# Patient Record
Sex: Female | Born: 1990 | Race: White | Hispanic: No | Smoking: Current some day smoker
Health system: Southern US, Community
[De-identification: ages and names within clinical notes are randomized; demographics above are authoritative.]

## PROBLEM LIST (undated history)

## (undated) DIAGNOSIS — R519 Headache, unspecified: Secondary | ICD-10-CM

## (undated) DIAGNOSIS — M1990 Unspecified osteoarthritis, unspecified site: Secondary | ICD-10-CM

## (undated) DIAGNOSIS — T7840XA Allergy, unspecified, initial encounter: Secondary | ICD-10-CM

## (undated) DIAGNOSIS — F419 Anxiety disorder, unspecified: Secondary | ICD-10-CM

## (undated) HISTORY — PX: WISDOM TOOTH EXTRACTION: SHX21

## (undated) HISTORY — DX: Anxiety disorder, unspecified: F41.9

## (undated) HISTORY — DX: Allergy, unspecified, initial encounter: T78.40XA

## (undated) HISTORY — PX: APPENDECTOMY: SHX54

## (undated) HISTORY — DX: Headache, unspecified: R51.9

## (undated) HISTORY — DX: Unspecified osteoarthritis, unspecified site: M19.90

---
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
## 2012-06-27 ENCOUNTER — Emergency Department: Payer: Self-pay | Admitting: Emergency Medicine

## 2012-12-10 DIAGNOSIS — O99330 Smoking (tobacco) complicating pregnancy, unspecified trimester: Secondary | ICD-10-CM | POA: Insufficient documentation

## 2012-12-10 DIAGNOSIS — Z3400 Encounter for supervision of normal first pregnancy, unspecified trimester: Secondary | ICD-10-CM | POA: Insufficient documentation

## 2012-12-10 DIAGNOSIS — F1290 Cannabis use, unspecified, uncomplicated: Secondary | ICD-10-CM | POA: Insufficient documentation

## 2016-01-06 LAB — HM HIV SCREENING LAB: HM HIV Screening: NEGATIVE

## 2016-01-17 LAB — HM PAP SMEAR: HM Pap smear: NEGATIVE

## 2016-05-02 ENCOUNTER — Encounter: Payer: Self-pay | Admitting: Emergency Medicine

## 2016-05-02 ENCOUNTER — Emergency Department
Admission: EM | Admit: 2016-05-02 | Discharge: 2016-05-02 | Disposition: A | Discharge status: Home / Self Care | Payer: Self-pay | Attending: Emergency Medicine | Admitting: Emergency Medicine

## 2016-05-02 ENCOUNTER — Emergency Department
Admission: EM | Admit: 2016-05-02 | Discharge: 2016-05-02 | Discharge status: Absent without leave | Payer: Medicaid Other

## 2016-05-02 DIAGNOSIS — R21 Rash and other nonspecific skin eruption: Secondary | ICD-10-CM | POA: Insufficient documentation

## 2016-05-02 LAB — POCT PREGNANCY, URINE: Preg Test, Ur: NEGATIVE

## 2016-05-02 MED ORDER — CLINDAMYCIN HCL 150 MG PO CAPS: ORAL_CAPSULE | ORAL | 0 refills | Status: DC

## 2016-05-02 MED ORDER — DEXAMETHASONE SODIUM PHOSPHATE 10 MG/ML IJ SOLN
10.0000 mg | Freq: Once | INTRAMUSCULAR | Status: AC
  Administered 2016-05-02: 10 mg via INTRAMUSCULAR
  Filled 2016-05-02: qty 1

## 2016-05-02 MED ORDER — DIPHENHYDRAMINE HCL 25 MG PO CAPS
50.0000 mg | ORAL_CAPSULE | Freq: Once | ORAL | Status: AC
  Administered 2016-05-02: 50 mg via ORAL
  Filled 2016-05-02: qty 2

## 2016-05-02 NOTE — Discharge Instructions (Signed)
Continue Benadryl one or 2 capsules every 6 hours as needed for itching. Begin clindamycin 2 capsules 4 times a day for 7 days. Follow-up with [facility] if any continued problems or continued rash.

## 2016-05-02 NOTE — ED Provider Notes (Signed)
[facility] Emergency Department Provider Note  ____________________________________________   None    (approximate)  I have reviewed the triage vital signs and the nursing notes.   HISTORY  Chief Complaint Rash    HPI Crystal Hansen is a 25 y.o. female is here complaining of rash to her forehead, right arm, and extremities. Patient denies any known contact with any new or unusual substances. Patient complains of itching and has been scratching at the areas without stopped. She is also concerned because the areas on her forehead have now swollen. One area on her wrist she has scratched until it is now also swollen. There is no drainage from these areas. Patient denies any fever or chills. She denies any difficulty breathing, swallowing or talking. Patient is not taking any over-the-counter medication prior to her arrival in the emergency room. She denies any pain at this time.   History reviewed. No pertinent past medical history.  There are no active problems to display for this patient.   History reviewed. No pertinent surgical history.  Prior to Admission medications   Medication Sig Start Date End Date Taking? Authorizing Provider  clindamycin (CLEOCIN) 150 MG capsule Take 2 capsules 4 times a day for 7 days. 05/02/16   Tommi Rumpshonda L Summers, PA-C    Allergies Amoxapine and related  History reviewed. No pertinent family history.  Social History Social History  Substance Use Topics  . Smoking status: Not on file  . Smokeless tobacco: Not on file  . Alcohol use Not on file    Review of Systems Constitutional: No fever/chills Eyes: No visual changes. ENT: No sore throat. Cardiovascular: Denies chest pain. Respiratory: Denies shortness of breath. Gastrointestinal:  No nausea, no vomiting.  Musculoskeletal: Negative for back pain.Negative for joint pain. Skin: Positive for rash. Neurological: Negative for headaches, focal weakness or  numbness.  10-point ROS otherwise negative.  ____________________________________________   PHYSICAL EXAM:  VITAL SIGNS: ED Triage Vitals [05/02/16 1050]  Enc Vitals Group     BP 118/63     Pulse Rate 100     Resp 18     Temp 98.1 F (36.7 C)     Temp Source Oral     SpO2 99 %     Weight 186 lb 6.4 oz (84.6 kg)     Height 5\' 5"  (1.651 m)     Head Circumference      Peak Flow      Pain Score 0     Pain Loc      Pain Edu?      Excl. in GC?     Constitutional: Alert and oriented. Well appearing and in no acute distress. Eyes: Conjunctivae are normal. PERRL. EOMI. Head: Atraumatic. Nose: No congestion/rhinnorhea. Mouth/Throat: Mucous membranes are moist.  Oropharynx non-erythematous.No edema present. Neck: No stridor.   Cardiovascular: Normal rate, regular rhythm. Grossly normal heart sounds.  Good peripheral circulation. Respiratory: Normal respiratory effort.  No retractions. Lungs CTAB. Musculoskeletal: No lower extremity tenderness nor edema.  No joint effusions. Neurologic:  Normal speech and language. No gross focal neurologic deficits are appreciated. No gait instability. Skin:  Skin is warm, dry. Multiple erythematous areas are present. There is involvement of the forehead with erythematous macular areas with no drainage. There are also circular macular areas on the extremities bilateral upper without involvement to the lower extremities. There is no drainage from any of these areas. No warmth or heat is noted. Right forearm is erythematous with macular area  open and minimal drainage present. Psychiatric: Mood and affect are normal. Speech and behavior are normal.  ____________________________________________   LABS (all labs ordered are listed, but only abnormal results are displayed)  Labs Reviewed  POC URINE PREG, ED  POCT PREGNANCY, URINE    PROCEDURES  Procedure(s) performed: None  Procedures  Critical Care performed:  No  ____________________________________________   INITIAL IMPRESSION / ASSESSMENT AND PLAN / ED COURSE  Pertinent labs & imaging results that were available during my care of the patient were reviewed by me and considered in my medical decision making (see chart for details).    Clinical Course   Patient was given Decadron 10 mg IM in the emergency room along with Benadryl 50 mg by mouth. Patient was also placed on clindamycin 2 capsules 4 times a day for 7 days as some of these areas appear to be early cellulitis secondary to patient's scratching at these areas. Patient is to follow-up with her primary care doctor or [facility] if any continued problems.  Skin eruption etiology is uncertain at this time.  ____________________________________________   FINAL CLINICAL IMPRESSION(S) / ED DIAGNOSES  Final diagnoses:  Rash and nonspecific skin eruption      NEW MEDICATIONS STARTED DURING THIS VISIT:  Discharge Medication List as of 05/02/2016 12:17 PM    START taking these medications   Details  clindamycin (CLEOCIN) 150 MG capsule Take 2 capsules 4 times a day for 7 days., Print         Note:  This document was prepared using Dragon voice recognition software and may include unintentional dictation errors.    Tommi Rumps, PA-C 05/02/16 1714    Myrna Blazer, MD 05/07/16 2110

## 2016-05-02 NOTE — ED Triage Notes (Addendum)
Patient from home via POV with complaint of rash to her forehead, right arm, BLE. Patient denies contact with new or unusual substances. Patient is actively scratching in triage

## 2017-12-06 LAB — HM HIV SCREENING LAB: HM HIV Screening: NEGATIVE

## 2019-02-15 DIAGNOSIS — F17200 Nicotine dependence, unspecified, uncomplicated: Secondary | ICD-10-CM | POA: Insufficient documentation

## 2019-02-20 ENCOUNTER — Other Ambulatory Visit: Payer: Self-pay

## 2019-02-20 ENCOUNTER — Encounter: Payer: Self-pay | Admitting: Family Medicine

## 2019-02-20 ENCOUNTER — Ambulatory Visit (INDEPENDENT_AMBULATORY_CARE_PROVIDER_SITE_OTHER): Payer: Self-pay | Admitting: Family Medicine

## 2019-02-20 VITALS — Ht 65.5 in | Wt 220.0 lb

## 2019-02-20 DIAGNOSIS — J3089 Other allergic rhinitis: Secondary | ICD-10-CM

## 2019-02-20 DIAGNOSIS — Z7689 Persons encountering health services in other specified circumstances: Secondary | ICD-10-CM

## 2019-02-20 DIAGNOSIS — F0781 Postconcussional syndrome: Secondary | ICD-10-CM

## 2019-02-20 MED ORDER — NORTRIPTYLINE HCL 25 MG PO CAPS: 25.0000 mg | ORAL_CAPSULE | Freq: Two times a day (BID) | ORAL | 0 refills | Status: DC

## 2019-02-20 MED ORDER — FLUTICASONE PROPIONATE 50 MCG/ACT NA SUSP: 1.0000 | Freq: Two times a day (BID) | NASAL | 11 refills | Status: DC

## 2019-02-20 MED ORDER — CETIRIZINE HCL 10 MG PO TABS: 10.0000 mg | ORAL_TABLET | Freq: Every day | ORAL | 11 refills | Status: DC
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
## 2019-02-23 DIAGNOSIS — J309 Allergic rhinitis, unspecified: Secondary | ICD-10-CM | POA: Insufficient documentation

## 2019-02-23 NOTE — Assessment & Plan Note (Signed)
Stable and well controlled with zyrtec and flonase regimen. Continue present medications

## 2019-02-27 ENCOUNTER — Ambulatory Visit (INDEPENDENT_AMBULATORY_CARE_PROVIDER_SITE_OTHER): Payer: Medicaid Other | Admitting: Family Medicine

## 2019-02-27 ENCOUNTER — Encounter: Payer: Self-pay | Admitting: Family Medicine

## 2019-02-27 DIAGNOSIS — F0781 Postconcussional syndrome: Secondary | ICD-10-CM

## 2019-02-27 MED ORDER — SUMATRIPTAN SUCCINATE 50 MG PO TABS: ORAL_TABLET | ORAL | 0 refills | Status: DC
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
## 2019-03-08 ENCOUNTER — Other Ambulatory Visit: Payer: Self-pay

## 2019-03-08 ENCOUNTER — Ambulatory Visit (INDEPENDENT_AMBULATORY_CARE_PROVIDER_SITE_OTHER): Payer: Self-pay | Admitting: Family Medicine

## 2019-03-08 ENCOUNTER — Encounter: Payer: Self-pay | Admitting: Family Medicine

## 2019-03-08 VITALS — BP 120/76 | HR 86 | Temp 97.7°F | Ht 65.5 in | Wt 267.0 lb

## 2019-03-08 DIAGNOSIS — F0781 Postconcussional syndrome: Secondary | ICD-10-CM

## 2019-03-08 NOTE — Progress Notes (Signed)
   BP 120/76   Pulse 86   Temp 97.7 F (36.5 C) (Oral)   Ht 5' 5.5" (1.664 m)   Wt 267 lb (121.1 kg)   SpO2 98%   BMI 43.76 kg/m    Subjective:    Patient ID: Crystal Hansen, female    DOB: 1991/02/01, 28 y.o.   MRN: [id number]  HPI: Crystal Hansen is a 28 y.o. female  Chief Complaint  Patient presents with  . Headache    f/u   Patient presents today for 1 week post-concussive HA f/u. Taking 50 mg nortriptyline at bedtime and imitrex prn which doesn't seem to be helping much. Was able to get in with Neurology this past week who agreed to continue current regimen. Imitrex was increased to 100 mg prn and she was instructed to continue complete mental rest until sxs are improving. Other than the pain, also still having some dizziness with exertion and light sensitivity. Denies N/V, syncope, confusion, memory issues.   Relevant past medical, surgical, family and social history reviewed and updated as indicated. Interim medical history since our last visit reviewed. Allergies and medications reviewed and updated.  Review of Systems  Per HPI unless specifically indicated above     Objective:    BP 120/76   Pulse 86   Temp 97.7 F (36.5 C) (Oral)   Ht 5' 5.5" (1.664 m)   Wt 267 lb (121.1 kg)   SpO2 98%   BMI 43.76 kg/m   Wt Readings from Last 3 Encounters:  03/08/19 267 lb (121.1 kg)  02/20/19 220 lb (99.8 kg)  05/02/16 186 lb 6.4 oz (84.6 kg)    Physical Exam Vitals signs and nursing note reviewed.  Constitutional:      Appearance: Normal appearance. She is not ill-appearing.  HENT:     Head: Atraumatic.  Eyes:     Extraocular Movements: Extraocular movements intact.     Conjunctiva/sclera: Conjunctivae normal.  Neck:     Musculoskeletal: Normal range of motion and neck supple.  Cardiovascular:     Rate and Rhythm: Normal rate and regular rhythm.     Heart sounds: Normal heart sounds.  Pulmonary:     Effort: Pulmonary effort is normal.     Breath  sounds: Normal breath sounds.  Musculoskeletal: Normal range of motion.  Skin:    General: Skin is warm and dry.  Neurological:     General: No focal deficit present.     Mental Status: She is alert and oriented to person, place, and time.  Psychiatric:        Mood and Affect: Mood normal.        Thought Content: Thought content normal.        Judgment: Judgment normal.     Results for orders placed or performed in visit on 02/20/19  HM HIV SCREENING LAB  Result Value Ref Range   HM HIV Screening Negative - Patient reported       Assessment & Plan:   Problem List Items Addressed This Visit    None    Visit Diagnoses    Post concussion syndrome    -  Primary   Now following with Neurology additionally. Continue current regimen, complete rest. extended work note provided. F/u in 2 weeks unless improved in meantime       Follow up plan: Return in about 2 weeks (around 03/22/2019) for HA f/u.

## 2019-03-20 ENCOUNTER — Other Ambulatory Visit: Payer: Self-pay

## 2019-03-20 ENCOUNTER — Ambulatory Visit (INDEPENDENT_AMBULATORY_CARE_PROVIDER_SITE_OTHER): Payer: Self-pay | Admitting: Family Medicine

## 2019-03-20 ENCOUNTER — Encounter: Payer: Self-pay | Admitting: Family Medicine

## 2019-03-20 DIAGNOSIS — F0781 Postconcussional syndrome: Secondary | ICD-10-CM

## 2019-03-20 MED ORDER — CYCLOBENZAPRINE HCL 10 MG PO TABS: 10.0000 mg | ORAL_TABLET | Freq: Three times a day (TID) | ORAL | 0 refills | Status: DC | PRN

## 2019-03-20 MED ORDER — MELOXICAM 15 MG PO TABS: 15.0000 mg | ORAL_TABLET | Freq: Every day | ORAL | 0 refills | Status: DC
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
## 2019-03-23 DIAGNOSIS — F0781 Postconcussional syndrome: Secondary | ICD-10-CM | POA: Insufficient documentation
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
## 2019-05-23 ENCOUNTER — Ambulatory Visit (LOCAL_COMMUNITY_HEALTH_CENTER): Payer: Medicaid Other | Admitting: Family Medicine

## 2019-05-23 ENCOUNTER — Other Ambulatory Visit: Payer: Self-pay

## 2019-05-23 VITALS — Ht 65.5 in | Wt 276.0 lb

## 2019-05-23 DIAGNOSIS — Z3046 Encounter for surveillance of implantable subdermal contraceptive: Secondary | ICD-10-CM | POA: Diagnosis not present

## 2019-05-23 DIAGNOSIS — Z3009 Encounter for other general counseling and advice on contraception: Secondary | ICD-10-CM

## 2019-05-23 NOTE — Progress Notes (Signed)
Here today for Nexplanon removal. Inserted in 01/2016 at [facility]. Not interested in alternate birth control at this time.  Hal Morales, RN

## 2019-05-23 NOTE — Progress Notes (Signed)
Family Planning Visit- Repeat Yearly Visit  Subjective:  Crystal Hansen is a 28 y.o. being seen today for an well woman visit and to discuss family planning options.    She is currently using Nexplanon for pregnancy prevention. Patient reports she does not want a pregnancy in the next year. Patient  has Allergic rhinitis and Post concussion syndrome on their problem list.  Chief Complaint  Patient presents with  . Procedure    Nexplanon removal    Does the patient desire a pregnancy in the next year? (OKQ flowsheet)  See flowsheet for other program required questions.   Body mass index is 45.23 kg/m. - Patient is eligible for diabetes screening based on BMI and age >52?  no HA1C ordered? not applicable  Patient reports 1 of partners in last year. Desires STI screening?  No - declines testing today  Does the patient have a current or past history of drug use? No   No components found for: HCV]   Health Maintenance Due  Topic Date Due  . PAP-Cervical Cytology Screening  01/17/2019  . PAP SMEAR-Modifier  01/17/2019  . INFLUENZA VACCINE  04/08/2019    Review of Systems  Constitutional: Negative for chills and fever.  Eyes: Negative for blurred vision and double vision.  Respiratory: Negative for cough and shortness of breath.   Cardiovascular: Negative for chest pain and orthopnea.  Gastrointestinal: Negative for nausea and vomiting.  Genitourinary: Negative for dysuria, flank pain and frequency.  Musculoskeletal: Negative for myalgias.  Skin: Negative for rash.  Neurological: Negative for dizziness, tingling, weakness and headaches.  Endo/Heme/Allergies: Does not bruise/bleed easily.  Psychiatric/Behavioral: Negative for depression and suicidal ideas. The patient is not nervous/anxious.     The following portions of the patient's history were reviewed and updated as appropriate: allergies, current medications, past family history, past medical history, past social  history, past surgical history and problem list. Problem list updated.  Objective:   Vitals:   05/23/19 1439  Weight: 276 lb (125.2 kg)  Height: 5' 5.5" (1.664 m)    Physical Exam Vitals signs and nursing note reviewed.  Constitutional:      Appearance: Normal appearance.  HENT:     Head: Normocephalic.  Pulmonary:     Effort: Pulmonary effort is normal.  Musculoskeletal: Normal range of motion.     Comments: Nexplanon felt in left upper arm prior to removal   Skin:    General: Skin is warm and dry.  Neurological:     Mental Status: She is alert. Mental status is at baseline.     Nexplanon Removal Patient identified, informed consent performed, consent signed.   Appropriate time out taken. Nexplanon site identified.  Area prepped in usual sterile fashon. 3 ml of 1% lidocaine with Epinephrine was used to anesthetize the area at the distal end of the implant and along implant site. A small stab incision was made right beside the implant on the distal portion.  The Nexplanon rod was grasped using hemostats and removed without difficulty.  There was minimal blood loss. There were no complications.  Steri-strips were applied over the small incision.  A pressure bandage was applied to reduce any bruising.  The patient tolerated the procedure well and was given post procedure instructions.     Assessment and Plan:  Crystal Hansen is a 28 y.o. female presenting to the [facility] Department for an initial well woman exam/family planning visit  Contraception counseling: Reviewed all forms of birth control  options available including abstinence; over the counter/barrier methods; hormonal contraceptive medication including pill, patch, ring, injection,contraceptive implant; hormonal and nonhormonal IUDs; permanent sterilization options including vasectomy and the various tubal sterilization modalities. Risks and benefits reviewed.  Questions were answered.  Written  information was also given to the patient to review.  Patient desires nothing- is ok with pregnancy if needed, this was prescribed for patient. She will follow up in  1 for surveillance.  She was told to call with any further questions, or with any concerns about this method of contraception.  Emphasized use of condoms 100% of the time for STI prevention.  1. Nexplanon removal Performed easily  2. Encounter for other general counseling or advice on contraception See above- reviewed services at [facility] [place] pap, encouraged visit for pap    Return in about 3 months (around 08/22/2019) for Yearly wellness exam, pap.  No future appointments.  Federico FlakeKimberly Niles Newton, MD

## 2019-09-06 ENCOUNTER — Ambulatory Visit: Payer: Medicaid Other

## 2019-09-06 ENCOUNTER — Other Ambulatory Visit: Payer: Self-pay

## 2019-09-06 VITALS — BP 128/76 | Ht 65.0 in | Wt 282.0 lb

## 2019-09-06 DIAGNOSIS — Z3201 Encounter for pregnancy test, result positive: Secondary | ICD-10-CM

## 2019-09-06 LAB — PREGNANCY, URINE: Preg Test, Ur: POSITIVE — AB

## 2019-09-06 MED ORDER — PRENATAL VITAMINS 28-0.8 MG PO TABS: 28.0000 mg | ORAL_TABLET | Freq: Every day | ORAL | 0 refills | Status: DC

## 2019-09-09 NOTE — Progress Notes (Signed)
Patient unsure where she will seek [facility].  Offered appt with [facility]; declined at this time. Local provider list given to patient; considering [facility] or [facility]. Richmond Campbell, RN

## 2019-09-11 NOTE — Progress Notes (Signed)
No [facility] Ameet Sandy, RN  

## 2019-10-24 ENCOUNTER — Encounter: Payer: Self-pay | Admitting: Obstetrics & Gynecology

## 2019-10-24 ENCOUNTER — Other Ambulatory Visit (HOSPITAL_COMMUNITY)
Admission: RE | Admit: 2019-10-24 | Discharge: 2019-10-24 | Disposition: A | Discharge status: Home / Self Care | Payer: Medicaid Other | Source: Ambulatory Visit | Attending: Obstetrics & Gynecology | Admitting: Obstetrics & Gynecology

## 2019-10-24 ENCOUNTER — Other Ambulatory Visit: Payer: Self-pay

## 2019-10-24 ENCOUNTER — Ambulatory Visit (INDEPENDENT_AMBULATORY_CARE_PROVIDER_SITE_OTHER): Payer: Medicaid Other | Admitting: Obstetrics & Gynecology

## 2019-10-24 VITALS — BP 128/80 | Wt 285.0 lb

## 2019-10-24 DIAGNOSIS — O0990 Supervision of high risk pregnancy, unspecified, unspecified trimester: Secondary | ICD-10-CM | POA: Insufficient documentation

## 2019-10-24 DIAGNOSIS — O99211 Obesity complicating pregnancy, first trimester: Secondary | ICD-10-CM | POA: Insufficient documentation

## 2019-10-24 DIAGNOSIS — Z124 Encounter for screening for malignant neoplasm of cervix: Secondary | ICD-10-CM | POA: Insufficient documentation

## 2019-10-24 DIAGNOSIS — Z131 Encounter for screening for diabetes mellitus: Secondary | ICD-10-CM

## 2019-10-24 DIAGNOSIS — O99212 Obesity complicating pregnancy, second trimester: Secondary | ICD-10-CM

## 2019-10-24 DIAGNOSIS — Z1379 Encounter for other screening for genetic and chromosomal anomalies: Secondary | ICD-10-CM

## 2019-10-24 DIAGNOSIS — Z3A13 13 weeks gestation of pregnancy: Secondary | ICD-10-CM | POA: Diagnosis not present

## 2019-10-24 DIAGNOSIS — O09892 Supervision of other high risk pregnancies, second trimester: Secondary | ICD-10-CM

## 2019-10-24 MED ORDER — PRENATAL VITAMINS 28-0.8 MG PO TABS: 28.0000 mg | ORAL_TABLET | Freq: Every day | ORAL | 6 refills | Status: AC

## 2019-10-24 NOTE — Patient Instructions (Signed)

## 2019-10-24 NOTE — Progress Notes (Signed)
10/24/2019   Chief Complaint: Missed period  Transfer of Care Patient: Crystal Hansen, no visit there, late onset to care  History of Present Illness: Crystal Hansen is a 29 y.o. G2P1001 [redacted]w[redacted]d based on Patient's last menstrual period was 07/23/2019 (exact date). with an Estimated Date of Delivery: 04/28/20, with the above CC.   Her periods were: regular periods every 28 days after Nexplanon removed last summer She was using no method when she conceived.  She has Positive signs or symptoms of nausea/vomiting of Hansen. She has Negative signs or symptoms of miscarriage or preterm labor She identifies Negative Zika risk factors for her and her partner On any different medications around the time she conceived/early Hansen: Yes  History of varicella: Yes   ROS: A 12-point review of systems was performed and negative, except as stated in the above HPI.  OBGYN History: As per HPI. OB History  Gravida Para Term Preterm AB Living  2 1 1     1   SAB TAB Ectopic Multiple Live Births               # Outcome Date GA Lbr Len/2nd Weight Sex Delivery Anes PTL Lv  2 Current           1 Term             Any issues with any prior pregnancies: no Any prior children are healthy, doing well, without any problems or issues: yes, NSVD after post dates IOL 7 yrs ago History of pap smears: Yes. Last pap smear unknown by pt. Abnormal: no  History of STIs: No   Past Medical History: Past Medical History:  Diagnosis Date  . Allergy   . Anxiety   . Arthritis   . Headache     Past Surgical History: Past Surgical History:  Procedure Laterality Date  . APPENDECTOMY      Family History:  Family History  Problem Relation Age of Onset  . Hernia Father   . Hypertension Father   . Lung cancer Maternal Grandmother   . Heart disease Maternal Grandfather   . Diabetes Maternal Grandfather   . Hypertension Maternal Grandfather   . Anemia Paternal Grandmother   . Prostate cancer  Paternal Grandfather    She denies any female cancers, bleeding or blood clotting disorders.  She denies any history of mental retardation, birth defects or genetic disorders in her or the FOB's history  Social History:  Social History   Socioeconomic History  . Marital status: Single    Spouse name: Not on file  . Number of children: Not on file  . Years of education: Not on file  . Highest education level: Not on file  Occupational History  . Not on file  Tobacco Use  . Smoking status: Former Smoker    Packs/day: 0.50    Types: Cigarettes  . Smokeless tobacco: Never Used  Substance and Sexual Activity  . Alcohol use: Not Currently    Comment: on occasion  . Drug use: Never  . Sexual activity: Yes  Other Topics Concern  . Not on file  Social History Narrative  . Not on file   Social Determinants of Health   Financial Resource Strain:   . Difficulty of Paying Living Expenses: Not on file  Food Insecurity:   . Worried About in the Last Year: Not on file  . Ran Out of Food in the Last Year: Not on file  Transportation Needs:   .  Lack of Transportation (Medical): Not on file  . Lack of Transportation (Non-Medical): Not on file  Physical Activity:   . Days of Exercise per Week: Not on file  . Minutes of Exercise per Session: Not on file  Stress:   . Feeling of Stress : Not on file  Social Connections:   . Frequency of Communication with Friends and Family: Not on file  . Frequency of Social Gatherings with Friends and Family: Not on file  . Attends Religious Services: Not on file  . Active Member of Clubs or Organizations: Not on file  . Attends Archivist Meetings: Not on file  . Marital Status: Not on file  Intimate Partner Violence:   . Fear of Current or Ex-Partner: Not on file  . Emotionally Abused: Not on file  . Physically Abused: Not on file  . Sexually Abused: Not on file   Any pets in the household:  no  Allergy: Allergies  Allergen Reactions  . Peanut Oil Anaphylaxis  . Shellfish Allergy Anaphylaxis  . Amoxapine And Related   . Apple Other (See Comments)  . Amoxicillin Rash    Current Outpatient Medications:  Current Outpatient Medications:  .  Butalbital-APAP-Caffeine 50-300-40 MG CAPS, Take by mouth., Disp: , Rfl:  .  Prenatal Vit-Fe Fumarate-FA (PRENATAL VITAMINS) 28-0.8 MG TABS, Take 28 mg by mouth daily., Disp: 90 tablet, Rfl: 6   Physical Exam:   BP 128/80   Wt 285 lb (129.3 kg)   LMP 07/23/2019 (Exact Date)   BMI 47.43 kg/m  Body mass index is 47.43 kg/m. Constitutional: Well nourished, well developed female in no acute distress.  Neck:  Supple, normal appearance, and no thyromegaly  Cardiovascular: S1, S2 normal, no murmur, rub or gallop, regular rate and rhythm Respiratory:  Clear to auscultation bilateral. Normal respiratory effort Abdomen: positive bowel sounds and no masses, hernias; diffusely non tender to palpation, non distended Breasts: breasts appear normal, no suspicious masses, no skin or nipple changes or axillary nodes. Neuro/Psych:  Normal mood and affect.  Skin:  Warm and dry.  Lymphatic:  No inguinal lymphadenopathy.  FHT 150s   Pelvic exam: is limited by body habitus EGBUS: within normal limits, Vagina: within normal limits and with no blood in the vault, Cervix: normal appearing cervix without discharge or lesions, closed/long/high, Uterus:  enlarged: 12 weeks, and Adnexa:  normal adnexa and no mass, fullness, tenderness  Assessment: Crystal Hansen is a 29 y.o. G2P1001 [redacted]w[redacted]d based on Patient's last menstrual period was 07/23/2019 (exact date). with an Estimated Date of Delivery: 04/28/20,  for prenatal care.  Plan:  1) Avoid alcoholic beverages. 2) Patient encouraged not to smoke.  3) Discontinue the use of all non-medicinal drugs and chemicals.  4) Take prenatal vitamins daily.  5) Seatbelt use advised 6) Nutrition, food safety (fish,  cheese advisories, and high nitrite foods) and exercise discussed. 7) Hospital and practice style delivering at [facility] discussed  8) Patient is asked about travel to areas at risk for the [place] virus, and counseled to avoid travel and exposure to mosquitoes or sexual partners who may have themselves been exposed to the virus. Testing is discussed, and will be ordered as appropriate.  9) Childbirth classes at [facility] advised 10) Genetic Screening, such as with 1st Trimester Screening, cell free fetal DNA, AFP testing, and Ultrasound, as well as with amniocentesis and [facility] as appropriate, is discussed with patient. She plans to have genetic testing this Hansen. 11) Obesity RF discussed Glucola, [place] soon  Problem list reviewed and updated.  Annamarie Major, MD, [facility], [facility] 10/24/2019  8:29 AM

## 2019-10-25 ENCOUNTER — Other Ambulatory Visit: Payer: Self-pay

## 2019-10-25 ENCOUNTER — Ambulatory Visit (INDEPENDENT_AMBULATORY_CARE_PROVIDER_SITE_OTHER): Payer: Medicaid Other | Admitting: Obstetrics & Gynecology

## 2019-10-25 ENCOUNTER — Encounter: Payer: Self-pay | Admitting: Obstetrics & Gynecology

## 2019-10-25 ENCOUNTER — Other Ambulatory Visit: Payer: Self-pay | Admitting: Obstetrics & Gynecology

## 2019-10-25 ENCOUNTER — Ambulatory Visit (INDEPENDENT_AMBULATORY_CARE_PROVIDER_SITE_OTHER): Payer: Medicaid Other

## 2019-10-25 ENCOUNTER — Other Ambulatory Visit: Payer: Medicaid Other

## 2019-10-25 VITALS — BP 140/80 | Wt 284.0 lb

## 2019-10-25 DIAGNOSIS — O30042 Twin pregnancy, dichorionic/diamniotic, second trimester: Secondary | ICD-10-CM | POA: Diagnosis not present

## 2019-10-25 DIAGNOSIS — O30049 Twin pregnancy, dichorionic/diamniotic, unspecified trimester: Secondary | ICD-10-CM

## 2019-10-25 DIAGNOSIS — O99211 Obesity complicating pregnancy, first trimester: Secondary | ICD-10-CM

## 2019-10-25 DIAGNOSIS — Z3A13 13 weeks gestation of pregnancy: Secondary | ICD-10-CM | POA: Diagnosis not present

## 2019-10-25 DIAGNOSIS — Z131 Encounter for screening for diabetes mellitus: Secondary | ICD-10-CM

## 2019-10-25 DIAGNOSIS — O0990 Supervision of high risk pregnancy, unspecified, unspecified trimester: Secondary | ICD-10-CM

## 2019-10-25 DIAGNOSIS — O09891 Supervision of other high risk pregnancies, first trimester: Secondary | ICD-10-CM

## 2019-10-25 DIAGNOSIS — Z1379 Encounter for other screening for genetic and chromosomal anomalies: Secondary | ICD-10-CM

## 2019-10-25 DIAGNOSIS — O30041 Twin pregnancy, dichorionic/diamniotic, first trimester: Secondary | ICD-10-CM

## 2019-10-25 LAB — CYTOLOGY - PAP
Chlamydia: NEGATIVE
Comment: NEGATIVE
Comment: NORMAL
Diagnosis: NEGATIVE
Neisseria Gonorrhea: NEGATIVE

## 2019-10-25 NOTE — Patient Instructions (Signed)
National Guideline Alliance ([place]).Twin and Triplet Pregnancy. [place]: National Institute for Health and Care Excellence (UK); 2019.">  Multiple Pregnancy Multiple pregnancy means that a woman is carrying more than one baby at a time. She may be pregnant with twins, triplets, or more. The majority of multiple pregnancies are twins. Naturally conceiving triplets or more (higher-order multiples) is rare. Multiple pregnancies are riskier than single pregnancies. A woman with a multiple pregnancy is more likely to have certain problems during her pregnancy. How does a multiple pregnancy happen? A multiple pregnancy happens when:  The woman's body releases more than one egg at a time, and then each egg gets fertilized by a different sperm. ? This is the most common type of multiple pregnancy. ? Twins or other multiples produced this way are called fraternal. They are no more alike than non-multiple siblings are.  One sperm fertilizes one egg, which then divides into more than one embryo. ? Twins or other multiples produced this way are called identical. Identical multiples are always the same gender, and they look very much alike. Who is most likely to have a multiple pregnancy? A multiple pregnancy is more likely to develop in women who:  Have had fertility treatment, especially if the treatment included fertility medicines.  Are older than 28 years of age.  Have already had four or more children.  Have a family history of multiple pregnancy. How is a multiple pregnancy diagnosed? A multiple pregnancy may be diagnosed based on:  Symptoms such as: ? Rapid weight gain in the first 3 months of pregnancy (first trimester). ? More severe nausea and breast tenderness than what is typical of a single pregnancy. ? A larger uterus than what is normal for the stage of the pregnancy.  Blood tests that detect a higher-than-normal level of human chorionic gonadotropin (hCG). This is a hormone that your  body produces in early pregnancy.  An ultrasound exam. This is used to confirm that you are carrying multiples. What risks come with multiple pregnancy? A multiple pregnancy puts you at a higher risk for certain problems during or after your pregnancy. These include:  Delivering your babies before your due date (preterm birth). A full-term pregnancy lasts for at least 37 weeks. ? Babies born before 37 weeks may have a higher risk for breathing problems, feeding difficulties, cerebral palsy, and learning disabilities.  Diabetes.  Preeclampsia. This is a serious condition that causes high blood pressure and headaches during pregnancy.  Too much blood loss after childbirth (postpartum hemorrhage).  Postpartum depression.  Low birth weight of the babies. How will having a multiple pregnancy affect my care? Your health care team will monitor you more closely. You may need more frequent prenatal visits. This will ensure that you are healthy and that your babies are growing normally. Follow these instructions at home: Eating and drinking  Increase your nutrition. ? Follow your health care provider's recommendations for weight gain. You may need to gain a little extra weight when you are pregnant with multiples. ? Eat healthy snacks often throughout the day. This will add calories and reduce nausea.  Drink enough fluid to keep your urine pale yellow.  Take prenatal vitamins. Ask your health care provider what vitamins are right for you. Activity Limit your activities by 20-24 weeks of pregnancy.  Rest often.  Avoid activities, exercise, and work that take a lot of effort.  Ask your health care provider when you should stop having sex. General instructions  Do not use any   products that contain nicotine or tobacco, such as cigarettes, e-cigarettes, and chewing tobacco. If you need help quitting, ask your health care provider.  Do not drink alcohol or use illegal drugs.  Take  over-the-counter and prescription medicines only as told by your health care provider.  Arrange for extra help around the house.  Keep all follow-up visits and all prenatal visits as told by your health care provider. This is important. Where to find more information  American College of Obstetricians and Gynecology: www.acog.org Contact a health care provider if:  You have dizziness.  You have nausea, vomiting, or diarrhea that does not go away.  You have depression or other emotions that are interfering with your normal activities.  You have a fever.  You have pain with urination.  You have a bad-smelling vaginal discharge.  You notice increased swelling in your face, hands, legs, or ankles. Get help right away if:  You have fluid leaking from your vagina.  You have bleeding from your vagina.  You have pelvic cramps, pelvic pressure, or nagging pain in your abdomen or lower back.  You are having regular contractions.  You have a severe headache, with or without changes in how you see.  You have chest pain or shortness of breath.  You notice that your babies move less often, or do not move at all. Summary  Having a multiple pregnancy means that a woman is carrying more than one baby at a time.  A multiple pregnancy puts you at a higher risk for delivering your babies before your due date, having diabetes, preeclampsia, too much blood loss after childbirth, or low birth weight of the babies.  Your health care provider will monitor you more closely during your pregnancy.  You may need to make some lifestyle changes during pregnancy. This includes eating more, limiting your activities after 20-24 weeks of pregnancy, and arranging for extra help around the house.  Follow up with your health care provider as instructed if you experience any complications. This information is not intended to replace advice given to you by your health care provider. Make sure you discuss  any questions you have with your health care provider. Document Revised: 04/17/2019 Document Reviewed: 04/17/2019 Elsevier Patient Education  2020 Elsevier Inc.  

## 2019-10-25 NOTE — Progress Notes (Signed)
  Subjective  Fetal Movement? no Nausea? no Vaginal Bleeding? no Review of ULTRASOUND.    I have personally reviewed images and report of recent ultrasound done at [facility].    Plan of management to be discussed with patient. TWINS dx today by [place], di-di  Objective  BP 140/80   Wt 284 lb (128.8 kg)   LMP 07/23/2019 (Exact Date)   BMI 47.26 kg/m  General: NAD Pumonary: no increased work of breathing Abdomen: gravid, non-tender Extremities: no edema Psychiatric: mood appropriate, affect full  Assessment  29 y.o. G2P1001 at [redacted]w[redacted]d by  04/28/2020, by Last Menstrual Period presenting for routine prenatal visit  Plan   Problem List Items Addressed This Visit      Other   Supervision of high risk pregnancy, antepartum   Obesity affecting pregnancy in first trimester   Relevant Orders   EKG   Twin dichorionic diamniotic placenta    Discussed risk factors and prenatal care surveillance    NIPT done, discussed limitations w twins   [redacted] weeks gestation of pregnancy        -  PNV     Annamarie Major, MD, [facility], [facility] 10/25/2019  11:21 AM

## 2019-10-26 ENCOUNTER — Telehealth: Payer: Self-pay | Admitting: Obstetrics & Gynecology

## 2019-10-26 LAB — RPR+RH+ABO+RUB AB+AB SCR+CB...
Antibody Screen: NEGATIVE
HIV Screen 4th Generation wRfx: NONREACTIVE
Hematocrit: 36 % (ref 34.0–46.6)
Hemoglobin: 12.5 g/dL (ref 11.1–15.9)
Hepatitis B Surface Ag: NEGATIVE
MCH: 28.7 pg (ref 26.6–33.0)
MCHC: 34.7 g/dL (ref 31.5–35.7)
MCV: 83 fL (ref 79–97)
Platelets: 310 10*3/uL (ref 150–450)
RBC: 4.35 x10E6/uL (ref 3.77–5.28)
RDW: 12.8 % (ref 11.7–15.4)
RPR Ser Ql: NONREACTIVE
Rh Factor: POSITIVE
Rubella Antibodies, IGG: <0.9 index — ABNORMAL LOW (ref >0.99)
Varicella zoster IgG: 1010 index (ref >165)
WBC: 12 10*3/uL — ABNORMAL HIGH (ref 3.4–10.8)

## 2019-10-26 LAB — URINE CULTURE

## 2019-10-26 LAB — GLUCOSE, 1 HOUR GESTATIONAL: Gestational Diabetes Screen: 96 mg/dL (ref 65–139)

## 2019-10-26 NOTE — Telephone Encounter (Signed)
-----   Message from Lewis Moccasin sent at 10/25/2019  3:18 PM EST ----- Regarding: FW: sch EKG Please schedule the EKG for this patient. ----- Message ----- From: Nadara Mustard, MD Sent: 10/25/2019  11:13 AM EST To: Lewis Moccasin, April H Hawley Subject: sch EKG

## 2019-10-26 NOTE — Telephone Encounter (Signed)
Called Specialty scheduling over at [facility] had to leave a voicemail for them to call me back to get patient schedule

## 2019-10-27 ENCOUNTER — Telehealth: Payer: Self-pay | Admitting: Obstetrics & Gynecology

## 2019-10-27 NOTE — Telephone Encounter (Signed)
Patient is aware EKG schedule at [facility] on Monday, 10/30/19 at 10 am

## 2019-10-27 NOTE — Telephone Encounter (Signed)
-----   Message from Lewis Moccasin sent at 10/26/2019  4:21 PM EST ----- Regarding: FW: sch EKG Please schedule the patient's EKG. Thanks. ----- Message ----- From: Nadara Mustard, MD Sent: 10/25/2019  11:13 AM EST To: Lewis Moccasin, April H Hawley Subject: sch EKG

## 2019-10-30 ENCOUNTER — Other Ambulatory Visit: Payer: Self-pay

## 2019-10-30 ENCOUNTER — Ambulatory Visit
Admission: RE | Admit: 2019-10-30 | Discharge: 2019-10-30 | Disposition: A | Discharge status: Home / Self Care | Payer: Medicaid Other | Source: Ambulatory Visit | Attending: Obstetrics & Gynecology | Admitting: Obstetrics & Gynecology

## 2019-10-30 DIAGNOSIS — O99211 Obesity complicating pregnancy, first trimester: Secondary | ICD-10-CM | POA: Insufficient documentation

## 2019-10-30 DIAGNOSIS — Z3A00 Weeks of gestation of pregnancy not specified: Secondary | ICD-10-CM | POA: Insufficient documentation

## 2019-10-30 DIAGNOSIS — E669 Obesity, unspecified: Secondary | ICD-10-CM | POA: Diagnosis not present

## 2019-11-08 ENCOUNTER — Other Ambulatory Visit: Payer: Self-pay | Admitting: Obstetrics & Gynecology

## 2019-11-08 DIAGNOSIS — Z1379 Encounter for other screening for genetic and chromosomal anomalies: Secondary | ICD-10-CM

## 2019-11-09 ENCOUNTER — Other Ambulatory Visit: Payer: Self-pay

## 2019-11-09 ENCOUNTER — Ambulatory Visit (INDEPENDENT_AMBULATORY_CARE_PROVIDER_SITE_OTHER): Payer: Medicaid Other | Admitting: Certified Nurse Midwife

## 2019-11-09 VITALS — BP 118/64 | Wt 287.0 lb

## 2019-11-09 DIAGNOSIS — O30049 Twin pregnancy, dichorionic/diamniotic, unspecified trimester: Secondary | ICD-10-CM

## 2019-11-09 DIAGNOSIS — O0992 Supervision of high risk pregnancy, unspecified, second trimester: Secondary | ICD-10-CM

## 2019-11-09 DIAGNOSIS — Z3A15 15 weeks gestation of pregnancy: Secondary | ICD-10-CM

## 2019-11-09 DIAGNOSIS — O0990 Supervision of high risk pregnancy, unspecified, unspecified trimester: Secondary | ICD-10-CM

## 2019-11-09 DIAGNOSIS — Z1379 Encounter for other screening for genetic and chromosomal anomalies: Secondary | ICD-10-CM

## 2019-11-09 DIAGNOSIS — O30042 Twin pregnancy, dichorionic/diamniotic, second trimester: Secondary | ICD-10-CM

## 2019-11-09 LAB — POCT URINALYSIS DIPSTICK OB
Glucose, UA: NEGATIVE
POC,PROTEIN,UA: NEGATIVE

## 2019-11-09 NOTE — Progress Notes (Signed)
C/o feet swelling - adv good supportive tennis shoes, drink water, watch salt intake; needs MaterniT 21 redrawn d/t twins.rj

## 2019-11-10 ENCOUNTER — Other Ambulatory Visit: Payer: Self-pay | Admitting: Obstetrics & Gynecology

## 2019-11-14 LAB — MATERNIT 21 PLUS CORE, BLOOD
Fetal Fraction: 5
Result (T21): NEGATIVE
Trisomy 13 (Patau syndrome): NEGATIVE
Trisomy 18 (Edwards syndrome): NEGATIVE
Trisomy 21 (Down syndrome): NEGATIVE

## 2019-11-20 ENCOUNTER — Telehealth: Payer: Self-pay

## 2019-11-20 NOTE — Telephone Encounter (Signed)
Rep from [facility] left msg on triage line wanting to confirm we had received fax redraw request for pt. Called number given and was advised by lab rep that they dont see anything pending for this request, the most recent results they have are from second draw in March. I was told they apologize for the confusion and to disregard msg.

## 2019-11-26 LAB — MATERNIT21 PLUS CORE+SCA
Fetal Fraction: 5
Monosomy X (Turner Syndrome): NOT DETECTED
Trisomy 13 (Patau syndrome): NEGATIVE
Trisomy 18 (Edwards syndrome): NEGATIVE
Trisomy 21 (Down syndrome): NEGATIVE
XXX (Triple X Syndrome): NOT DETECTED
XXY (Klinefelter Syndrome): NOT DETECTED
XYY (Jacobs Syndrome): NOT DETECTED

## 2019-12-05 ENCOUNTER — Encounter: Payer: Self-pay | Admitting: Certified Nurse Midwife

## 2019-12-05 NOTE — Progress Notes (Signed)
Twin gestation at 15wk4d: Doing well. Needs another MaterniT 21 drawn reflecting twin gestation. Ultrasound today: Both fetuses moving well and normal FCA 147 and 144 NOB labs reviewed Early glucola was normal (96) A: Twin gestation at 15wk4d  P: MaterniT 21 drawn ROB in 4 weeks for anatomy scan on twins  Farrel Conners, [place]

## 2019-12-11 ENCOUNTER — Ambulatory Visit (INDEPENDENT_AMBULATORY_CARE_PROVIDER_SITE_OTHER): Payer: Medicaid Other

## 2019-12-11 ENCOUNTER — Ambulatory Visit (INDEPENDENT_AMBULATORY_CARE_PROVIDER_SITE_OTHER): Payer: Medicaid Other | Admitting: Obstetrics and Gynecology

## 2019-12-11 ENCOUNTER — Other Ambulatory Visit: Payer: Self-pay | Admitting: Obstetrics and Gynecology

## 2019-12-11 ENCOUNTER — Other Ambulatory Visit: Payer: Self-pay

## 2019-12-11 ENCOUNTER — Encounter: Payer: Self-pay | Admitting: Obstetrics and Gynecology

## 2019-12-11 VITALS — BP 120/80 | Wt 293.0 lb

## 2019-12-11 DIAGNOSIS — Z3A20 20 weeks gestation of pregnancy: Secondary | ICD-10-CM | POA: Diagnosis not present

## 2019-12-11 DIAGNOSIS — Z3689 Encounter for other specified antenatal screening: Secondary | ICD-10-CM

## 2019-12-11 DIAGNOSIS — O99212 Obesity complicating pregnancy, second trimester: Secondary | ICD-10-CM

## 2019-12-11 DIAGNOSIS — O30049 Twin pregnancy, dichorionic/diamniotic, unspecified trimester: Secondary | ICD-10-CM

## 2019-12-11 DIAGNOSIS — O30009 Twin pregnancy, unspecified number of placenta and unspecified number of amniotic sacs, unspecified trimester: Secondary | ICD-10-CM

## 2019-12-11 DIAGNOSIS — O0990 Supervision of high risk pregnancy, unspecified, unspecified trimester: Secondary | ICD-10-CM

## 2019-12-11 DIAGNOSIS — O99211 Obesity complicating pregnancy, first trimester: Secondary | ICD-10-CM

## 2019-12-11 DIAGNOSIS — O30042 Twin pregnancy, dichorionic/diamniotic, second trimester: Secondary | ICD-10-CM

## 2019-12-11 DIAGNOSIS — O0992 Supervision of high risk pregnancy, unspecified, second trimester: Secondary | ICD-10-CM

## 2019-12-11 LAB — POCT URINALYSIS DIPSTICK OB
Glucose, UA: NEGATIVE
POC,PROTEIN,UA: NEGATIVE

## 2019-12-11 MED ORDER — PRENATAL VITAMINS 28-0.8 MG PO TABS: 1.0000 | ORAL_TABLET | Freq: Every day | ORAL | 11 refills | Status: DC

## 2019-12-11 NOTE — Progress Notes (Signed)
Routine Prenatal Care Visit  Subjective  Crystal Hansen is a 29 y.o. G2P1001 at [redacted]w[redacted]d being seen today for ongoing prenatal care.  She is currently monitored for the following issues for this high-risk pregnancy and has Allergic rhinitis; Post concussion syndrome; Supervision of high risk pregnancy, antepartum; Obesity affecting pregnancy in first trimester; and Twin dichorionic diamniotic placenta on their problem list.  ----------------------------------------------------------------------------------- Patient reports no complaints.   Contractions: Irregular. Vag. Bleeding: None.  Movement: Present. Denies leaking of fluid.  ----------------------------------------------------------------------------------- The following portions of the patient's history were reviewed and updated as appropriate: allergies, current medications, past family history, past medical history, past social history, past surgical history and problem list. Problem list updated.   Objective  Blood pressure 120/80, weight 293 lb (132.9 kg), last menstrual period 07/23/2019. Pregravid weight 260 lb (117.9 kg) Total Weight Gain 33 lb (15 kg) Urinalysis:      Fetal Status: Fetal Heart Rate (bpm): 137/142   Movement: Present     General:  Alert, oriented and cooperative. Patient is in no acute distress.  Skin: Skin is warm and dry. No rash noted.   Cardiovascular: Normal heart rate noted  Respiratory: Normal respiratory effort, no problems with respiration noted  Abdomen: Soft, gravid, appropriate for gestational age. Pain/Pressure: Absent     Pelvic:  Cervical exam deferred        Extremities: Normal range of motion.     Mental Status: Normal mood and affect. Normal behavior. Normal judgment and thought content.    Assessment   29 y.o. G2P1001 at [redacted]w[redacted]d by  04/28/2020, by Last Menstrual Period presenting for routine prenatal visit  Plan   pregnancy2  Problems (from 07/23/19 to present)    Problem  Noted Resolved   Twin dichorionic diamniotic placenta 10/25/2019 by Gae Dry, MD No   Overview Addendum 12/11/2019 12:19 PM by Homero Fellers, MD    Risks discussed Monitor for PTL        Supervision of high risk pregnancy, antepartum 10/24/2019 by Gae Dry, MD No   Overview Addendum 12/11/2019 12:20 PM by Homero Fellers, [facility] Prenatal Labs  Dating 13 weeks, Twins Blood type: A/Positive/-- (02/17 1052)   Genetic Screen [place]lia Antibody:Negative (02/17 1052)  Anatomic [place] Normal, need PCI to complete[ ]    Rubella: <0.90 (02/17 1052) Varicella Immune  GTT Early:  96             Third trimester:  RPR: Non Reactive (02/17 1052)   Rhogam  not needed HBsAg: Negative (02/17 1052)   TDaP vaccine                       Flu Shot: HIV: Non Reactive (02/17 1052)   Baby Food                                GBS:   Contraception  Pap:10/24/19  CBB  No   CS/VBAC n/a   Support Person              Patient considering cesarean section for delivery. She is interested in having this at [facility]. Discussed transfer of care if that is her desire.   Gestational age appropriate obstetric precautions including but not limited to vaginal bleeding, contractions, leaking of fluid and fetal movement were reviewed in detail with the patient.    Return in about  4 weeks (around 01/08/2020) for ROB and growth/anatomy follow up US.  Natale Milch MD [facility], [facility] 12/11/2019, 12:21 PM

## 2019-12-11 NOTE — Patient Instructions (Signed)

## 2019-12-11 NOTE — Progress Notes (Signed)
ROB/Anatomy- no concerns °

## 2020-01-15 ENCOUNTER — Ambulatory Visit (INDEPENDENT_AMBULATORY_CARE_PROVIDER_SITE_OTHER): Payer: Medicaid Other

## 2020-01-15 ENCOUNTER — Other Ambulatory Visit: Payer: Self-pay | Admitting: Obstetrics & Gynecology

## 2020-01-15 ENCOUNTER — Other Ambulatory Visit: Payer: Self-pay

## 2020-01-15 ENCOUNTER — Ambulatory Visit (INDEPENDENT_AMBULATORY_CARE_PROVIDER_SITE_OTHER): Payer: Medicaid Other | Admitting: Obstetrics & Gynecology

## 2020-01-15 VITALS — BP 122/80 | Wt 302.0 lb

## 2020-01-15 DIAGNOSIS — Z131 Encounter for screening for diabetes mellitus: Secondary | ICD-10-CM

## 2020-01-15 DIAGNOSIS — Z3A24 24 weeks gestation of pregnancy: Secondary | ICD-10-CM | POA: Diagnosis not present

## 2020-01-15 DIAGNOSIS — O30042 Twin pregnancy, dichorionic/diamniotic, second trimester: Secondary | ICD-10-CM | POA: Diagnosis not present

## 2020-01-15 DIAGNOSIS — O0990 Supervision of high risk pregnancy, unspecified, unspecified trimester: Secondary | ICD-10-CM

## 2020-01-15 DIAGNOSIS — Z3A25 25 weeks gestation of pregnancy: Secondary | ICD-10-CM

## 2020-01-15 DIAGNOSIS — O30049 Twin pregnancy, dichorionic/diamniotic, unspecified trimester: Secondary | ICD-10-CM

## 2020-01-15 DIAGNOSIS — O99211 Obesity complicating pregnancy, first trimester: Secondary | ICD-10-CM

## 2020-01-15 DIAGNOSIS — O0992 Supervision of high risk pregnancy, unspecified, second trimester: Secondary | ICD-10-CM

## 2020-01-15 NOTE — Patient Instructions (Signed)

## 2020-01-15 NOTE — Progress Notes (Signed)
  Subjective  Fetal Movement? yes Contractions? no Leaking Fluid? no Vaginal Bleeding? no Low back pain  Objective  BP 122/80   Wt (!) 302 lb (137 kg)   LMP 07/23/2019 (Exact Date)   BMI 50.26 kg/m  General: NAD Pumonary: no increased work of breathing Abdomen: gravid, non-tender Extremities: no edema Psychiatric: mood appropriate, affect full  Assessment  29 y.o. G2P1001 at [redacted]w[redacted]d by  04/28/2020, by Last Menstrual Period presenting for routine prenatal visit  Plan   Problem List Items Addressed This Visit      Other   Supervision of high risk pregnancy, antepartum   Obesity affecting pregnancy in first trimester   Twin dichorionic diamniotic placenta    Other Visit Diagnoses    Screening for diabetes mellitus    -  Primary   Relevant Orders   28 Week RH+Panel   [redacted] weeks gestation of pregnancy          pregnancy2  Problems (from 07/23/19 to present)    Problem Noted Resolved   Twin dichorionic diamniotic placenta 10/25/2019 by Nadara Mustard, MD No   Overview Addendum 12/11/2019 12:19 PM by Natale Milch, MD    Risks discussed Monitor for PTL        Supervision of high risk pregnancy, antepartum 10/24/2019 by Nadara Mustard, MD No   Overview Addendum 12/11/2019 12:20 PM by Natale Milch, MD    Clinic [facility] Prenatal Labs  Dating 13 weeks, Twins Blood type: A/Positive/-- (02/17 1052)   Genetic Screen NIPS:diploid Antibody:Negative (02/17 1052)  Anatomic [place] Normal Rubella: <0.90 (02/17 1052) Varicella Immune  GTT Early:  96             Third trimester:  RPR: Non Reactive (02/17 1052)   Rhogam  not needed HBsAg: Negative (02/17 1052)   TDaP vaccine                       Flu Shot: HIV: Non Reactive (02/17 1052)   Baby Food                                GBS:   Contraception  Pap:10/24/19  CBB  No   CS/VBAC n/a   Support Person             Glucola nv [place] growth third trimester (twins) PTL precautions discussed BMI is >50 now    Anes  consult; plan to schedule in third trimester    Delivery risks discussed, both w BMI and w twins    Desires vag delivery (prior 2 NSVD)   Annamarie Major, MD, [facility], [facility] 01/15/2020  10:31 AM

## 2020-02-06 ENCOUNTER — Other Ambulatory Visit: Payer: Self-pay

## 2020-02-06 ENCOUNTER — Other Ambulatory Visit: Payer: Medicaid Other

## 2020-02-06 ENCOUNTER — Ambulatory Visit (INDEPENDENT_AMBULATORY_CARE_PROVIDER_SITE_OTHER): Payer: Medicaid Other | Admitting: Advanced Practice Midwife

## 2020-02-06 ENCOUNTER — Encounter: Payer: Self-pay | Admitting: Advanced Practice Midwife

## 2020-02-06 VITALS — BP 124/84 | Wt 315.0 lb

## 2020-02-06 DIAGNOSIS — O30049 Twin pregnancy, dichorionic/diamniotic, unspecified trimester: Secondary | ICD-10-CM

## 2020-02-06 DIAGNOSIS — O0992 Supervision of high risk pregnancy, unspecified, second trimester: Secondary | ICD-10-CM

## 2020-02-06 DIAGNOSIS — Z3A28 28 weeks gestation of pregnancy: Secondary | ICD-10-CM | POA: Diagnosis not present

## 2020-02-06 LAB — POCT URINALYSIS DIPSTICK OB
Glucose, UA: NEGATIVE
POC,PROTEIN,UA: NEGATIVE

## 2020-02-06 NOTE — Progress Notes (Signed)
No vb. No lof. Swelling in feet. Glucose today.

## 2020-02-06 NOTE — Progress Notes (Signed)
Routine Prenatal Care Visit  Subjective  Crystal Hansen is a 29 y.o. G2P1001 at [redacted]w[redacted]d being seen today for ongoing prenatal care.  She is currently monitored for the following issues for this high-risk pregnancy and has Allergic rhinitis; Post concussion syndrome; Supervision of high risk pregnancy, antepartum; Obesity affecting pregnancy in first trimester; and Twin dichorionic diamniotic placenta on their problem list.  ----------------------------------------------------------------------------------- Patient reports swelling in her feet. It has helped to elevate. She reports adequate hydration.   Contractions: Not present. Vag. Bleeding: None.  Movement: Present. Leaking Fluid denies.  ----------------------------------------------------------------------------------- The following portions of the patient's history were reviewed and updated as appropriate: allergies, current medications, past family history, past medical history, past social history, past surgical history and problem list. Problem list updated.  Objective  Blood pressure 124/84, weight (!) 315 lb (142.9 kg), last menstrual period 07/23/2019. Pregravid weight 260 lb (117.9 kg) Total Weight Gain 55 lb (24.9 kg) Urinalysis: Urine Protein Negative  Urine Glucose Negative  Fetal Status: Fetal Heart Rate (bpm): 154/141   Movement: Present     General:  Alert, oriented and cooperative. Patient is in no acute distress.  Skin: Skin is warm and dry. No rash noted.   Cardiovascular: Normal heart rate noted  Respiratory: Normal respiratory effort, no problems with respiration noted  Abdomen: Soft, gravid, appropriate for gestational age. Pain/Pressure: Present     Pelvic:  Cervical exam deferred        Extremities: Normal range of motion.  Edema: Mild pitting, slight indentation  Mental Status: Normal mood and affect. Normal behavior. Normal judgment and thought content.   Assessment   29 y.o. G2P1001 at [redacted]w[redacted]d by   04/28/2020, by Last Menstrual Period presenting for routine prenatal visit  Plan   pregnancy2  Problems (from 07/23/19 to present)    Problem Noted Resolved   Twin dichorionic diamniotic placenta 10/25/2019 by Nadara Mustard, MD No   Overview Addendum 12/11/2019 12:19 PM by Natale Milch, MD    Risks discussed Monitor for PTL        Supervision of high risk pregnancy, antepartum 10/24/2019 by Nadara Mustard, MD No   Overview Addendum 12/11/2019 12:20 PM by Natale Milch, MD    Clinic [facility] Prenatal Labs  Dating 13 weeks, Twins Blood type: A/Positive/-- (02/17 1052)   Genetic Screen NIPS:diploid Antibody:Negative (02/17 1052)  Anatomic [place] Normal, need PCI to complete[ ]    Rubella: <0.90 (02/17 1052) Varicella Immune  GTT Early:  96             Third trimester:  RPR: Non Reactive (02/17 1052)   Rhogam  not needed HBsAg: Negative (02/17 1052)   TDaP vaccine                       Flu Shot: HIV: Non Reactive (02/17 1052)   Baby Food                                GBS:   Contraception  Pap:10/24/19  CBB  No   CS/VBAC n/a   Support Person             28 week labs today   Preterm labor symptoms and general obstetric precautions including but not limited to vaginal bleeding, contractions, leaking of fluid and fetal movement were reviewed in detail with the patient. Please refer to After Visit Summary for other counseling recommendations.  Return in about 2 weeks (around 02/20/2020) for growth scan and rob with MD.  Rod Can, CNM 02/06/2020 9:32 AM

## 2020-02-06 NOTE — Patient Instructions (Signed)
Third Trimester of Pregnancy The third trimester is from week 28 through week 40 (months 7 through 9). The third trimester is a time when the unborn baby (fetus) is growing rapidly. At the end of the ninth month, the fetus is about 20 inches in length and weighs 6-10 pounds. Body changes during your third trimester Your body will continue to go through many changes during pregnancy. The changes vary from woman to woman. During the third trimester:  Your weight will continue to increase. You can expect to gain 25-35 pounds (11-16 kg) by the end of the pregnancy.  You may begin to get stretch marks on your hips, abdomen, and breasts.  You may urinate more often because the fetus is moving lower into your pelvis and pressing on your bladder.  You may develop or continue to have heartburn. This is caused by increased hormones that slow down muscles in the digestive tract.  You may develop or continue to have constipation because increased hormones slow digestion and cause the muscles that push waste through your intestines to relax.  You may develop hemorrhoids. These are swollen veins (varicose veins) in the rectum that can itch or be painful.  You may develop swollen, bulging veins (varicose veins) in your legs.  You may have increased body aches in the pelvis, back, or thighs. This is due to weight gain and increased hormones that are relaxing your joints.  You may have changes in your hair. These can include thickening of your hair, rapid growth, and changes in texture. Some women also have hair loss during or after pregnancy, or hair that feels dry or thin. Your hair will most likely return to normal after your baby is born.  Your breasts will continue to grow and they will continue to become tender. A yellow fluid (colostrum) may leak from your breasts. This is the first milk you are producing for your baby.  Your belly button may stick out.  You may notice more swelling in your hands,  face, or ankles.  You may have increased tingling or numbness in your hands, arms, and legs. The skin on your belly may also feel numb.  You may feel short of breath because of your expanding uterus.  You may have more problems sleeping. This can be caused by the size of your belly, increased need to urinate, and an increase in your body's metabolism.  You may notice the fetus "dropping," or moving lower in your abdomen (lightening).  You may have increased vaginal discharge.  You may notice your joints feel loose and you may have pain around your pelvic bone. What to expect at prenatal visits You will have prenatal exams every 2 weeks until week 36. Then you will have weekly prenatal exams. During a routine prenatal visit:  You will be weighed to make sure you and the baby are growing normally.  Your blood pressure will be taken.  Your abdomen will be measured to track your baby's growth.  The fetal heartbeat will be listened to.  Any test results from the previous visit will be discussed.  You may have a cervical check near your due date to see if your cervix has softened or thinned (effaced).  You will be tested for Group B streptococcus. This happens between 35 and 37 weeks. Your health care provider may ask you:  What your birth plan is.  How you are feeling.  If you are feeling the baby move.  If you have had any abnormal   symptoms, such as leaking fluid, bleeding, severe headaches, or abdominal cramping.  If you are using any tobacco products, including cigarettes, chewing tobacco, and electronic cigarettes.  If you have any questions. Other tests or screenings that may be performed during your third trimester include:  Blood tests that check for low iron levels (anemia).  Fetal testing to check the health, activity level, and growth of the fetus. Testing is done if you have certain medical conditions or if there are problems during the pregnancy.  Nonstress test  (NST). This test checks the health of your baby to make sure there are no signs of problems, such as the baby not getting enough oxygen. During this test, a belt is placed around your belly. The baby is made to move, and its heart rate is monitored during movement. What is false labor? False labor is a condition in which you feel small, irregular tightenings of the muscles in the womb (contractions) that usually go away with rest, changing position, or drinking water. These are called Braxton Hicks contractions. Contractions may last for hours, days, or even weeks before true labor sets in. If contractions come at regular intervals, become more frequent, increase in intensity, or become painful, you should see your health care provider. What are the signs of labor?  Abdominal cramps.  Regular contractions that start at 10 minutes apart and become stronger and more frequent with time.  Contractions that start on the top of the uterus and spread down to the lower abdomen and back.  Increased pelvic pressure and dull back pain.  A watery or bloody mucus discharge that comes from the vagina.  Leaking of amniotic fluid. This is also known as your "water breaking." It could be a slow trickle or a gush. Let your health care provider know if it has a color or strange odor. If you have any of these signs, call your health care provider right away, even if it is before your due date. Follow these instructions at home: Medicines  Follow your health care provider's instructions regarding medicine use. Specific medicines may be either safe or unsafe to take during pregnancy.  Take a prenatal vitamin that contains at least 600 micrograms (mcg) of folic acid.  If you develop constipation, try taking a stool softener if your health care provider approves. Eating and drinking   Eat a balanced diet that includes fresh fruits and vegetables, whole grains, good sources of protein such as meat, eggs, or tofu,  and low-fat dairy. Your health care provider will help you determine the amount of weight gain that is right for you.  Avoid raw meat and uncooked cheese. These carry germs that can cause birth defects in the baby.  If you have low calcium intake from food, talk to your health care provider about whether you should take a daily calcium supplement.  Eat four or five small meals rather than three large meals a day.  Limit foods that are high in fat and processed sugars, such as fried and sweet foods.  To prevent constipation: ? Drink enough fluid to keep your urine clear or pale yellow. ? Eat foods that are high in fiber, such as fresh fruits and vegetables, whole grains, and beans. Activity  Exercise only as directed by your health care provider. Most women can continue their usual exercise routine during pregnancy. Try to exercise for 30 minutes at least 5 days a week. Stop exercising if you experience uterine contractions.  Avoid heavy lifting.  Do   not exercise in extreme heat or humidity, or at high altitudes.  Wear low-heel, comfortable shoes.  Practice good posture.  You may continue to have sex unless your health care provider tells you otherwise. Relieving pain and discomfort  Take frequent breaks and rest with your legs elevated if you have leg cramps or low back pain.  Take warm sitz baths to soothe any pain or discomfort caused by hemorrhoids. Use hemorrhoid cream if your health care provider approves.  Wear a good support bra to prevent discomfort from breast tenderness.  If you develop varicose veins: ? Wear support pantyhose or compression stockings as told by your healthcare provider. ? Elevate your feet for 15 minutes, 3-4 times a day. Prenatal care  Write down your questions. Take them to your prenatal visits.  Keep all your prenatal visits as told by your health care provider. This is important. Safety  Wear your seat belt at all times when driving.  Make  a list of emergency phone numbers, including numbers for family, friends, the hospital, and police and fire departments. General instructions  Avoid cat litter boxes and soil used by cats. These carry germs that can cause birth defects in the baby. If you have a cat, ask someone to clean the litter box for you.  Do not travel far distances unless it is absolutely necessary and only with the approval of your health care provider.  Do not use hot tubs, steam rooms, or saunas.  Do not drink alcohol.  Do not use any products that contain nicotine or tobacco, such as cigarettes and e-cigarettes. If you need help quitting, ask your health care provider.  Do not use any medicinal herbs or unprescribed drugs. These chemicals affect the formation and growth of the baby.  Do not douche or use tampons or scented sanitary pads.  Do not cross your legs for long periods of time.  To prepare for the arrival of your baby: ? Take prenatal classes to understand, practice, and ask questions about labor and delivery. ? Make a trial run to the hospital. ? Visit the hospital and tour the maternity area. ? Arrange for maternity or paternity leave through employers. ? Arrange for family and friends to take care of pets while you are in the hospital. ? Purchase a rear-facing car seat and make sure you know how to install it in your car. ? Pack your hospital bag. ? Prepare the baby's nursery. Make sure to remove all pillows and stuffed animals from the baby's crib to prevent suffocation.  Visit your dentist if you have not gone during your pregnancy. Use a soft toothbrush to brush your teeth and be gentle when you floss. Contact a health care provider if:  You are unsure if you are in labor or if your water has broken.  You become dizzy.  You have mild pelvic cramps, pelvic pressure, or nagging pain in your abdominal area.  You have lower back pain.  You have persistent nausea, vomiting, or  diarrhea.  You have an unusual or bad smelling vaginal discharge.  You have pain when you urinate. Get help right away if:  Your water breaks before 37 weeks.  You have regular contractions less than 5 minutes apart before 37 weeks.  You have a fever.  You are leaking fluid from your vagina.  You have spotting or bleeding from your vagina.  You have severe abdominal pain or cramping.  You have rapid weight loss or weight gain.  You have   shortness of breath with chest pain.  You notice sudden or extreme swelling of your face, hands, ankles, feet, or legs.  Your baby makes fewer than 10 movements in 2 hours.  You have severe headaches that do not go away when you take medicine.  You have vision changes. Summary  The third trimester is from week 28 through week 40, months 7 through 9. The third trimester is a time when the unborn baby (fetus) is growing rapidly.  During the third trimester, your discomfort may increase as you and your baby continue to gain weight. You may have abdominal, leg, and back pain, sleeping problems, and an increased need to urinate.  During the third trimester your breasts will keep growing and they will continue to become tender. A yellow fluid (colostrum) may leak from your breasts. This is the first milk you are producing for your baby.  False labor is a condition in which you feel small, irregular tightenings of the muscles in the womb (contractions) that eventually go away. These are called Braxton Hicks contractions. Contractions may last for hours, days, or even weeks before true labor sets in.  Signs of labor can include: abdominal cramps; regular contractions that start at 10 minutes apart and become stronger and more frequent with time; watery or bloody mucus discharge that comes from the vagina; increased pelvic pressure and dull back pain; and leaking of amniotic fluid. This information is not intended to replace advice given to you by your  health care provider. Make sure you discuss any questions you have with your health care provider. Document Revised: 12/15/2018 Document Reviewed: 09/29/2016 [employer] Patient Education  2020 [employer]  

## 2020-02-07 LAB — 28 WEEK RH+PANEL
Basophils Absolute: 0 10*3/uL (ref 0.0–0.2)
Basos: 0 %
EOS (ABSOLUTE): 0.2 10*3/uL (ref 0.0–0.4)
Eos: 1 %
Gestational Diabetes Screen: 138 mg/dL (ref 65–139)
HIV Screen 4th Generation wRfx: NONREACTIVE
Hematocrit: 31.2 % — ABNORMAL LOW (ref 34.0–46.6)
Hemoglobin: 10.4 g/dL — ABNORMAL LOW (ref 11.1–15.9)
Immature Grans (Abs): 0.2 10*3/uL — ABNORMAL HIGH (ref 0.0–0.1)
Immature Granulocytes: 1 %
Lymphocytes Absolute: 2 10*3/uL (ref 0.7–3.1)
Lymphs: 12 %
MCH: 28.4 pg (ref 26.6–33.0)
MCHC: 33.3 g/dL (ref 31.5–35.7)
MCV: 85 fL (ref 79–97)
Monocytes Absolute: 0.4 10*3/uL (ref 0.1–0.9)
Monocytes: 3 %
Neutrophils Absolute: 13.1 10*3/uL — ABNORMAL HIGH (ref 1.4–7.0)
Neutrophils: 83 %
Platelets: 373 10*3/uL (ref 150–450)
RBC: 3.66 x10E6/uL — ABNORMAL LOW (ref 3.77–5.28)
RDW: 13.2 % (ref 11.7–15.4)
RPR Ser Ql: NONREACTIVE
WBC: 15.9 10*3/uL — ABNORMAL HIGH (ref 3.4–10.8)

## 2020-02-20 ENCOUNTER — Ambulatory Visit (INDEPENDENT_AMBULATORY_CARE_PROVIDER_SITE_OTHER): Payer: Medicaid Other | Admitting: Obstetrics and Gynecology

## 2020-02-20 ENCOUNTER — Ambulatory Visit (INDEPENDENT_AMBULATORY_CARE_PROVIDER_SITE_OTHER): Payer: Medicaid Other

## 2020-02-20 ENCOUNTER — Other Ambulatory Visit: Payer: Self-pay

## 2020-02-20 ENCOUNTER — Encounter: Payer: Self-pay | Admitting: Obstetrics and Gynecology

## 2020-02-20 VITALS — BP 130/70 | Ht 65.0 in | Wt 317.6 lb

## 2020-02-20 DIAGNOSIS — Z23 Encounter for immunization: Secondary | ICD-10-CM

## 2020-02-20 DIAGNOSIS — O30043 Twin pregnancy, dichorionic/diamniotic, third trimester: Secondary | ICD-10-CM | POA: Diagnosis not present

## 2020-02-20 DIAGNOSIS — O0992 Supervision of high risk pregnancy, unspecified, second trimester: Secondary | ICD-10-CM

## 2020-02-20 DIAGNOSIS — O99213 Obesity complicating pregnancy, third trimester: Secondary | ICD-10-CM

## 2020-02-20 DIAGNOSIS — O30049 Twin pregnancy, dichorionic/diamniotic, unspecified trimester: Secondary | ICD-10-CM

## 2020-02-20 DIAGNOSIS — Z3A30 30 weeks gestation of pregnancy: Secondary | ICD-10-CM | POA: Diagnosis not present

## 2020-02-20 DIAGNOSIS — O0993 Supervision of high risk pregnancy, unspecified, third trimester: Secondary | ICD-10-CM

## 2020-02-20 DIAGNOSIS — O0990 Supervision of high risk pregnancy, unspecified, unspecified trimester: Secondary | ICD-10-CM

## 2020-02-20 NOTE — Progress Notes (Signed)
Routine Prenatal Care Visit  Subjective  Crystal Hansen is a 29 y.o. G2P1001 at [redacted]w[redacted]d being seen today for ongoing prenatal care.  She is currently monitored for the following issues for this high-risk pregnancy and has Allergic rhinitis; Post concussion syndrome; Supervision of high risk pregnancy, antepartum; Obesity affecting pregnancy in first trimester; and Twin dichorionic diamniotic placenta on their problem list.  ----------------------------------------------------------------------------------- Patient reports sciatic nerve pain and difficulty sleeping.   Contractions: Irregular. Vag. Bleeding: None.  Movement: Present. Denies leaking of fluid.  ----------------------------------------------------------------------------------- The following portions of the patient's history were reviewed and updated as appropriate: allergies, current medications, past family history, past medical history, past social history, past surgical history and problem list. Problem list updated.   Objective  Blood pressure 130/70, height 5\' 5"  (1.651 m), weight (!) 317 lb 9.6 oz (144.1 kg), last menstrual period 07/23/2019. Pregravid weight 260 lb (117.9 kg) Total Weight Gain 57 lb 9.6 oz (26.1 kg) Urinalysis:      Fetal Status:     Movement: Present     General:  Alert, oriented and cooperative. Patient is in no acute distress.  Skin: Skin is warm and dry. No rash noted.   Cardiovascular: Normal heart rate noted  Respiratory: Normal respiratory effort, no problems with respiration noted  Abdomen: Soft, gravid, appropriate for gestational age. Pain/Pressure: Absent     Pelvic:  Cervical exam deferred        Extremities: Normal range of motion.     Mental Status: Normal mood and affect. Normal behavior. Normal judgment and thought content.     Assessment   29 y.o. G2P1001 at [redacted]w[redacted]d by  04/28/2020, by Last Menstrual Period presenting for routine prenatal visit  Plan   pregnancy2   Problems (from 07/23/19 to present)    Problem Noted Resolved   Twin dichorionic diamniotic placenta 10/25/2019 by Gae Dry, MD No   Overview Addendum 12/11/2019 12:19 PM by Homero Fellers, MD    Risks discussed Monitor for PTL        Previous Version   Supervision of high risk pregnancy, antepartum 10/24/2019 by Gae Dry, MD No   Overview Addendum 02/20/2020 10:14 AM by Homero Fellers, MD    [facility] Prenatal [facility]  Dating 13 weeks, Twins Blood type: A/Positive/-- (02/17 1052)   Genetic Screen [place] Antibody:Negative (02/17 1052)  Anatomic [place] Normal, need PCI to complete[ ]    Rubella: <0.90 (02/17 1052) Varicella Immune  GTT Early:  96              Third trimester: 138 RPR: Non Reactive (02/17 1052)   Rhogam  not needed HBsAg: Negative (02/17 1052)   TDaP vaccine     02/20/2020      Flu Shot: HIV: Non Reactive (02/17 1052)   Baby Food   Bottle feeding                             GBS:   Contraception  declines Pap:10/24/19  CBB  No   CS/VBAC n/a   Support Person            Previous Version   Obesity affecting pregnancy in first trimester 10/24/2019 by Gae Dry, MD No   Overview Addendum 02/20/2020 10:16 AM by Homero Fellers, MD    BMI >=40 [x ] early 1h gtt -  [x ] u/s for dating [ x]  [x ] nutritional  goals [x ] folic acid 1mg  [ ]  bASA (>12 weeks) [ ]  consider nutrition consult [ ]  consider maternal EKG 1st trimester [ ]  Growth u/s 28 [ ] , 32 [ ] , 36 weeks [ ]  [ ]  NST/AFI weekly 34+        Previous Version       Repeat growth in 4 weeks Discussed covid vaccination- patient desires to have postpartum. Discussed benefits during pregnancy Discussed anemia. Patient not taking oral iron. Recommended 120 mg a day OTC supplement.  Discussed borderline 1hr GTT. Patient declines 3 hr GTT.   Anesthesia referral placed. Discussed bottle feeding plans.  Declines contraception postpartum.   Gestational age  appropriate obstetric precautions including but not limited to vaginal bleeding, contractions, leaking of fluid and fetal movement were reviewed in detail with the patient.    Return in about 2 weeks (around 03/05/2020) for ROB in person with MD.  MD [facility], [facility] 02/20/2020, 10:15 AM

## 2020-02-20 NOTE — Patient Instructions (Addendum)
Third Trimester of Pregnancy The third trimester is from week 28 through week 40 (months 7 through 9). The third trimester is a time when the unborn baby (fetus) is growing rapidly. At the end of the ninth month, the fetus is about 20 inches in length and weighs 6-10 pounds. Body changes during your third trimester Your body will continue to go through many changes during pregnancy. The changes vary from woman to woman. During the third trimester:  Your weight will continue to increase. You can expect to gain 25-35 pounds (11-16 kg) by the end of the pregnancy.  You may begin to get stretch marks on your hips, abdomen, and breasts.  You may urinate more often because the fetus is moving lower into your pelvis and pressing on your bladder.  You may develop or continue to have heartburn. This is caused by increased hormones that slow down muscles in the digestive tract.  You may develop or continue to have constipation because increased hormones slow digestion and cause the muscles that push waste through your intestines to relax.  You may develop hemorrhoids. These are swollen veins (varicose veins) in the rectum that can itch or be painful.  You may develop swollen, bulging veins (varicose veins) in your legs.  You may have increased body aches in the pelvis, back, or thighs. This is due to weight gain and increased hormones that are relaxing your joints.  You may have changes in your hair. These can include thickening of your hair, rapid growth, and changes in texture. Some women also have hair loss during or after pregnancy, or hair that feels dry or thin. Your hair will most likely return to normal after your baby is born.  Your breasts will continue to grow and they will continue to become tender. A yellow fluid (colostrum) may leak from your breasts. This is the first milk you are producing for your baby.  Your belly button may stick out.  You may notice more swelling in your hands,  face, or ankles.  You may have increased tingling or numbness in your hands, arms, and legs. The skin on your belly may also feel numb.  You may feel short of breath because of your expanding uterus.  You may have more problems sleeping. This can be caused by the size of your belly, increased need to urinate, and an increase in your body's metabolism.  You may notice the fetus "dropping," or moving lower in your abdomen (lightening).  You may have increased vaginal discharge.  You may notice your joints feel loose and you may have pain around your pelvic bone. What to expect at prenatal visits You will have prenatal exams every 2 weeks until week 36. Then you will have weekly prenatal exams. During a routine prenatal visit:  You will be weighed to make sure you and the baby are growing normally.  Your blood pressure will be taken.  Your abdomen will be measured to track your baby's growth.  The fetal heartbeat will be listened to.  Any test results from the previous visit will be discussed.  You may have a cervical check near your due date to see if your cervix has softened or thinned (effaced).  You will be tested for Group B streptococcus. This happens between 35 and 37 weeks. Your health care provider may ask you:  What your birth plan is.  How you are feeling.  If you are feeling the baby move.  If you have had any abnormal   symptoms, such as leaking fluid, bleeding, severe headaches, or abdominal cramping.  If you are using any tobacco products, including cigarettes, chewing tobacco, and electronic cigarettes.  If you have any questions. Other tests or screenings that may be performed during your third trimester include:  Blood tests that check for low iron levels (anemia).  Fetal testing to check the health, activity level, and growth of the fetus. Testing is done if you have certain medical conditions or if there are problems during the pregnancy.  Nonstress test  (NST). This test checks the health of your baby to make sure there are no signs of problems, such as the baby not getting enough oxygen. During this test, a belt is placed around your belly. The baby is made to move, and its heart rate is monitored during movement. What is false labor? False labor is a condition in which you feel small, irregular tightenings of the muscles in the womb (contractions) that usually go away with rest, changing position, or drinking water. These are called Braxton Hicks contractions. Contractions may last for hours, days, or even weeks before true labor sets in. If contractions come at regular intervals, become more frequent, increase in intensity, or become painful, you should see your health care provider. What are the signs of labor?  Abdominal cramps.  Regular contractions that start at 10 minutes apart and become stronger and more frequent with time.  Contractions that start on the top of the uterus and spread down to the lower abdomen and back.  Increased pelvic pressure and dull back pain.  A watery or bloody mucus discharge that comes from the vagina.  Leaking of amniotic fluid. This is also known as your "water breaking." It could be a slow trickle or a gush. Let your health care provider know if it has a color or strange odor. If you have any of these signs, call your health care provider right away, even if it is before your due date. Follow these instructions at home: Medicines  Follow your health care provider's instructions regarding medicine use. Specific medicines may be either safe or unsafe to take during pregnancy.  Take a prenatal vitamin that contains at least 600 micrograms (mcg) of folic acid.  If you develop constipation, try taking a stool softener if your health care provider approves. Eating and drinking   Eat a balanced diet that includes fresh fruits and vegetables, whole grains, good sources of protein such as meat, eggs, or tofu,  and low-fat dairy. Your health care provider will help you determine the amount of weight gain that is right for you.  Avoid raw meat and uncooked cheese. These carry germs that can cause birth defects in the baby.  If you have low calcium intake from food, talk to your health care provider about whether you should take a daily calcium supplement.  Eat four or five small meals rather than three large meals a day.  Limit foods that are high in fat and processed sugars, such as fried and sweet foods.  To prevent constipation: ? Drink enough fluid to keep your urine clear or pale yellow. ? Eat foods that are high in fiber, such as fresh fruits and vegetables, whole grains, and beans. Activity  Exercise only as directed by your health care provider. Most women can continue their usual exercise routine during pregnancy. Try to exercise for 30 minutes at least 5 days a week. Stop exercising if you experience uterine contractions.  Avoid heavy lifting.  Do   not exercise in extreme heat or humidity, or at high altitudes.  Wear low-heel, comfortable shoes.  Practice good posture.  You may continue to have sex unless your health care provider tells you otherwise. Relieving pain and discomfort  Take frequent breaks and rest with your legs elevated if you have leg cramps or low back pain.  Take warm sitz baths to soothe any pain or discomfort caused by hemorrhoids. Use hemorrhoid cream if your health care provider approves.  Wear a good support bra to prevent discomfort from breast tenderness.  If you develop varicose veins: ? Wear support pantyhose or compression stockings as told by your healthcare provider. ? Elevate your feet for 15 minutes, 3-4 times a day. Prenatal care  Write down your questions. Take them to your prenatal visits.  Keep all your prenatal visits as told by your health care provider. This is important. Safety  Wear your seat belt at all times when driving.  Make  a list of emergency phone numbers, including numbers for family, friends, the hospital, and police and fire departments. General instructions  Avoid cat litter boxes and soil used by cats. These carry germs that can cause birth defects in the baby. If you have a cat, ask someone to clean the litter box for you.  Do not travel far distances unless it is absolutely necessary and only with the approval of your health care provider.  Do not use hot tubs, steam rooms, or saunas.  Do not drink alcohol.  Do not use any products that contain nicotine or tobacco, such as cigarettes and e-cigarettes. If you need help quitting, ask your health care provider.  Do not use any medicinal herbs or unprescribed drugs. These chemicals affect the formation and growth of the baby.  Do not douche or use tampons or scented sanitary pads.  Do not cross your legs for long periods of time.  To prepare for the arrival of your baby: ? Take prenatal classes to understand, practice, and ask questions about labor and delivery. ? Make a trial run to the hospital. ? Visit the hospital and tour the maternity area. ? Arrange for maternity or paternity leave through employers. ? Arrange for family and friends to take care of pets while you are in the hospital. ? Purchase a rear-facing car seat and make sure you know how to install it in your car. ? Pack your hospital bag. ? Prepare the baby's nursery. Make sure to remove all pillows and stuffed animals from the baby's crib to prevent suffocation.  Visit your dentist if you have not gone during your pregnancy. Use a soft toothbrush to brush your teeth and be gentle when you floss. Contact a health care provider if:  You are unsure if you are in labor or if your water has broken.  You become dizzy.  You have mild pelvic cramps, pelvic pressure, or nagging pain in your abdominal area.  You have lower back pain.  You have persistent nausea, vomiting, or  diarrhea.  You have an unusual or bad smelling vaginal discharge.  You have pain when you urinate. Get help right away if:  Your water breaks before 37 weeks.  You have regular contractions less than 5 minutes apart before 37 weeks.  You have a fever.  You are leaking fluid from your vagina.  You have spotting or bleeding from your vagina.  You have severe abdominal pain or cramping.  You have rapid weight loss or weight gain.  You have   shortness of breath with chest pain.  You notice sudden or extreme swelling of your face, hands, ankles, feet, or legs.  Your baby makes fewer than 10 movements in 2 hours.  You have severe headaches that do not go away when you take medicine.  You have vision changes. Summary  The third trimester is from week 28 through week 40, months 7 through 9. The third trimester is a time when the unborn baby (fetus) is growing rapidly.  During the third trimester, your discomfort may increase as you and your baby continue to gain weight. You may have abdominal, leg, and back pain, sleeping problems, and an increased need to urinate.  During the third trimester your breasts will keep growing and they will continue to become tender. A yellow fluid (colostrum) may leak from your breasts. This is the first milk you are producing for your baby.  False labor is a condition in which you feel small, irregular tightenings of the muscles in the womb (contractions) that eventually go away. These are called Braxton Hicks contractions. Contractions may last for hours, days, or even weeks before true labor sets in.  Signs of labor can include: abdominal cramps; regular contractions that start at 10 minutes apart and become stronger and more frequent with time; watery or bloody mucus discharge that comes from the vagina; increased pelvic pressure and dull back pain; and leaking of amniotic fluid. This information is not intended to replace advice given to you by your  health care provider. Make sure you discuss any questions you have with your health care provider. Document Revised: 12/15/2018 Document Reviewed: 09/29/2016 Elsevier Patient Education  2020 [employer]             Iron-Rich Diet  Iron is a mineral that helps your body to produce hemoglobin. Hemoglobin is a protein in red blood cells that carries oxygen to your body's tissues. Eating too little iron may cause you to feel weak and tired, and it can increase your risk of infection. Iron is naturally found in many foods, and many foods have iron added to them (iron-fortified foods). You may need to follow an iron-rich diet if you do not have enough iron in your body due to certain medical conditions. The amount of iron that you need each day depends on your age, your sex, and any medical conditions you have. Follow instructions from your health care provider or a diet and nutrition specialist (dietitian) about how much iron you should eat each day. What are tips for following this plan? Reading food labels  Check food labels to see how many milligrams (mg) of iron are in each serving. Cooking  Cook foods in pots and pans that are made from iron.  Take these steps to make it easier for your body to absorb iron from certain foods: ? Soak beans overnight before cooking. ? Soak whole grains overnight and drain them before using. ? Ferment flours before baking, such as by using yeast in bread dough. Meal planning  When you eat foods that contain iron, you should eat them with foods that are high in vitamin C. These include oranges, peppers, tomatoes, potatoes, and mango. Vitamin C helps your body to absorb iron. General information  Take iron supplements only as told by your health care provider. An overdose of iron can be life-threatening. If you were prescribed iron supplements, take them with orange juice or a vitamin C supplement.  When you eat iron-fortified foods or take an  iron supplement, you should also eat foods that naturally contain iron, such as meat, poultry, and fish. Eating naturally iron-rich foods helps your body to absorb the iron that is added to other foods or contained in a supplement.  Certain foods and drinks prevent your body from absorbing iron properly. Avoid eating these foods in the same meal as iron-rich foods or with iron supplements. These foods include: ? Coffee, black tea, and red wine. ? Milk, dairy products, and foods that are high in calcium. ? Beans and soybeans. ? Whole grains. What foods should I eat? Fruits Prunes. Raisins. Eat fruits high in vitamin C, such as oranges, grapefruits, and strawberries, alongside iron-rich foods. Vegetables Spinach (cooked). Green peas. Broccoli. Fermented vegetables. Eat vegetables high in vitamin C, such as leafy greens, potatoes, bell peppers, and tomatoes, alongside iron-rich foods. Grains Iron-fortified breakfast cereal. Iron-fortified whole-wheat bread. Enriched rice. Sprouted grains. Meats and other proteins Beef liver. Oysters. Beef. Shrimp. [place]. Chicken. [place]. Sardines. Chickpeas. Nuts. Tofu. Pumpkin seeds. Beverages Tomato juice. Fresh orange juice. Prune juice. Hibiscus tea. Fortified instant breakfast shakes. Sweets and desserts Blackstrap molasses. Seasonings and condiments Tahini. Fermented soy sauce. Other foods Wheat germ. The items listed above may not be a complete list of recommended foods and beverages. Contact a dietitian for more information. What foods should I avoid? Grains Whole grains. Bran cereal. Bran flour. Oats. Meats and other proteins Soybeans. Products made from soy protein. Black beans. Lentils. Mung beans. Split peas. Dairy Milk. Cream. Cheese. Yogurt. Cottage cheese. Beverages Coffee. Black tea. Red wine. Sweets and desserts Cocoa. Chocolate. Ice cream. Other foods Basil. Oregano. Large amounts of parsley. The items listed above may not be a  complete list of foods and beverages to avoid. Contact a dietitian for more information. Summary  Iron is a mineral that helps your body to produce hemoglobin. Hemoglobin is a protein in red blood cells that carries oxygen to your body's tissues.  Iron is naturally found in many foods, and many foods have iron added to them (iron-fortified foods).  When you eat foods that contain iron, you should eat them with foods that are high in vitamin C. Vitamin C helps your body to absorb iron.  Certain foods and drinks prevent your body from absorbing iron properly, such as whole grains and dairy products. You should avoid eating these foods in the same meal as iron-rich foods or with iron supplements. This information is not intended to replace advice given to you by your health care provider. Make sure you discuss any questions you have with your health care provider. Document Revised: 08/06/2017 Document Reviewed: 07/20/2017 Elsevier Patient Education  2020 [employer].

## 2020-03-07 ENCOUNTER — Ambulatory Visit (INDEPENDENT_AMBULATORY_CARE_PROVIDER_SITE_OTHER): Payer: Medicaid Other | Admitting: Obstetrics & Gynecology

## 2020-03-07 ENCOUNTER — Encounter: Payer: Self-pay | Admitting: Obstetrics & Gynecology

## 2020-03-07 ENCOUNTER — Other Ambulatory Visit: Payer: Self-pay

## 2020-03-07 VITALS — BP 120/80 | Wt 313.0 lb

## 2020-03-07 DIAGNOSIS — O30049 Twin pregnancy, dichorionic/diamniotic, unspecified trimester: Secondary | ICD-10-CM

## 2020-03-07 DIAGNOSIS — O99213 Obesity complicating pregnancy, third trimester: Secondary | ICD-10-CM

## 2020-03-07 DIAGNOSIS — O0993 Supervision of high risk pregnancy, unspecified, third trimester: Secondary | ICD-10-CM

## 2020-03-07 DIAGNOSIS — Z3A32 32 weeks gestation of pregnancy: Secondary | ICD-10-CM

## 2020-03-07 LAB — POCT URINALYSIS DIPSTICK OB: Glucose, UA: NEGATIVE

## 2020-03-07 NOTE — Addendum Note (Signed)
Addended by: Cornelius Moras D on: 03/07/2020 11:40 AM   Modules accepted: Orders

## 2020-03-07 NOTE — Progress Notes (Signed)
  Subjective  Fetal Movement? yes Contractions? BHs daily, several times a day Leaking Fluid? no Vaginal Bleeding? no  Objective  BP 120/80   Wt (!) 313 lb (142 kg)   LMP 07/23/2019 (Exact Date)   BMI 52.09 kg/m  General: NAD Pumonary: no increased work of breathing Abdomen: gravid, non-tender Extremities: no edema Psychiatric: mood appropriate, affect full  Assessment  29 y.o. G2P1001 at [redacted]w[redacted]d by  04/28/2020, by Last Menstrual Period presenting for routine prenatal visit  Plan   Problem List Items Addressed This Visit      Other   Twin dichorionic diamniotic placenta   Relevant Orders   US OB Limited    Other Visit Diagnoses    [redacted] weeks gestation of pregnancy    -  Primary   Supervision of high risk pregnancy in third trimester       Obesity affecting pregnancy in third trimester          pregnancy2  Problems (from 07/23/19 to present)    Problem Noted Resolved   Twin dichorionic diamniotic placenta 10/25/2019 by Nadara Mustard, MD No   Overview Addendum 12/11/2019 12:19 PM by Natale Milch, MD    Risks discussed Monitor for PTL        Previous Version   Supervision of high risk pregnancy, antepartum 10/24/2019 by Nadara Mustard, MD No   Overview Addendum 02/20/2020 10:14 AM by Natale Milch, MD    [facility]inic [facility] Prenatal [facility]  Dating 13 weeks, Twins Blood type: A/Positive/-- (02/17 1052)   Genetic Screen NIPS:diploid Antibody:Negative (02/17 1052)  Anatomic [place] Normal, need PCI to complete[ ]    Rubella: <0.90 (02/17 1052) Varicella Immune  GTT Early:  96              Third trimester: 138 RPR: Non Reactive (02/17 1052)   Rhogam  not needed HBsAg: Negative (02/17 1052)   TDaP vaccine     02/20/2020      Flu Shot: HIV: Non Reactive (02/17 1052)   Baby Food   Bottle feeding                             GBS:   Contraception  declines Pap:10/24/19  CBB  No   CS/VBAC n/a   Support Person            Previous Version   Obesity affecting  pregnancy in first trimester 10/24/2019 by Nadara Mustard, MD No   Overview Addendum 02/20/2020 10:16 AM by Natale Milch, MD    BMI >=40 [x ] early 1h gtt -  [x ] u/s for dating [ x]  [x ] nutritional goals [x ] folic acid 1mg  [ ]  bASA (>12 weeks) [ ]  consider nutrition consult [ ]  consider maternal EKG 1st trimester [ ]  Growth u/s 28 [ ] , 32 [ ] , 36 weeks [ ]  [ ]  NST/AFI weekly 34+        Previous Version     PNV, FMC, PTL precautions  AFI, NST sch for 2 weeks  HROB 1 week  , MD, Ob/Gyn, [facility] 03/07/2020  11:29 AM

## 2020-03-07 NOTE — Patient Instructions (Signed)
Braxton Hicks Contractions °Contractions of the uterus can occur throughout pregnancy, but they are not always a sign that you are in labor. You may have practice contractions called Braxton Hicks contractions. These false labor contractions are sometimes confused with true labor. °What are Braxton Hicks contractions? °Braxton Hicks contractions are tightening movements that occur in the muscles of the uterus before labor. Unlike true labor contractions, these contractions do not result in opening (dilation) and thinning of the cervix. Toward the end of pregnancy (32-34 weeks), Braxton Hicks contractions can happen more often and may become stronger. These contractions are sometimes difficult to tell apart from true labor because they can be very uncomfortable. You should not feel embarrassed if you go to the hospital with false labor. °Sometimes, the only way to tell if you are in true labor is for your health care provider to look for changes in the cervix. The health care provider will do a physical exam and may monitor your contractions. If you are not in true labor, the exam should show that your cervix is not dilating and your water has not broken. °If there are no other health problems associated with your pregnancy, it is completely safe for you to be sent home with false labor. You may continue to have Braxton Hicks contractions until you go into true labor. °How to tell the difference between true labor and false labor °True labor °· Contractions last 30-70 seconds. °· Contractions become very regular. °· Discomfort is usually felt in the top of the uterus, and it spreads to the lower abdomen and low back. °· Contractions do not go away with walking. °· Contractions usually become more intense and increase in frequency. °· The cervix dilates and gets thinner. °False labor °· Contractions are usually shorter and not as strong as true labor contractions. °· Contractions are usually irregular. °· Contractions  are often felt in the front of the lower abdomen and in the groin. °· Contractions may go away when you walk around or change positions while lying down. °· Contractions get weaker and are shorter-lasting as time goes on. °· The cervix usually does not dilate or become thin. °Follow these instructions at home: ° °· Take over-the-counter and prescription medicines only as told by your health care provider. °· Keep up with your usual exercises and follow other instructions from your health care provider. °· Eat and drink lightly if you think you are going into labor. °· If Braxton Hicks contractions are making you uncomfortable: °? Change your position from lying down or resting to walking, or change from walking to resting. °? Sit and rest in a tub of warm water. °? Drink enough fluid to keep your urine pale yellow. Dehydration may cause these contractions. °? Do slow and deep breathing several times an hour. °· Keep all follow-up prenatal visits as told by your health care provider. This is important. °Contact a health care provider if: °· You have a fever. °· You have continuous pain in your abdomen. °Get help right away if: °· Your contractions become stronger, more regular, and closer together. °· You have fluid leaking or gushing from your vagina. °· You pass blood-tinged mucus (bloody show). °· You have bleeding from your vagina. °· You have low back pain that you never had before. °· You feel your baby’s head pushing down and causing pelvic pressure. °· Your baby is not moving inside you as much as it used to. °Summary °· Contractions that occur before labor are   called Braxton Hicks contractions, false labor, or practice contractions. °· Braxton Hicks contractions are usually shorter, weaker, farther apart, and less regular than true labor contractions. True labor contractions usually become progressively stronger and regular, and they become more frequent. °· Manage discomfort from Braxton Hicks contractions  by changing position, resting in a warm bath, drinking plenty of water, or practicing deep breathing. °This information is not intended to replace advice given to you by your health care provider. Make sure you discuss any questions you have with your health care provider. °Document Revised: 08/06/2017 Document Reviewed: 01/07/2017 °Elsevier Patient Education © 2020 Elsevier Inc. ° °

## 2020-03-15 ENCOUNTER — Other Ambulatory Visit: Payer: Self-pay

## 2020-03-15 ENCOUNTER — Encounter: Payer: Self-pay | Admitting: Obstetrics & Gynecology

## 2020-03-15 ENCOUNTER — Ambulatory Visit (INDEPENDENT_AMBULATORY_CARE_PROVIDER_SITE_OTHER): Payer: Medicaid Other | Admitting: Obstetrics & Gynecology

## 2020-03-15 VITALS — BP 130/80 | Wt 318.0 lb

## 2020-03-15 DIAGNOSIS — O30049 Twin pregnancy, dichorionic/diamniotic, unspecified trimester: Secondary | ICD-10-CM

## 2020-03-15 DIAGNOSIS — O99213 Obesity complicating pregnancy, third trimester: Secondary | ICD-10-CM

## 2020-03-15 DIAGNOSIS — Z3A33 33 weeks gestation of pregnancy: Secondary | ICD-10-CM

## 2020-03-15 DIAGNOSIS — O0993 Supervision of high risk pregnancy, unspecified, third trimester: Secondary | ICD-10-CM

## 2020-03-15 NOTE — Progress Notes (Signed)
Prenatal Visit Note Date: 03/15/2020 Clinic: [facility]  Subjective:  Crystal Hansen is a 29 y.o. G2P1001 at [redacted]w[redacted]d being seen today for ongoing prenatal care.  She is currently monitored for the following issues for this high-risk pregnancy and has Allergic rhinitis; Post concussion syndrome; Supervision of high risk pregnancy, antepartum; Obesity affecting pregnancy in first trimester; and Twin dichorionic diamniotic placenta on their problem list.  Patient reports occas ctxs but mild.  No VB or ROM.  Plans to travel to beach [place] 19[place], advised pros and cons of travel this late in preg w twins..   Contractions: Irregular. Vag. Bleeding: None.  Movement: Present. Denies leaking of fluid.   The following portions of the patient's history were reviewed and updated as appropriate: allergies, current medications, past family history, past medical history, past social history, past surgical history and problem list. Problem list updated.  Objective:   Vitals:   03/15/20 1020  BP: 130/80  Weight: (!) 318 lb (144.2 kg)    Fetal Status:     Movement: Present     General:  Alert, oriented and cooperative. Patient is in no acute distress.  Skin: Skin is warm and dry. No rash noted.   Cardiovascular: Normal heart rate noted  Respiratory: Normal respiratory effort, no problems with respiration noted  Abdomen: Soft, gravid, appropriate for gestational age. Pain/Pressure: Present     Pelvic:  Cervical exam deferred        Extremities: Normal range of motion.     Mental Status: Normal mood and affect. Normal behavior. Normal judgment and thought content.   Urinalysis:      Assessment and Plan:  Pregnancy: G2P1001 at [redacted]w[redacted]d  1. [redacted] weeks gestation of pregnancy PNV, [facility]  2. Supervision of high risk pregnancy in third trimester NST weekly, sch at [facility] for 7/15 and 7/26 so far [place] 7/15 for AFI, position  3. Obesity affecting pregnancy in third trimester  4. Twin dichorionic diamniotic  placenta  Preterm labor symptoms and general obstetric precautions including but not limited to vaginal bleeding, contractions, leaking of fluid and fetal movement were reviewed in detail with the patient. Please refer to After Visit Summary for other counseling recommendations.    Overview Addendum 02/20/2020 10:14 AM by Natale Milch, MD    Clinic [facility] Prenatal Labs  Dating 13 weeks, Twins Blood type: A/Positive/-- (02/17 1052)   Genetic Screen NIPS:diploid Antibody:Negative (02/17 1052)  Anatomic [place] Normal, need PCI to complete[ ]    Rubella: <0.90 (02/17 1052) Varicella Immune  GTT Early:  96              Third trimester: 138 RPR: Non Reactive (02/17 1052)   Rhogam  not needed HBsAg: Negative (02/17 1052)   TDaP vaccine     02/20/2020      Flu Shot:no HIV: Non Reactive (02/17 1052)   Baby Food   Bottle feeding                             GBS: p  Contraception  declines Pap:10/24/19  CBB  No   CS/VBAC n/a   Support Person             Appt 7/15 as scheduled Return in 10 days (on 03/25/2020) for am HROB appt this day.  Annamarie Major, MD, [facility], [facility] 03/15/2020  10:41 AM

## 2020-03-15 NOTE — Patient Instructions (Signed)
Nonstress Test  Scheduled for 7/15 at 10:00 on L&D Also for 7/22 (unsure time yet)  A nonstress test is a procedure that is done during pregnancy in order to check the baby's heartbeat. The procedure can help show if the baby (fetus) is healthy. It is commonly done when:  The baby is past his or her due date.  The pregnancy is high risk.  The baby is moving less than normal.  The mother has lost a pregnancy in the past.  The health care provider suspects a problem with the baby's growth.  There is too much or too little amniotic fluid. The procedure is often done in the third trimester of pregnancy to find out if an early delivery is needed and whether such a delivery is safe. During a nonstress test, the baby's heartbeat is monitored when the baby is resting and when the baby is moving. If the baby is healthy, the heart rate will increase when he or she moves or kicks and will return to normal when he or she rests. Tell a health care provider about:  Any allergies you have.  Any medical conditions you have.  All medicines you are taking, including vitamins, herbs, eye drops, creams, and over-the-counter medicines. What are the risks? There are no risks to you or your baby from a nonstress test. This procedure should not be painful or uncomfortable. What happens before the procedure?  Eat a meal right before the test or as directed by your health care provider. Food may help encourage the baby to move.  Use the restroom right before the test. What happens during the procedure?  Two monitors will be placed on your abdomen. One will record the baby's heart rate and the other will record the contractions of your uterus.  You may be asked to lie down on your side or to sit upright.  You may be given a button to press when you feel your baby move.  Your health care provider will listen to your baby's heartbeat and recorded it. He or she may also watch your baby's heartbeat on a  screen.  If the baby seems to be sleeping, you may be asked to drink some juice or soda, eat a snack, or change positions. The procedure may vary among health care providers and hospitals. What happens after the procedure?  Your health care provider will discuss the test results with you and make recommendations for the future. Depending on the results, your health care provider may order additional tests or another course of action.  If your health care provider gave you any diet or activity instructions, make sure to follow them.  Keep all follow-up visits as told by your health care provider. This is important. Summary  A nonstress test is a procedure that is done during pregnancy in order to check the baby's heartbeat. The procedure can help show if the baby is healthy.  The procedure is often done in the third trimester of pregnancy to find out if an early delivery is needed and whether such a delivery is safe.  During a nonstress test, the baby's heartbeat is monitored when the baby is resting and when the baby is moving. If the baby is healthy, the heart rate will increase when he or she moves or kicks and will return to normal when he or she rests.  Your health care provider will discuss the test results with you and make recommendations for the future. This information is not intended to  replace advice given to you by your health care provider. Make sure you discuss any questions you have with your health care provider. Document Revised: 12/03/2016 Document Reviewed: 12/03/2016 Elsevier Patient Education  2020 [employer].  Thank you for choosing [facility]. As part of our ongoing efforts to improve patient experience, we would appreciate your feedback. Please fill out the short survey that you will receive by mail or MyChart. Your opinion is important to [place]! -Dr Tiburcio Pea

## 2020-03-18 ENCOUNTER — Other Ambulatory Visit: Payer: Self-pay

## 2020-03-18 ENCOUNTER — Encounter
Admission: RE | Admit: 2020-03-18 | Discharge: 2020-03-18 | Disposition: A | Discharge status: Home / Self Care | Payer: Medicaid Other | Source: Ambulatory Visit | Attending: Anesthesiology | Admitting: Anesthesiology

## 2020-03-18 NOTE — Consult Note (Signed)
[facility] Anesthesia Consultation  Crystal Hansen UMP:[id number] DOB: 1991/08/10 DOA: 03/18/2020 PCP: Patient, No Pcp Per   Requesting physician: Dr. Tiburcio Pea Date of consultation: 03/18/20 Reason for consultation: Obesity during pregnancy  CHIEF COMPLAINT:  Obesity during pregnancy  HISTORY OF PRESENT ILLNESS: Crystal Hansen  is a 29 y.o. female with a known history of obesity during pregnancy. Prior vaginal delivery with epidural analgesia. Current di-di twin pregnancy, planning for vaginal delivery with epidural. Denies hx of cardiac disease. Denies hx of asthma. Denies personal or family hx of bleeding disorders.   PAST MEDICAL HISTORY:   Past Medical History:  Diagnosis Date  . Allergy   . Anxiety   . Arthritis   . Headache     PAST SURGICAL HISTORY:  Past Surgical History:  Procedure Laterality Date  . APPENDECTOMY      SOCIAL HISTORY:  Social History   Tobacco Use  . Smoking status: Former Smoker    Packs/day: 0.50    Types: Cigarettes  . Smokeless tobacco: Never Used  Substance Use Topics  . Alcohol use: Not Currently    Comment: on occasion    FAMILY HISTORY:  Family History  Problem Relation Age of Onset  . Hernia Father   . Hypertension Father   . Lung cancer Maternal Grandmother   . Heart disease Maternal Grandfather   . Diabetes Maternal Grandfather   . Hypertension Maternal Grandfather   . Anemia Paternal Grandmother   . Prostate cancer Paternal Grandfather     DRUG ALLERGIES:  Allergies  Allergen Reactions  . Peanut Oil Anaphylaxis  . Shellfish Allergy Anaphylaxis  . Amoxapine And Related   . Apple Other (See Comments)  . Amoxicillin Rash    REVIEW OF SYSTEMS:   RESPIRATORY: No cough, shortness of breath, wheezing.  CARDIOVASCULAR: No chest pain, orthopnea, edema.  HEMATOLOGY: No anemia, easy bruising or bleeding SKIN: No rash or lesion. NEUROLOGIC: No tingling, numbness, weakness.   PSYCHIATRY: No anxiety or depression.   MEDICATIONS AT HOME:  Prior to Admission medications   Medication Sig Start Date End Date Taking? Authorizing Provider  Butalbital-APAP-Caffeine 50-300-40 MG CAPS Take by mouth. 02/15/19   [provider]  Prenatal Vit-Fe Fumarate-FA (PRENATAL VITAMINS) 28-0.8 MG TABS Take 1 each by mouth daily. 12/11/19   Natale Milch, MD      PHYSICAL EXAMINATION:   VITAL SIGNS: Last menstrual period 07/23/2019.  GENERAL:  29 y.o.-year-old patient no acute distress.  HEENT: Head atraumatic, normocephalic. Oropharynx and nasopharynx clear. MP 2, TM distance >3 cm, normal mouth opening, grade 2 upper lip bite. LUNGS: No use of accessory muscles of respiration.  EXTREMITIES: No pedal edema, cyanosis, or clubbing.  NEUROLOGIC: normal gait PSYCHIATRIC: The patient is alert and oriented x 3.  SKIN: No obvious rash, lesion, or ulcer.    IMPRESSION AND PLAN:   Crystal Hansen  is a 29 y.o. female presenting with obesity during pregnancy. BMI is currently 52 at [redacted] weeks gestation. Twin pregnancy, planning for vaginal delivery at this time.   Airway exam reassuring today. Spinal interspaces palpable.   We discussed analgesic options during labor including epidural analgesia. Discussed that in obesity there can be increased difficulty with epidural placement or even failure of successful epidural. We also discussed that even after successful epidural placement there is increased risk of catheter migration out of the epidural space that would require catheter replacement. Recommended early epidural placement given twin pregnancy, to ensure working epidural at time of delivery. Discussed  use of epidural vs spinal vs GA if cesarean delivery is required. Discussed increased risk of difficult intubation during pregnancy should an emergency cesarean delivery be required.   Plan for delivery at [facility].

## 2020-03-21 ENCOUNTER — Encounter: Payer: Self-pay | Admitting: Obstetrics & Gynecology

## 2020-03-21 ENCOUNTER — Other Ambulatory Visit: Payer: Self-pay

## 2020-03-21 ENCOUNTER — Other Ambulatory Visit: Payer: Self-pay | Admitting: Obstetrics & Gynecology

## 2020-03-21 ENCOUNTER — Ambulatory Visit (INDEPENDENT_AMBULATORY_CARE_PROVIDER_SITE_OTHER): Payer: Medicaid Other | Admitting: Obstetrics & Gynecology

## 2020-03-21 ENCOUNTER — Ambulatory Visit (INDEPENDENT_AMBULATORY_CARE_PROVIDER_SITE_OTHER): Payer: Medicaid Other

## 2020-03-21 VITALS — BP 122/82 | Wt 323.0 lb

## 2020-03-21 DIAGNOSIS — O30049 Twin pregnancy, dichorionic/diamniotic, unspecified trimester: Secondary | ICD-10-CM

## 2020-03-21 DIAGNOSIS — O0993 Supervision of high risk pregnancy, unspecified, third trimester: Secondary | ICD-10-CM

## 2020-03-21 DIAGNOSIS — Z3A34 34 weeks gestation of pregnancy: Secondary | ICD-10-CM

## 2020-03-21 DIAGNOSIS — O99213 Obesity complicating pregnancy, third trimester: Secondary | ICD-10-CM | POA: Diagnosis not present

## 2020-03-21 LAB — FETAL NONSTRESS TEST

## 2020-03-21 NOTE — Progress Notes (Signed)
Subjective  Fetal Movement? yes Contractions? no Leaking Fluid? no Vaginal Bleeding? no  Objective  BP 122/82   Wt (!) 323 lb (146.5 kg)   LMP 07/23/2019 (Exact Date)   BMI 53.75 kg/m  General: NAD Pumonary: no increased work of breathing Abdomen: gravid, non-tender Extremities: no edema Psychiatric: mood appropriate, affect full  A NST procedure was performed on each twin with FHR monitoring and a normal baseline established, appropriate time of 20-40 minutes of evaluation, and accels >2 seen w 15x15 characteristics.  Results show a REACTIVE NST.   Review of ULTRASOUND.    I have personally reviewed images and report of recent ultrasound done at [facility].    Plan of management to be discussed with patient.  Assessment  29 y.o. G2P1001 at [redacted]w[redacted]d by  04/28/2020, by Last Menstrual Period presenting for routine prenatal visit  Plan   Problem List Items Addressed This Visit      Other   Twin dichorionic diamniotic placenta    Other Visit Diagnoses    [redacted] weeks gestation of pregnancy    -  Primary   Supervision of high risk pregnancy in third trimester       Obesity affecting pregnancy in third trimester          pregnancy2  Problems (from 07/23/19 to present)    Problem Noted Resolved   Twin dichorionic diamniotic placenta 10/25/2019 by Nadara Mustard, MD No   Overview Addendum 12/11/2019 12:19 PM by Natale Milch, MD    Risks discussed Monitor for PTL        Previous Version   Supervision of high risk pregnancy, antepartum 10/24/2019 by Nadara Mustard, MD No   Overview Addendum 02/20/2020 10:14 AM by Natale Milch, MD    Clinic [facility] Prenatal Labs  Dating 13 weeks, Twins Blood type: A/Positive/-- (02/17 1052)   Genetic Screen NIPS:diploid Antibody:Negative (02/17 1052)  Anatomic [place] Normal, need PCI to complete[ ]    Rubella: <0.90 (02/17 1052) Varicella Immune  GTT Early:  96              Third trimester: 138 RPR: Non Reactive (02/17 1052)    Rhogam  not needed HBsAg: Negative (02/17 1052)   TDaP vaccine     02/20/2020      Flu Shot: HIV: Non Reactive (02/17 1052)   Baby Food   Bottle feeding                             GBS:   Contraception  declines Pap:10/24/19  CBB  No   CS/VBAC n/a   Support Person            Previous Version   Obesity affecting pregnancy in first trimester 10/24/2019 by Nadara Mustard, MD No   Overview Addendum 02/20/2020 10:16 AM by Natale Milch, MD    BMI >=40 [x ] early 1h gtt -  [x ] u/s for dating [ x]  [x ] nutritional goals [x ] folic acid 1mg  [x ] bASA (>12 weeks) [ ]  consider nutrition consult [ ]  consider maternal EKG 1st trimester [x ] Growth u/s 28 [ ] , 32 [ ] , 36 weeks [ ]  [x ] NST/AFI weekly 34+       NST weekly, AFI weekly PNV, [facility] Plans vag delivery of twins; Vtx/Vtx today on Anes consult reviewed  , MD, Ob/Gyn, [facility] 03/21/2020  9:44 AM

## 2020-03-21 NOTE — Patient Instructions (Signed)
Thank you for choosing [facility]. As part of our ongoing efforts to improve patient experience, we would appreciate your feedback. Please fill out the short survey that you will receive by mail or MyChart. Your opinion is important to us! -Dr Suha Schoenbeck  

## 2020-03-25 ENCOUNTER — Ambulatory Visit (INDEPENDENT_AMBULATORY_CARE_PROVIDER_SITE_OTHER): Payer: Medicaid Other | Admitting: Obstetrics and Gynecology

## 2020-03-25 ENCOUNTER — Other Ambulatory Visit: Payer: Self-pay

## 2020-03-25 ENCOUNTER — Encounter: Payer: Self-pay | Admitting: Obstetrics and Gynecology

## 2020-03-25 VITALS — BP 130/72 | Ht 65.0 in | Wt 323.2 lb

## 2020-03-25 DIAGNOSIS — O0993 Supervision of high risk pregnancy, unspecified, third trimester: Secondary | ICD-10-CM

## 2020-03-25 DIAGNOSIS — K2100 Gastro-esophageal reflux disease with esophagitis, without bleeding: Secondary | ICD-10-CM

## 2020-03-25 DIAGNOSIS — O0990 Supervision of high risk pregnancy, unspecified, unspecified trimester: Secondary | ICD-10-CM

## 2020-03-25 DIAGNOSIS — O30049 Twin pregnancy, dichorionic/diamniotic, unspecified trimester: Secondary | ICD-10-CM

## 2020-03-25 DIAGNOSIS — O99213 Obesity complicating pregnancy, third trimester: Secondary | ICD-10-CM

## 2020-03-25 DIAGNOSIS — Z3A35 35 weeks gestation of pregnancy: Secondary | ICD-10-CM

## 2020-03-25 LAB — POCT URINALYSIS DIPSTICK OB: Glucose, UA: NEGATIVE

## 2020-03-25 LAB — POCT URINALYSIS DIPSTICK
Bilirubin, UA: POSITIVE
Blood, UA: POSITIVE
Glucose, UA: NEGATIVE
Leukocytes, UA: NEGATIVE
Nitrite, UA: NEGATIVE
Protein, UA: POSITIVE — AB
Spec Grav, UA: >=1.03 — AB (ref 1.010–1.025)
Urobilinogen, UA: 0.2 E.U./dL
pH, UA: 5 (ref 5.0–8.0)

## 2020-03-25 MED ORDER — SUCRALFATE 1 G PO TABS: 1.0000 g | ORAL_TABLET | Freq: Three times a day (TID) | ORAL | 3 refills | Status: DC

## 2020-03-25 NOTE — Progress Notes (Signed)
Routine Prenatal Care Visit  Subjective  Crystal Hansen is a 29 y.o. G2P1001 at [redacted]w[redacted]d being seen today for ongoing prenatal care.  She is currently monitored for the following issues for this high-risk pregnancy and has Allergic rhinitis; Post concussion syndrome; Supervision of high risk pregnancy, antepartum; Obesity affecting pregnancy in first trimester; and Twin dichorionic diamniotic placenta on their problem list.  ----------------------------------------------------------------------------------- Patient reports reflux, vomiting, diarrhea this morning, dehydration. Symptoms now improving. Denies headaches, denies vision changes, denies RUQ pain.   Contractions: Irregular. Vag. Bleeding: None.  Movement: Present. Denies leaking of fluid.  ----------------------------------------------------------------------------------- The following portions of the patient's history were reviewed and updated as appropriate: allergies, current medications, past family history, past medical history, past social history, past surgical history and problem list. Problem list updated.   Objective  Blood pressure 130/72, height 5\' 5"  (1.651 m), weight (!) 323 lb 3.2 oz (146.6 kg), last menstrual period 07/23/2019. Pregravid weight 260 lb (117.9 kg) Total Weight Gain 63 lb 3.2 oz (28.7 kg) Urinalysis:      Fetal Status: Fetal Heart Rate (bpm): 134/124   Movement: Present  Presentation: Vertex  General:  Alert, oriented and cooperative. Patient is in no acute distress.  Skin: Skin is warm and dry. No rash noted.   Cardiovascular: Normal heart rate noted  Respiratory: Normal respiratory effort, no problems with respiration noted  Abdomen: Soft, gravid, appropriate for gestational age. Pain/Pressure: Present     Pelvic:  Cervical exam deferred        Extremities: Normal range of motion.  Edema: Trace  Mental Status: Normal mood and affect. Normal behavior. Normal judgment and thought content.      Assessment   29 y.o. G2P1001 at [redacted]w[redacted]d by  04/28/2020, by Last Menstrual Period presenting for routine prenatal visit  Plan   pregnancy2  Problems (from 07/23/19 to present)    Problem Noted Resolved   Twin dichorionic diamniotic placenta 10/25/2019 by 10/27/2019, MD No   Overview Addendum 12/11/2019 12:19 PM by 02/10/2020, MD    Risks discussed Monitor for PTL        Previous Version   Supervision of high risk pregnancy, antepartum 10/24/2019 by 10/26/2019, MD No   Overview Addendum 02/20/2020 10:14 AM by 02/22/2020, MD    Clinic [facility] Prenatal Labs  Dating 13 weeks, Twins Blood type: A/Positive/-- (02/17 1052)   Genetic Screen NIPS:diploid Antibody:Negative (02/17 1052)  Anatomic [birth year]1997 Normal, need PCI to complete[ ]    Rubella: <0.90 (02/17 1052) Varicella Immune  GTT Early:  96              Third trimester: 138 RPR: Non Reactive (02/17 1052)   Rhogam  not needed HBsAg: Negative (02/17 1052)   TDaP vaccine     02/20/2020      Flu Shot: HIV: Non Reactive (02/17 1052)   Baby Food   Bottle feeding                             GBS:   Contraception  declines Pap:10/24/19  CBB  No   CS/VBAC n/a   Support Person            Previous Version   Obesity affecting pregnancy in first trimester 10/24/2019 by 10/26/2019, MD No   Overview Addendum 02/20/2020 10:16 AM by 02/22/2020, MD    BMI >=40 [x ] early 1h gtt -  [  x ] u/s for dating [ x]  [x ] nutritional goals [x ] folic acid 1mg  [ ]  bASA (>12 weeks) [ ]  consider nutrition consult [ ]  consider maternal EKG 1st trimester [ ]  Growth u/s 28 [ ] , 32 [ ] , 36 weeks [ ]  [ ]  NST/AFI weekly 34+        Previous Version       Cautioned patient against travel. She is aware of where [place].  Discussed warnings or preeclampsia such as headache, vision changes, RUQ pain. Swelling.  Discussed elevated BPs and encouraged to obtain an BP cuff  and check BP twice a day. Seek care for elevated reading more than 140/90.   Patient has follow up planned for next week. Declines GBS/GC/CT testing today.   Gestational age appropriate obstetric precautions including but not limited to vaginal bleeding, contractions, leaking of fluid and fetal movement were reviewed in detail with the patient.    Return in about 1 week (around 04/01/2020) for ROB in person.  MD [facility], [facility] 03/25/2020, 12:39 PM

## 2020-03-25 NOTE — Progress Notes (Signed)
poc

## 2020-04-02 ENCOUNTER — Ambulatory Visit (INDEPENDENT_AMBULATORY_CARE_PROVIDER_SITE_OTHER): Payer: Medicaid Other | Admitting: Obstetrics & Gynecology

## 2020-04-02 ENCOUNTER — Encounter: Payer: Self-pay | Admitting: Obstetrics & Gynecology

## 2020-04-02 ENCOUNTER — Other Ambulatory Visit: Payer: Self-pay

## 2020-04-02 VITALS — BP 132/78 | Ht 65.0 in | Wt 329.2 lb

## 2020-04-02 DIAGNOSIS — O30043 Twin pregnancy, dichorionic/diamniotic, third trimester: Secondary | ICD-10-CM

## 2020-04-02 DIAGNOSIS — Z3685 Encounter for antenatal screening for Streptococcus B: Secondary | ICD-10-CM

## 2020-04-02 DIAGNOSIS — O99213 Obesity complicating pregnancy, third trimester: Secondary | ICD-10-CM

## 2020-04-02 DIAGNOSIS — Z3A36 36 weeks gestation of pregnancy: Secondary | ICD-10-CM

## 2020-04-02 DIAGNOSIS — O30049 Twin pregnancy, dichorionic/diamniotic, unspecified trimester: Secondary | ICD-10-CM

## 2020-04-02 DIAGNOSIS — O0993 Supervision of high risk pregnancy, unspecified, third trimester: Secondary | ICD-10-CM

## 2020-04-02 LAB — POCT URINALYSIS DIPSTICK OB
Glucose, UA: NEGATIVE
POC,PROTEIN,UA: NEGATIVE

## 2020-04-02 NOTE — Patient Instructions (Signed)
Nonstress Test Wed 1:00 pm (July 28, Aug 4)  A nonstress test is a procedure that is done during pregnancy in order to check the baby's heartbeat. The procedure can help show if the baby (fetus) is healthy. It is commonly done when:  The baby is past his or her due date.  The pregnancy is high risk.  The baby is moving less than normal.  The mother has lost a pregnancy in the past.  The health care provider suspects a problem with the baby's growth.  There is too much or too little amniotic fluid. The procedure is often done in the third trimester of pregnancy to find out if an early delivery is needed and whether such a delivery is safe. During a nonstress test, the baby's heartbeat is monitored when the baby is resting and when the baby is moving. If the baby is healthy, the heart rate will increase when he or she moves or kicks and will return to normal when he or she rests. Tell a health care provider about:  Any allergies you have.  Any medical conditions you have.  All medicines you are taking, including vitamins, herbs, eye drops, creams, and over-the-counter medicines. What are the risks? There are no risks to you or your baby from a nonstress test. This procedure should not be painful or uncomfortable. What happens before the procedure?  Eat a meal right before the test or as directed by your health care provider. Food may help encourage the baby to move.  Use the restroom right before the test. What happens during the procedure?  Two monitors will be placed on your abdomen. One will record the baby's heart rate and the other will record the contractions of your uterus.  You may be asked to lie down on your side or to sit upright.  You may be given a button to press when you feel your baby move.  Your health care provider will listen to your baby's heartbeat and recorded it. He or she may also watch your baby's heartbeat on a screen.  If the baby seems to be  sleeping, you may be asked to drink some juice or soda, eat a snack, or change positions. The procedure may vary among health care providers and hospitals. What happens after the procedure?  Your health care provider will discuss the test results with you and make recommendations for the future. Depending on the results, your health care provider may order additional tests or another course of action.  If your health care provider gave you any diet or activity instructions, make sure to follow them.  Keep all follow-up visits as told by your health care provider. This is important. Summary  A nonstress test is a procedure that is done during pregnancy in order to check the baby's heartbeat. The procedure can help show if the baby is healthy.  The procedure is often done in the third trimester of pregnancy to find out if an early delivery is needed and whether such a delivery is safe.  During a nonstress test, the baby's heartbeat is monitored when the baby is resting and when the baby is moving. If the baby is healthy, the heart rate will increase when he or she moves or kicks and will return to normal when he or she rests.  Your health care provider will discuss the test results with you and make recommendations for the future. This information is not intended to replace advice given to you by your  health care provider. Make sure you discuss any questions you have with your health care provider. Document Revised: 12/03/2016 Document Reviewed: 12/03/2016 Elsevier Patient Education  2020 [employer].

## 2020-04-02 NOTE — Progress Notes (Signed)
Subjective  Fetal Movement? yes Contractions? yes Leaking Fluid? no Vaginal Bleeding? no Some edema, denies ha, visual changes, CP, epigastric pain.  Objective  BP (!) 132/78   Ht 5\' 5"  (1.651 m)   Wt (!) 329 lb 3.2 oz (149.3 kg)   LMP 07/23/2019 (Exact Date)   BMI 54.78 kg/m  General: NAD Pumonary: no increased work of breathing Abdomen: gravid, non-tender Extremities: no edema Psychiatric: mood appropriate, affect full SVE 1/80/-3, Vtx twin A FHT 120 A and 130 B Assessment  29 y.o. G2P1001 at 100w2d by  04/28/2020, by Last Menstrual Period presenting for routine prenatal visit  Plan   Problem List Items Addressed This Visit      Other   Twin dichorionic diamniotic placenta   Relevant Orders   04/30/2020 OB Limited    Other Visit Diagnoses    [redacted] weeks gestation of pregnancy    -  Primary   Relevant Orders   POC Urinalysis Dipstick OB (Completed)   Supervision of high risk pregnancy in third trimester       Obesity affecting pregnancy in third trimester       Antenatal screening for streptococcus B       Relevant Orders   Culture, beta strep (group b only)      pregnancy2  Problems (from 07/23/19 to present)    Problem Noted Resolved   Twin dichorionic diamniotic placenta 10/25/2019 by 10/27/2019, MD No   Overview Addendum 12/11/2019 12:19 PM by 02/10/2020, MD    Risks discussed Monitor for PTL        Previous Version   Supervision of high risk pregnancy, antepartum 10/24/2019 by 10/26/2019, MD No   Overview Addendum 02/20/2020 10:14 AM by 02/22/2020, MD    Clinic [facility] Prenatal Labs  Dating 13 weeks, Twins Blood type: A/Positive/-- (02/17 1052)   Genetic Screen NIPS:diploid Antibody:Negative (02/17 1052)  Anatomic 04-22-1997 Normal, need PCI to complete[ ]    Rubella: <0.90 (02/17 1052) Varicella Immune  GTT Early:  96              Third trimester: 138 RPR: Non Reactive (02/17 1052)   Rhogam  not needed HBsAg: Negative (02/17 1052)    TDaP vaccine     02/20/2020      Flu Shot: HIV: Non Reactive (02/17 1052)   Baby Food   Bottle feeding                             GBS: DONE TODAY  Contraception  declines Pap:10/24/19  CBB  No   CS/VBAC n/a   Support Person            Previous Version   Obesity affecting pregnancy in first trimester 10/24/2019 by 10/26/2019, MD No   Overview Addendum 02/20/2020 10:16 AM by 02/22/2020, MD    BMI >=40 [x ] early 1h gtt -  [x ] u/s for dating [ x]  [x ] nutritional goals [x ] folic acid 1mg  [ ]  bASA (>12 weeks) [ ]  consider nutrition consult [ ]  consider maternal EKG 1st trimester [ ]  Growth u/s 28 [ ] , 32 [ ] , 36 weeks [ ]  [ ]  NST/AFI weekly 34+        Previous Version     NST scheduled for [facility] L&D tomorrow and next Wed  w AFI and Position sch soon  IOL discussed as possibility  after 38 weeks  Annamarie Major, MD, [facility], [facility] 04/02/2020  11:59 AM

## 2020-04-03 ENCOUNTER — Observation Stay
Admission: RE | Admit: 2020-04-03 | Discharge: 2020-04-03 | Disposition: A | Discharge status: Home / Self Care | Payer: Medicaid Other | Attending: Obstetrics and Gynecology | Admitting: Obstetrics and Gynecology

## 2020-04-03 ENCOUNTER — Encounter: Payer: Self-pay | Admitting: Obstetrics and Gynecology

## 2020-04-03 DIAGNOSIS — Z3689 Encounter for other specified antenatal screening: Secondary | ICD-10-CM | POA: Diagnosis present

## 2020-04-03 DIAGNOSIS — O30049 Twin pregnancy, dichorionic/diamniotic, unspecified trimester: Secondary | ICD-10-CM | POA: Diagnosis not present

## 2020-04-03 DIAGNOSIS — Z3A36 36 weeks gestation of pregnancy: Secondary | ICD-10-CM

## 2020-04-03 DIAGNOSIS — O99211 Obesity complicating pregnancy, first trimester: Secondary | ICD-10-CM

## 2020-04-03 DIAGNOSIS — O30003 Twin pregnancy, unspecified number of placenta and unspecified number of amniotic sacs, third trimester: Secondary | ICD-10-CM | POA: Insufficient documentation

## 2020-04-03 DIAGNOSIS — Z87891 Personal history of nicotine dependence: Secondary | ICD-10-CM | POA: Insufficient documentation

## 2020-04-03 DIAGNOSIS — O30043 Twin pregnancy, dichorionic/diamniotic, third trimester: Secondary | ICD-10-CM | POA: Diagnosis not present

## 2020-04-03 DIAGNOSIS — O0990 Supervision of high risk pregnancy, unspecified, unspecified trimester: Secondary | ICD-10-CM

## 2020-04-03 NOTE — Discharge Summary (Signed)
See Final progress Note \ Mirna Mires, CNM  04/03/2020 4:14 PM

## 2020-04-03 NOTE — Discharge Summary (Signed)
Final Progress Note  Patient ID: Crystal Hansen MRN: [id number] DOB/AGE: [birth year] 29 y.o.  Admit date: 04/03/2020 Admitting provider: Mirna Mires, CNM Discharge date: 04/03/2020   Admission Diagnoses: IUP twin gestation 36 weeks 3 days  Discharge Diagnoses:  Active Problems:   Indication for care/intervention related to labor/delivery, antepartum  Reactive NST  History of Present Illness: The patient is a 29 y.o. female G2P1001 at [redacted]w[redacted]d who presents for a scheduled NST.   Past Medical History:  Diagnosis Date  . Allergy   . Anxiety   . Arthritis   . Headache     Past Surgical History:  Procedure Laterality Date  . APPENDECTOMY      No current facility-administered medications on file prior to encounter.   Current Outpatient Medications on File Prior to Encounter  Medication Sig Dispense Refill  . Butalbital-APAP-Caffeine 50-300-40 MG CAPS Take by mouth.    . Prenatal Vit-Fe Fumarate-FA (PRENATAL VITAMINS) 28-0.8 MG TABS Take 1 each by mouth daily. 30 tablet 11  . sucralfate (CARAFATE) 1 g tablet Take 1 tablet (1 g total) by mouth 4 (four) times daily -  with meals and at bedtime. 120 tablet 3    Allergies  Allergen Reactions  . Peanut Oil Anaphylaxis  . Shellfish Allergy Anaphylaxis  . Amoxapine And Related   . Apple Other (See Comments)  . Amoxicillin Rash    Social History   Socioeconomic History  . Marital status: Single    Spouse name: Not on file  . Number of children: Not on file  . Years of education: Not on file  . Highest education level: Not on file  Occupational History  . Not on file  Tobacco Use  . Smoking status: Former Smoker    Packs/day: 0.50    Types: Cigarettes  . Smokeless tobacco: Never Used  Vaping Use  . Vaping Use: Never used  Substance and Sexual Activity  . Alcohol use: Not Currently    Comment: on occasion  . Drug use: Never  . Sexual activity: Yes  Other Topics Concern  . Not on file  Social History  Narrative  . Not on file   Social Determinants of Health   Financial Resource Strain:   . Difficulty of Paying Living Expenses:   Food Insecurity:   . Worried About Programme researcher, broadcasting/film/video in the Last Year:   . Barista in the Last Year:   Transportation Needs:   . Freight forwarder (Medical):   Marland Kitchen Lack of Transportation (Non-Medical):   Physical Activity:   . Days of Exercise per Week:   . Minutes of Exercise per Session:   Stress:   . Feeling of Stress :   Social Connections:   . Frequency of Communication with Friends and Family:   . Frequency of Social Gatherings with Friends and Family:   . Attends Religious Services:   . Active Member of Clubs or Organizations:   . Attends Banker Meetings:   Marland Kitchen Marital Status:   Intimate Partner Violence:   . Fear of Current or Ex-Partner:   . Emotionally Abused:   Marland Kitchen Physically Abused:   . Sexually Abused:     Family History  Problem Relation Age of Onset  . Hernia Father   . Hypertension Father   . Lung cancer Maternal Grandmother   . Heart disease Maternal Grandfather   . Diabetes Maternal Grandfather   . Hypertension Maternal Grandfather   . Anemia Paternal Grandmother   .  Prostate cancer Paternal Grandfather      ROS   Physical Exam: BP (!) 138/69   Pulse (!) 124   Temp 98.3 F (36.8 C) (Oral)   Resp 17   Ht 5\' 5"  (1.651 m)   Wt (!) 149.2 kg   LMP 07/23/2019 (Exact Date)   BMI 54.75 kg/m   OBGyn Exam  Consults: None  Significant Findings/ Diagnostic Studies: NA, NST only  Procedures: NST NST Baseline FHR: 130 beats/min for both twins Variability: moderate Accelerations: present Decelerations: absent Tocometry: No contractions seen. Abdomen is soft,  Interpretation:  INDICATIONS: multiple gestation RESULTS:  A NST procedure was performed with FHR monitoring and a normal baseline established, appropriate time of 20-40 minutes of evaluation, and accels >2 seen w 15x15 characteristics.   Results show a REACTIVE NST.    Hospital Course: The patient was admitted to Labor and Delivery Triage for observation. An NST was performed and deems to be reactive.  Discharge Condition: good  Disposition: Discharge disposition: 01-Home or Self Care       Diet: Regular diet  Discharge Activity: Activity as tolerated  Discharge Instructions    Fetal Kick Count:  Lie on our left side for one hour after a meal, and count the number of times your baby kicks.  If it is less than 5 times, get up, move around and drink some juice.  Repeat the test 30 minutes later.  If it is still less than 5 kicks in an hour, notify your doctor.   Complete by: As directed    LABOR:  When conractions begin, you should start to time them from the beginning of one contraction to the beginning  of the next.  When contractions are 5 - 10 minutes apart or less and have been regular for at least an hour, you should call your health care provider.   Complete by: As directed    Notify physician for bleeding from the vagina   Complete by: As directed    Notify physician for blurring of vision or spots before the eyes   Complete by: As directed    Notify physician for chills or fever   Complete by: As directed    Notify physician for fainting spells, "black outs" or loss of consciousness   Complete by: As directed    Notify physician for increase in vaginal discharge   Complete by: As directed    Notify physician for leaking of fluid   Complete by: As directed    Notify physician for pain or burning when urinating   Complete by: As directed    Notify physician for pelvic pressure (sudden increase)   Complete by: As directed    Notify physician for severe or continued nausea or vomiting   Complete by: As directed    Notify physician for sudden gushing of fluid from the vagina (with or without continued leaking)   Complete by: As directed    Notify physician for sudden, constant, or occasional abdominal  pain   Complete by: As directed    Notify physician if baby moving less than usual   Complete by: As directed      Allergies as of 04/03/2020      Reactions   Peanut Oil Anaphylaxis   Shellfish Allergy Anaphylaxis   Amoxapine And Related    Apple Other (See Comments)   Amoxicillin Rash      Medication List    TAKE these medications   Butalbital-APAP-Caffeine 50-300-40 MG Caps Take by  mouth.   Prenatal Vitamins 28-0.8 MG Tabs Take 1 each by mouth daily.   sucralfate 1 g tablet Commonly known as: Carafate Take 1 tablet (1 g total) by mouth 4 (four) times daily -  with meals and at bedtime.        Total time spent taking care of this patient: 20 minutes  Signed: Mirna Mires, CNM  04/03/2020, 4:15 PM

## 2020-04-03 NOTE — OB Triage Note (Signed)
NST for twins

## 2020-04-06 LAB — CULTURE, BETA STREP (GROUP B ONLY): Strep Gp B Culture: NEGATIVE

## 2020-04-07 ENCOUNTER — Encounter: Payer: Self-pay | Admitting: Obstetrics & Gynecology

## 2020-04-07 ENCOUNTER — Inpatient Hospital Stay
Admission: EM | Admit: 2020-04-07 | Discharge: 2020-04-07 | Disposition: A | Discharge status: Home / Self Care | Payer: Medicaid Other | Attending: Obstetrics & Gynecology | Admitting: Obstetrics & Gynecology

## 2020-04-07 ENCOUNTER — Other Ambulatory Visit: Payer: Self-pay

## 2020-04-07 DIAGNOSIS — O26899 Other specified pregnancy related conditions, unspecified trimester: Secondary | ICD-10-CM | POA: Diagnosis present

## 2020-04-07 DIAGNOSIS — O99211 Obesity complicating pregnancy, first trimester: Secondary | ICD-10-CM

## 2020-04-07 DIAGNOSIS — Z3A37 37 weeks gestation of pregnancy: Secondary | ICD-10-CM | POA: Diagnosis not present

## 2020-04-07 DIAGNOSIS — O99213 Obesity complicating pregnancy, third trimester: Secondary | ICD-10-CM | POA: Insufficient documentation

## 2020-04-07 DIAGNOSIS — O0990 Supervision of high risk pregnancy, unspecified, unspecified trimester: Secondary | ICD-10-CM

## 2020-04-07 DIAGNOSIS — R1030 Lower abdominal pain, unspecified: Secondary | ICD-10-CM | POA: Diagnosis present

## 2020-04-07 DIAGNOSIS — E669 Obesity, unspecified: Secondary | ICD-10-CM | POA: Diagnosis not present

## 2020-04-07 DIAGNOSIS — O30049 Twin pregnancy, dichorionic/diamniotic, unspecified trimester: Secondary | ICD-10-CM

## 2020-04-07 DIAGNOSIS — O30043 Twin pregnancy, dichorionic/diamniotic, third trimester: Secondary | ICD-10-CM

## 2020-04-07 DIAGNOSIS — O471 False labor at or after 37 completed weeks of gestation: Secondary | ICD-10-CM | POA: Diagnosis not present

## 2020-04-07 LAB — RUPTURE OF MEMBRANE (ROM)PLUS: Rom Plus: NEGATIVE

## 2020-04-07 MED ORDER — BETAMETHASONE SOD PHOS & ACET 6 (3-3) MG/ML IJ SUSP
INTRAMUSCULAR | Status: AC
  Filled 2020-04-07: qty 5

## 2020-04-07 NOTE — OB Triage Note (Signed)
Patient here for contractions for the past three hours about every 5 minuets apart. Has had some trickling of clear fluids since around lunch time, no large gushes.

## 2020-04-07 NOTE — Final Progress Note (Signed)
Physician Final Progress Note  Patient ID: Crystal Hansen MRN: [id number] DOB/AGE: 1991-03-11 29 y.o.  Admit date: 04/07/2020 Admitting provider: Nadara Mustard, MD Discharge date: 04/07/2020   Admission Diagnoses: Principal Problem:   Pregnancy related bilateral lower abdominal pain, antepartum  Discharge Diagnoses:  Principal Problem:   Pregnancy related bilateral lower abdominal pain, antepartum  Consults: None  Significant Findings/ Diagnostic Studies:  Obstetrics Admission History & Physical   CC: pain in lower abdomen  HPI:  29 y.o. G2P1001 @ [redacted]w[redacted]d (04/28/2020, by Last Menstrual Period). Admitted on 04/07/2020:   Patient Active Problem List   Diagnosis Date Noted  . Pregnancy related bilateral lower abdominal pain, antepartum 04/07/2020  . Indication for care/intervention related to labor/delivery, antepartum 04/03/2020  . Twin dichorionic diamniotic placenta 10/25/2019  . Supervision of high risk pregnancy, antepartum 10/24/2019  . Obesity affecting pregnancy in first trimester 10/24/2019  . Post concussion syndrome 03/23/2019  . Allergic rhinitis 02/23/2019     Presents for lower abdominal pains in pregnancy at 37 weeks w twins.   No VB or ROM.  No ha, CP, SOB.  Good FM.  Prenatal care at: at [facility]. Pregnancy complicated by multiple gestation.  ROS: A review of systems was performed and negative, except as stated in the above HPI. PMHx:  Past Medical History:  Diagnosis Date  . Allergy   . Anxiety   . Arthritis   . Headache    PSHx:  Past Surgical History:  Procedure Laterality Date  . APPENDECTOMY     Medications:  Medications Prior to Admission  Medication Sig Dispense Refill Last Dose  . Butalbital-APAP-Caffeine 50-300-40 MG CAPS Take by mouth.   Past Week at Unknown time  . Prenatal Vit-Fe Fumarate-FA (PRENATAL VITAMINS) 28-0.8 MG TABS Take 1 each by mouth daily. 30 tablet 11 04/06/2020 at Unknown time  . sucralfate (CARAFATE) 1 g tablet Take  1 tablet (1 g total) by mouth 4 (four) times daily -  with meals and at bedtime. (Patient not taking: Reported on 04/07/2020) 120 tablet 3 Completed Course at Unknown time   Allergies: is allergic to peanut oil, shellfish allergy, amoxapine and related, apple, and amoxicillin. OBHx:  OB History  Gravida Para Term Preterm AB Living  2 1 1     1   SAB TAB Ectopic Multiple Live Births          1    # Outcome Date GA Lbr Len/2nd Weight Sex Delivery Anes PTL Lv  2 Current           1 Term 12/11/12 [redacted]w[redacted]d  3714 g M Vag-Spont EPI  LIV   [redacted]w[redacted]d except as detailed in HPI.PRF:[id number]  No family history of birth defects. Soc Hx: Alcohol: none and Recreational drug use: none  Objective:   Vitals:   04/07/20 1620  BP: (!) 139/70  Pulse: (!) 116  Temp: 97.9 F (36.6 C)   Constitutional: Well nourished, well developed female in no acute distress.  HEENT: normal Skin: Warm and dry.  Cardiovascular:Regular rate and rhythm.   Extremity: trace to 1+ bilateral pedal edema Respiratory: Clear to auscultation bilateral. Normal respiratory effort Abdomen: gravid, ND, FHT present, mild tenderness on exam Back: no CVAT Neuro: DTRs 2+, Cranial nerves grossly intact Psych: Alert and Oriented x3. No memory deficits. Normal mood and affect.  MS: normal gait, normal bilateral lower extremity ROM/strength/stability.  Pelvic exam: is not limited by body habitus EGBUS: within normal limits Vagina: within normal limits and with normal mucosa Cervix: CERVIX:  2 cm dilated, 70 effaced, Ballottable Uterus: Spontaneous uterine activity  Adnexa: not evaluated   Assessment & Plan:   29 y.o. G2P1001 @ [redacted]w[redacted]d, Admitted on 04/07/2020: Early vs false labor   No change in cervix after multiple exams   Reassuring Fetal Wellbeing x2   Procedures: A NST procedure x 2 was performed with FHR monitoring and a normal baseline established, appropriate time of 20-40 minutes of evaluation, and accels >2 seen w 15x15  characteristics.  Results show a REACTIVE NST.   Discharge Condition: good  Disposition: Discharge disposition: 01-Home or Self Care       Diet: Regular diet  Discharge Activity: Activity as tolerated  Discharge Instructions    Call MD for:   Complete by: As directed    Worsening contractions or pain; leakage of fluid; bleeding.   Diet - low sodium heart healthy   Complete by: As directed    Increase activity slowly   Complete by: As directed      Allergies as of 04/07/2020      Reactions   Peanut Oil Anaphylaxis   Shellfish Allergy Anaphylaxis   Amoxapine And Related    Apple Other (See Comments)   Amoxicillin Rash      Medication List    TAKE these medications   Butalbital-APAP-Caffeine 50-300-40 MG Caps Take by mouth.   Prenatal Vitamins 28-0.8 MG Tabs Take 1 each by mouth daily.   sucralfate 1 g tablet Commonly known as: Carafate Take 1 tablet (1 g total) by mouth 4 (four) times daily -  with meals and at bedtime.       Follow-up Information    Conard Novak, MD. Go in 2 day(s).   Specialty: Obstetrics and Gynecology Contact information: [address] [place] [address] [phone]               Total time spent taking care of this patient: 20 minutes  Signed: Letitia Libra 04/07/2020, 6:32 PM

## 2020-04-07 NOTE — Discharge Summary (Signed)
SEE FPN 

## 2020-04-07 NOTE — Discharge Instructions (Signed)
Braxton Hicks Contractions °Contractions of the uterus can occur throughout pregnancy, but they are not always a sign that you are in labor. You may have practice contractions called Braxton Hicks contractions. These false labor contractions are sometimes confused with true labor. °What are Braxton Hicks contractions? °Braxton Hicks contractions are tightening movements that occur in the muscles of the uterus before labor. Unlike true labor contractions, these contractions do not result in opening (dilation) and thinning of the cervix. Toward the end of pregnancy (32-34 weeks), Braxton Hicks contractions can happen more often and may become stronger. These contractions are sometimes difficult to tell apart from true labor because they can be very uncomfortable. You should not feel embarrassed if you go to the hospital with false labor. °Sometimes, the only way to tell if you are in true labor is for your health care provider to look for changes in the cervix. The health care provider will do a physical exam and may monitor your contractions. If you are not in true labor, the exam should show that your cervix is not dilating and your water has not broken. °If there are no other health problems associated with your pregnancy, it is completely safe for you to be sent home with false labor. You may continue to have Braxton Hicks contractions until you go into true labor. °How to tell the difference between true labor and false labor °True labor °· Contractions last 30-70 seconds. °· Contractions become very regular. °· Discomfort is usually felt in the top of the uterus, and it spreads to the lower abdomen and low back. °· Contractions do not go away with walking. °· Contractions usually become more intense and increase in frequency. °· The cervix dilates and gets thinner. °False labor °· Contractions are usually shorter and not as strong as true labor contractions. °· Contractions are usually irregular. °· Contractions  are often felt in the front of the lower abdomen and in the groin. °· Contractions may go away when you walk around or change positions while lying down. °· Contractions get weaker and are shorter-lasting as time goes on. °· The cervix usually does not dilate or become thin. °Follow these instructions at home: ° °· Take over-the-counter and prescription medicines only as told by your health care provider. °· Keep up with your usual exercises and follow other instructions from your health care provider. °· Eat and drink lightly if you think you are going into labor. °· If Braxton Hicks contractions are making you uncomfortable: °? Change your position from lying down or resting to walking, or change from walking to resting. °? Sit and rest in a tub of warm water. °? Drink enough fluid to keep your urine pale yellow. Dehydration may cause these contractions. °? Do slow and deep breathing several times an hour. °· Keep all follow-up prenatal visits as told by your health care provider. This is important. °Contact a health care provider if: °· You have a fever. °· You have continuous pain in your abdomen. °Get help right away if: °· Your contractions become stronger, more regular, and closer together. °· You have fluid leaking or gushing from your vagina. °· You pass blood-tinged mucus (bloody show). °· You have bleeding from your vagina. °· You have low back pain that you never had before. °· You feel your baby’s head pushing down and causing pelvic pressure. °· Your baby is not moving inside you as much as it used to. °Summary °· Contractions that occur before labor are   called Braxton Hicks contractions, false labor, or practice contractions. °· Braxton Hicks contractions are usually shorter, weaker, farther apart, and less regular than true labor contractions. True labor contractions usually become progressively stronger and regular, and they become more frequent. °· Manage discomfort from Braxton Hicks contractions  by changing position, resting in a warm bath, drinking plenty of water, or practicing deep breathing. °This information is not intended to replace advice given to you by your health care provider. Make sure you discuss any questions you have with your health care provider. °Document Revised: 08/06/2017 Document Reviewed: 01/07/2017 °Elsevier Patient Education © 2020 [employer] ° °

## 2020-04-09 ENCOUNTER — Encounter: Payer: Self-pay | Admitting: Obstetrics and Gynecology

## 2020-04-09 ENCOUNTER — Other Ambulatory Visit: Payer: Self-pay | Admitting: Obstetrics and Gynecology

## 2020-04-09 ENCOUNTER — Other Ambulatory Visit: Payer: Self-pay

## 2020-04-09 ENCOUNTER — Ambulatory Visit (INDEPENDENT_AMBULATORY_CARE_PROVIDER_SITE_OTHER): Payer: Medicaid Other | Admitting: Obstetrics and Gynecology

## 2020-04-09 ENCOUNTER — Telehealth: Payer: Self-pay

## 2020-04-09 ENCOUNTER — Ambulatory Visit (INDEPENDENT_AMBULATORY_CARE_PROVIDER_SITE_OTHER): Payer: Medicaid Other

## 2020-04-09 ENCOUNTER — Other Ambulatory Visit: Payer: Self-pay | Admitting: Obstetrics & Gynecology

## 2020-04-09 VITALS — BP 128/84 | Wt 320.0 lb

## 2020-04-09 DIAGNOSIS — Z3A37 37 weeks gestation of pregnancy: Secondary | ICD-10-CM

## 2020-04-09 DIAGNOSIS — O30043 Twin pregnancy, dichorionic/diamniotic, third trimester: Secondary | ICD-10-CM | POA: Diagnosis not present

## 2020-04-09 DIAGNOSIS — O30049 Twin pregnancy, dichorionic/diamniotic, unspecified trimester: Secondary | ICD-10-CM

## 2020-04-09 DIAGNOSIS — O0993 Supervision of high risk pregnancy, unspecified, third trimester: Secondary | ICD-10-CM

## 2020-04-09 DIAGNOSIS — O99213 Obesity complicating pregnancy, third trimester: Secondary | ICD-10-CM

## 2020-04-09 NOTE — Telephone Encounter (Signed)
Pt called the after hour nurse 04/08/20 8:43pm; preg; having RLQ pain that causes her to double over; began this 8/2 am, went aware, and came back at 6:30-8pm; abd is getting firm when this happens.  After hour nurse adv pt to go to L&D; paged JEG; JEG adv she would call the pt back.  [phone]. I called pt who states it was the babies on an organ; they are fine and will see [place] at 1:30 today for her appt.

## 2020-04-09 NOTE — Progress Notes (Signed)
OB History & Physical   History of Present Illness:  Chief Complaint: here for induction of labor  HPI:  Crystal Hansen is a 29 y.o. G85P1001 female at [redacted]w[redacted]d dated by LMP consistent with a 13 week ultrsaound.  Her pregnancy has been complicated by di/di twin gestation, BMI >40 with weight gain to make BMI > 50 (she has been evaluated by anesthesia)..    She reports contractions.   She denies leakage of fluid.   She denies vaginal bleeding.   She reports fetal movement x 2.    Total weight gain for pregnancy: 60 lb (27.2 kg)   Obstetrical Problem List: pregnancy2  Problems (from 07/23/19 to present)    Problem Noted Resolved   Twin dichorionic diamniotic placenta 10/25/2019 by Nadara Mustard, MD No   Overview Addendum 12/11/2019 12:19 PM by Natale Milch, MD    Risks discussed Monitor for PTL        Previous Version   Supervision of high risk pregnancy, antepartum 10/24/2019 by Nadara Mustard, MD No   Overview Addendum 02/20/2020 10:14 AM by Natale Milch, MD    Clinic [facility] Prenatal Labs  Dating 13 weeks, Twins Blood type: A/Positive/-- (02/17 1052)   Genetic Screen NIPS:diploid Antibody:Negative (02/17 1052)  Anatomic [place] Normal, need PCI to complete[ ]    Rubella: <0.90 (02/17 1052) Varicella Immune  GTT Early:  96              Third trimester: 138 RPR: Non Reactive (02/17 1052)   Rhogam  not needed HBsAg: Negative (02/17 1052)   TDaP vaccine     02/20/2020      Flu Shot: HIV: Non Reactive (02/17 1052)   Baby Food   Bottle feeding                             GBS:   Contraception  declines Pap:10/24/19  CBB  No   CS/VBAC n/a   Support Person            Previous Version   Obesity affecting pregnancy in first trimester 10/24/2019 by Nadara Mustard, MD No   Overview Addendum 02/20/2020 10:16 AM by Natale Milch, MD    BMI >=40 [x ] early 1h gtt -  [x ] u/s for dating [ x]  [x ] nutritional goals [x ] folic acid 1mg  [ ]  bASA (>12 weeks) [  ] consider nutrition consult [ ]  consider maternal EKG 1st trimester [ ]  Growth u/s 28 [ ] , 32 [ ] , 36 weeks [ ]  [ ]  NST/AFI weekly 34+        Previous Version       Maternal Medical History:   Past Medical History:  Diagnosis Date  . Allergy   . Anxiety   . Arthritis   . Headache     Past Surgical History:  Procedure Laterality Date  . APPENDECTOMY      Allergies  Allergen Reactions  . Peanut Oil Anaphylaxis  . Shellfish Allergy Anaphylaxis  . Amoxapine And Related   . Apple Other (See Comments)  . Amoxicillin Rash    Prior to Admission medications   Medication Sig Start Date End Date Taking? Authorizing Provider  Butalbital-APAP-Caffeine 50-300-40 MG CAPS Take by mouth. 02/15/19   [provider]  Prenatal Vit-Fe Fumarate-FA (PRENATAL VITAMINS) 28-0.8 MG TABS Take 1 each by mouth daily. 12/11/19   Schuman, , MD  sucralfate (CARAFATE)  1 g tablet Take 1 tablet (1 g total) by mouth 4 (four) times daily -  with meals and at bedtime. Patient not taking: Reported on 04/07/2020 03/25/20   Natale Milch, MD    OB History  Gravida Para Term Preterm AB Living  2 1 1     1   SAB TAB Ectopic Multiple Live Births          1    # Outcome Date GA Lbr Len/2nd Weight Sex Delivery Anes PTL Lv  2 Current           1 Term 12/11/12 [redacted]w[redacted]d  8 lb 3 oz (3.714 kg) M Vag-Spont EPI  LIV    Prenatal care site: [facility]  Social History: She  reports that she has quit smoking. Her smoking use included cigarettes. She smoked 0.50 packs per day. She has never used smokeless tobacco. She reports previous alcohol use. She reports that she does not use drugs.  Family History: family history includes Anemia in her paternal grandmother; Diabetes in her maternal grandfather; Heart disease in her maternal grandfather; Hernia in her father; Hypertension in her father and maternal grandfather; Lung cancer in her maternal grandmother; Prostate cancer in her paternal  grandfather.   Review of Systems:  Review of Systems  Constitutional: Negative.   HENT: Negative.   Eyes: Negative.   Respiratory: Negative.   Cardiovascular: Negative.   Gastrointestinal: Negative.   Genitourinary: Negative.   Musculoskeletal: Negative.   Skin: Negative.   Neurological: Negative.   Psychiatric/Behavioral: Negative.      Physical Exam:  BP 128/84   Wt (!) 320 lb (145.2 kg)   LMP 07/23/2019 (Exact Date)   BMI 53.25 kg/m   Physical Exam Constitutional:      General: She is not in acute distress.    Appearance: Normal appearance. She is well-developed.  HENT:     Head: Normocephalic and atraumatic.  Eyes:     General: No scleral icterus.    Conjunctiva/sclera: Conjunctivae normal.  Cardiovascular:     Rate and Rhythm: Normal rate and regular rhythm.     Heart sounds: No murmur heard.  No friction rub. No gallop.   Pulmonary:     Effort: Pulmonary effort is normal. No respiratory distress.     Breath sounds: Normal breath sounds. No wheezing or rales.  Abdominal:     General: Bowel sounds are normal. There is no distension.     Palpations: Abdomen is soft.     Tenderness: There is no abdominal tenderness. There is no guarding or rebound.     Comments: Gravid, NT  Musculoskeletal:        General: Normal range of motion.     Cervical back: Normal range of motion and neck supple.  Neurological:     General: No focal deficit present.     Mental Status: She is alert and oriented to person, place, and time.     Cranial Nerves: No cranial nerve deficit.  Skin:    General: Skin is warm and dry.     Findings: No erythema.  Psychiatric:        Mood and Affect: Mood normal.        Behavior: Behavior normal.        Judgment: Judgment normal.    Cvx: 2/60/-3 (female chaperone present)   No results found for: SARSCOV2NAA  Assessment:  Crystal Hansen is a 29 y.o. G63P1001 female at [redacted]w[redacted]d with di-di twins, BMI >50, presenting for  induction of labor.    Plan:  1. Admit to Labor & Delivery  2. CBC, T&S, Clrs, IVF 3. GBS negative.   4. On last u/s twins were cephalic/cephalic.  Desires vaginal delivery.  Adequate pelvis.   5. Will start with cervical ripening unless cervix has changed sufficiently to warrant pitocin or AROM.   Thomasene Mohair, MD 04/09/2020 3:16 PM

## 2020-04-09 NOTE — Progress Notes (Signed)
Routine Prenatal Care Visit  Subjective  Crystal Hansen is a 29 y.o. G2P1001 at [redacted]w[redacted]d being seen today for ongoing prenatal care.  She is currently monitored for the following issues for this high-risk pregnancy and has Allergic rhinitis; Post concussion syndrome; Supervision of high risk pregnancy, antepartum; Obesity affecting pregnancy in first trimester; Twin dichorionic diamniotic placenta; Indication for care/intervention related to labor/delivery, antepartum; and Pregnancy related bilateral lower abdominal pain, antepartum on their problem list.  ----------------------------------------------------------------------------------- Patient reports no complaints.   Contractions: Irregular. Vag. Bleeding: None.  Movement: Present. Leaking Fluid denies.  U/S shows normal growth with no abnormal discordance.  AFI normal x 2.  ----------------------------------------------------------------------------------- The following portions of the patient's history were reviewed and updated as appropriate: allergies, current medications, past family history, past medical history, past social history, past surgical history and problem list. Problem list updated.  Objective  Blood pressure 128/84, weight (!) 320 lb (145.2 kg), last menstrual period 07/23/2019. Pregravid weight 260 lb (117.9 kg) Total Weight Gain 60 lb (27.2 kg) Urinalysis: Urine Protein    Urine Glucose    Fetal Status: Fetal Heart Rate (bpm): 139/147   Movement: Present  Presentation: Vertex  General:  Alert, oriented and cooperative. Patient is in no acute distress.  Skin: Skin is warm and dry. No rash noted.   Cardiovascular: Normal heart rate noted  Respiratory: Normal respiratory effort, no problems with respiration noted  Abdomen: Soft, gravid, appropriate for gestational age. Pain/Pressure: Present     Pelvic:  Cervical exam performed Dilation: 2 Effacement (%): 50 Station: -3  Extremities: Normal range of motion.      Mental Status: Normal mood and affect. Normal behavior. Normal judgment and thought content.   Imaging Results US OB Follow Up  Result Date: 04/09/2020 Patient Name: Crystal Hansen DOB: Apr 04, 1991 MRN: [id number] ULTRASOUND REPORT Location: [facility] Date of Service: 04/09/2020 Indications:growth/afi of Di/Di twins Findings: Twin A:  FHR at 139 BPM. Biometrics give an (U/S) Gestational age of [redacted]w[redacted]d and an (U/S) EDD of 04/27/2020; this correlates with the clinically established Estimated Date of Delivery: 04/28/20. Fetal presentation is Cephalic. Placenta: Anterior. Grade: 3 MVP: 8.1 cm Twin A: Growth percentile is 57.7%.  AC percentile is 77.8%. EFW: 3,200 g ( 7 lb 1 oz ) Twin B: FHR at 147 BPM. Biometrics give an (U/S) gestational age of [redacted]w[redacted]d and an (U/S) EDD of 05/01/2020;  This correlates with the clinically established estimated date of delivery: 04/28/2020. Fetal presentation is Cephalic. Pacenta: Left Lateral. Grade: 3 MVP: 6.0 cm Twin B: Growth percentile is 46.0%. AC percentile is 57.8% EFW: 3056 g ( 6 lb 12 oz ) Impression: 1. [redacted]w[redacted]d Viable Di/Di Twin pregnancy by previously established criteria. 2. Normal growth and amniotic fluid for both Twins. 3. The growth discordance between the two fetuses is 5%. Deanna Artis, RT The ultrasound images and findings were reviewed by me and I agree with the above report. Thomasene Mohair, MD, [facility], [facility] 04/09/2020 2:55 PM     US OB Follow Up AddL Gest  Result Date: 04/09/2020 Patient Name: Crystal Hansen DOB: [birth year] MRN: [id number] ULTRASOUND REPORT Location: [facility] Date of Service: 04/09/2020 Indications:growth/afi of Di/Di twins Findings: Twin A:  FHR at 139 BPM. Biometrics give an (U/S) Gestational age of [redacted]w[redacted]d and an (U/S) EDD of 04/27/2020; this correlates with the clinically established Estimated Date of Delivery: 04/28/20. Fetal presentation is Cephalic. Placenta: Anterior. Grade: 3 MVP: 8.1 cm  Twin A: Growth  percentile is 57.7%.  AC percentile is 77.8%. EFW: 3,200 g ( 7 lb 1 oz ) Twin B: FHR at 147 BPM. Biometrics give an (U/S) gestational age of [redacted]w[redacted]d and an (U/S) EDD of 05/01/2020;  This correlates with the clinically established estimated date of delivery: 04/28/2020. Fetal presentation is Cephalic. Pacenta: Left Lateral. Grade: 3 MVP: 6.0 cm Twin B: Growth percentile is 46.0%. AC percentile is 57.8% EFW: 3056 g ( 6 lb 12 oz ) Impression: 1. [redacted]w[redacted]d Viable Di/Di Twin pregnancy by previously established criteria. 2. Normal growth and amniotic fluid for both Twins. 3. The growth discordance between the two fetuses is 5%. Deanna Artis, RT The ultrasound images and findings were reviewed by me and I agree with the above report. Thomasene Mohair, MD, [facility], [facility] 04/09/2020 2:55 PM       Assessment   29 y.o. G2P1001 at [redacted]w[redacted]d by  04/28/2020, by Last Menstrual Period presenting for routine prenatal visit  Plan   pregnancy2  Problems (from 07/23/19 to present)    Problem Noted Resolved   Twin dichorionic diamniotic placenta 10/25/2019 by Nadara Mustard, MD No   Overview Addendum 12/11/2019 12:19 PM by Natale Milch, MD    Risks discussed Monitor for PTL        Previous Version   Supervision of high risk pregnancy, antepartum 10/24/2019 by Nadara Mustard, MD No   Overview Addendum 02/20/2020 10:14 AM by Natale Milch, MD    Clinic [facility] Prenatal Labs  Dating 13 weeks, Twins Blood type: A/Positive/-- (02/17 1052)   Genetic Screen NIPS:diploid Antibody:Negative (02/17 1052)  Anatomic [place] Normal, need PCI to complete[ ]    Rubella: <0.90 (02/17 1052) Varicella Immune  GTT Early:  96              Third trimester: 138 RPR: Non Reactive (02/17 1052)   Rhogam  not needed HBsAg: Negative (02/17 1052)   TDaP vaccine     02/20/2020      Flu Shot: HIV: Non Reactive (02/17 1052)   Baby Food   Bottle feeding                             GBS:    Contraception  declines Pap:10/24/19  CBB  No   CS/VBAC n/a   Support Person            Previous Version   Obesity affecting pregnancy in first trimester 10/24/2019 by Nadara Mustard, MD No   Overview Addendum 02/20/2020 10:16 AM by Natale Milch, MD    BMI >=40 [x ] early 1h gtt -  [x ] u/s for dating [ x]  [x ] nutritional goals [x ] folic acid 1mg  [ ]  bASA (>12 weeks) [ ]  consider nutrition consult [ ]  consider maternal EKG 1st trimester [ ]  Growth u/s 28 [ ] , 32 [ ] , 36 weeks [ ]  [ ]  NST/AFI weekly 34+        Previous Version       Term labor symptoms and general obstetric precautions including but not limited to vaginal bleeding, contractions, leaking of fluid and fetal movement were reviewed in detail with the patient. Please refer to After Visit Summary for other counseling recommendations.   IOL 8/9 at [facility]. Orders placed. Covid testing 8/6.    Return in about 1 week (around 04/16/2020) for U/S for AFI (TWINS) with Routine Prenatal Appointment.   Jean Rosenthal, MD, [facility], [facility] 04/09/2020 3:12 PM

## 2020-04-10 ENCOUNTER — Encounter: Payer: Self-pay | Admitting: Obstetrics & Gynecology

## 2020-04-10 ENCOUNTER — Observation Stay
Admission: RE | Admit: 2020-04-10 | Discharge: 2020-04-10 | Disposition: A | Discharge status: Home / Self Care | Payer: Medicaid Other | Attending: Internal Medicine | Admitting: Internal Medicine

## 2020-04-10 DIAGNOSIS — Z3A37 37 weeks gestation of pregnancy: Secondary | ICD-10-CM | POA: Diagnosis not present

## 2020-04-10 DIAGNOSIS — O0990 Supervision of high risk pregnancy, unspecified, unspecified trimester: Secondary | ICD-10-CM

## 2020-04-10 DIAGNOSIS — O30043 Twin pregnancy, dichorionic/diamniotic, third trimester: Secondary | ICD-10-CM | POA: Diagnosis not present

## 2020-04-10 DIAGNOSIS — O30049 Twin pregnancy, dichorionic/diamniotic, unspecified trimester: Secondary | ICD-10-CM | POA: Diagnosis present

## 2020-04-10 DIAGNOSIS — O99211 Obesity complicating pregnancy, first trimester: Secondary | ICD-10-CM

## 2020-04-10 NOTE — OB Triage Note (Signed)
Pt is here for her scheduled NST. Pt denies VB, LOF and states positive FM. EFM applied and baby A fht 135 and baby B FHT 125. Will continue to monitor.

## 2020-04-10 NOTE — OB Triage Note (Signed)
Provider reviewed NST results and discharge orders given to D/C monitors. Pt discharged home with instructions on when to return to labor and delivery for labor. Pt verbalized understanding and discharged home in stable condition.

## 2020-04-10 NOTE — Final Progress Note (Signed)
Physician Final Progress Note  Patient ID: Crystal Hansen MRN: [id number] DOB/AGE: November 04, 1990 29 y.o.  Admit date: 04/10/2020 Admitting provider: Nadara Mustard, MD/Nassir Neidert Sharen Hones, CNM Discharge date: 04/10/2020   Admission Diagnoses: Twin pregnancy, twins dichorionic and diamniotic  Discharge Diagnoses:  Active Problems: Twin pregnancy, twins dichorionic and diamniotic Reactive NST  Consults: None  Significant Findings/ Diagnostic Studies: Crystal Hansen is a 29 year old G2 P1001 at 14wk3d gestation with dichorionic diamniotic twins who presents for a NST.  Twin A on RLQ: Baseline 130 with accelerations to 150s to 160, moderate variability Twin B on left: Baseline 125 with accelerations to 150s, moderate variability  Toco: irritability only  A: IUP: Twin gestation at 65wk3d with reactive NST x 2   P: Labor precautions Is scheduled for IOL on 8/9 Will be getting Covid testing this week.  Procedures: NST x2  Discharge Condition: stable  Disposition: Discharge disposition: 01-Home or Self Care       Diet: Regular diet  Discharge Activity: Activity as tolerated   Allergies as of 04/10/2020      Reactions   Peanut Oil Anaphylaxis   Shellfish Allergy Anaphylaxis   Amoxapine And Related    Apple Other (See Comments)   Amoxicillin Rash      Medication List    TAKE these medications   Butalbital-APAP-Caffeine 50-300-40 MG Caps Take by mouth.   Prenatal Vitamins 28-0.8 MG Tabs Take 1 each by mouth daily.   sucralfate 1 g tablet Commonly known as: Carafate Take 1 tablet (1 g total) by mouth 4 (four) times daily -  with meals and at bedtime.        Total time spent taking care of this patient: 15 minutes  Signed: Farrel Conners 04/10/2020, 2:04 PM

## 2020-04-12 ENCOUNTER — Other Ambulatory Visit: Payer: Self-pay

## 2020-04-12 ENCOUNTER — Other Ambulatory Visit
Admission: RE | Admit: 2020-04-12 | Discharge: 2020-04-12 | Disposition: A | Discharge status: Home / Self Care | Payer: Medicaid Other | Source: Ambulatory Visit | Attending: Obstetrics and Gynecology | Admitting: Obstetrics and Gynecology

## 2020-04-12 DIAGNOSIS — Z01812 Encounter for preprocedural laboratory examination: Secondary | ICD-10-CM | POA: Insufficient documentation

## 2020-04-12 DIAGNOSIS — Z20822 Contact with and (suspected) exposure to covid-19: Secondary | ICD-10-CM | POA: Diagnosis not present

## 2020-04-13 LAB — SARS CORONAVIRUS 2 (TAT 6-24 HRS): SARS Coronavirus 2: NEGATIVE

## 2020-04-15 ENCOUNTER — Inpatient Hospital Stay: Payer: Medicaid Other | Admitting: Anesthesiology

## 2020-04-15 ENCOUNTER — Other Ambulatory Visit: Payer: Self-pay

## 2020-04-15 ENCOUNTER — Inpatient Hospital Stay
Admission: EM | Admit: 2020-04-15 | Discharge: 2020-04-18 | DRG: 787 | Disposition: A | Discharge status: Home / Self Care | Payer: Medicaid Other | Attending: Obstetrics & Gynecology | Admitting: Obstetrics & Gynecology

## 2020-04-15 ENCOUNTER — Encounter: Payer: Self-pay | Admitting: Obstetrics & Gynecology

## 2020-04-15 DIAGNOSIS — O30043 Twin pregnancy, dichorionic/diamniotic, third trimester: Principal | ICD-10-CM | POA: Diagnosis present

## 2020-04-15 DIAGNOSIS — Z87891 Personal history of nicotine dependence: Secondary | ICD-10-CM

## 2020-04-15 DIAGNOSIS — Z3A38 38 weeks gestation of pregnancy: Secondary | ICD-10-CM | POA: Diagnosis not present

## 2020-04-15 DIAGNOSIS — O30003 Twin pregnancy, unspecified number of placenta and unspecified number of amniotic sacs, third trimester: Secondary | ICD-10-CM | POA: Diagnosis present

## 2020-04-15 DIAGNOSIS — O0990 Supervision of high risk pregnancy, unspecified, unspecified trimester: Secondary | ICD-10-CM

## 2020-04-15 DIAGNOSIS — O99211 Obesity complicating pregnancy, first trimester: Secondary | ICD-10-CM

## 2020-04-15 DIAGNOSIS — O30049 Twin pregnancy, dichorionic/diamniotic, unspecified trimester: Secondary | ICD-10-CM | POA: Diagnosis present

## 2020-04-15 DIAGNOSIS — O99213 Obesity complicating pregnancy, third trimester: Secondary | ICD-10-CM | POA: Diagnosis not present

## 2020-04-15 LAB — CBC
HCT: 34.1 % — ABNORMAL LOW (ref 36.0–46.0)
Hemoglobin: 11.7 g/dL — ABNORMAL LOW (ref 12.0–15.0)
MCH: 28.1 pg (ref 26.0–34.0)
MCHC: 34.3 g/dL (ref 30.0–36.0)
MCV: 82 fL (ref 80.0–100.0)
Platelets: 386 10*3/uL (ref 150–400)
RBC: 4.16 MIL/uL (ref 3.87–5.11)
RDW: 14.7 % (ref 11.5–15.5)
WBC: 13.8 10*3/uL — ABNORMAL HIGH (ref 4.0–10.5)
nRBC: 0 % (ref 0.0–0.2)

## 2020-04-15 LAB — ABO/RH: ABO/RH(D): A POS

## 2020-04-15 LAB — TYPE AND SCREEN
ABO/RH(D): A POS
Antibody Screen: NEGATIVE

## 2020-04-15 MED ORDER — LIDOCAINE-EPINEPHRINE (PF) 1.5 %-1:200000 IJ SOLN
INTRAMUSCULAR | Status: DC | PRN
  Administered 2020-04-15: 3 mL via EPIDURAL

## 2020-04-15 MED ORDER — ONDANSETRON HCL 4 MG/2ML IJ SOLN
4.0000 mg | Freq: Four times a day (QID) | INTRAMUSCULAR | Status: DC | PRN
  Administered 2020-04-16 (×2): 4 mg via INTRAVENOUS
  Filled 2020-04-15 (×2): qty 2

## 2020-04-15 MED ORDER — LIDOCAINE HCL (PF) 1 % IJ SOLN
INTRAMUSCULAR | Status: DC | PRN
  Administered 2020-04-15: 3 mL via SUBCUTANEOUS

## 2020-04-15 MED ORDER — FENTANYL 2.5 MCG/ML W/ROPIVACAINE 0.15% IN NS 100 ML EPIDURAL (ARMC)
EPIDURAL | Status: AC
  Filled 2020-04-15: qty 100

## 2020-04-15 MED ORDER — OXYTOCIN-SODIUM CHLORIDE 30-0.9 UT/500ML-% IV SOLN
2.5000 [IU]/h | INTRAVENOUS | Status: DC
  Filled 2020-04-15: qty 500

## 2020-04-15 MED ORDER — SOD CITRATE-CITRIC ACID 500-334 MG/5ML PO SOLN: 30.0000 mL | ORAL | Status: DC | PRN

## 2020-04-15 MED ORDER — LACTATED RINGERS IV SOLN: INTRAVENOUS | Status: DC

## 2020-04-15 MED ORDER — OXYTOCIN-SODIUM CHLORIDE 30-0.9 UT/500ML-% IV SOLN
1.0000 m[IU]/min | INTRAVENOUS | Status: DC
  Administered 2020-04-15: 2 m[IU]/min via INTRAVENOUS
  Administered 2020-04-16: 14 m[IU]/min via INTRAVENOUS
  Filled 2020-04-15 (×2): qty 500

## 2020-04-15 MED ORDER — OXYTOCIN BOLUS FROM INFUSION: 333.0000 mL | Freq: Once | INTRAVENOUS | Status: DC

## 2020-04-15 MED ORDER — TERBUTALINE SULFATE 1 MG/ML IJ SOLN: 0.2500 mg | Freq: Once | INTRAMUSCULAR | Status: DC | PRN

## 2020-04-15 MED ORDER — MISOPROSTOL 25 MCG QUARTER TABLET: 25.0000 ug | ORAL_TABLET | ORAL | Status: DC | PRN

## 2020-04-15 MED ORDER — LACTATED RINGERS IV SOLN: 500.0000 mL | INTRAVENOUS | Status: DC | PRN

## 2020-04-15 MED ORDER — BUPIVACAINE HCL (PF) 0.25 % IJ SOLN
INTRAMUSCULAR | Status: DC | PRN
  Administered 2020-04-15 (×2): 4 mL via EPIDURAL

## 2020-04-15 MED ORDER — OXYTOCIN 10 UNIT/ML IJ SOLN: 10.0000 [IU] | Freq: Once | INTRAMUSCULAR | Status: DC

## 2020-04-15 MED ORDER — LIDOCAINE HCL (PF) 1 % IJ SOLN: 30.0000 mL | INTRAMUSCULAR | Status: DC | PRN

## 2020-04-15 MED ORDER — FENTANYL 2.5 MCG/ML W/ROPIVACAINE 0.15% IN NS 100 ML EPIDURAL (ARMC)
EPIDURAL | Status: DC | PRN
  Administered 2020-04-15: 12 mL/h via EPIDURAL
  Administered 2020-04-16 (×3): 250 ug via EPIDURAL

## 2020-04-15 NOTE — OB Triage Note (Signed)
Patient arrived at L&D for scheduled IOL of twin pregnancy

## 2020-04-15 NOTE — H&P (Signed)
History and Physical Interval Note:  04/15/2020 12:05 PM  Crystal Hansen  has presented today for INDUCTION OF LABOR (pitocin),  with the diagnosis of Twins at 38 weeks w favorable cervix. The various methods of treatment have been discussed with the patient and family. After consideration of risks, benefits and other options for treatment, the patient has consented to  Labor induction .  The patient's history has been reviewed, patient examined, no change in status, and is stable for induction as planned.  See H&P. I have reviewed the patient's chart and labs.  Questions were answered to the patient's satisfaction.    Clinic [facility] Prenatal Labs  Dating 13 weeks, Twins Blood type: A/Positive/-- (02/17 1052)   Genetic Screen NIPS:diploid Antibody:Negative (02/17 1052)  Anatomic [place] Normal, need PCI to complete[ ]    Rubella: <0.90 (02/17 1052) Varicella Immune  GTT Early:  96              Third trimester: 138 RPR: Non Reactive (02/17 1052)   Rhogam  not needed HBsAg: Negative (02/17 1052)   TDaP vaccine     02/20/2020      Flu Shot: HIV: Non Reactive (02/17 1052)   Baby Food   Bottle feeding                             GBS:   Contraception  declines Pap:10/24/19  CBB  No   CS/VBAC n/a    Exam and discussion now related to IOL. Cervix 3-4/80/-3 Plan Pitocin Plan Epidural later Anticipate vaginal delivery w double set up of twins.  Annamarie Major, MD, [facility], [facility] 04/15/2020  12:05 PM

## 2020-04-15 NOTE — Progress Notes (Signed)
  Labor Progress Note   29 y.o. G2P1001 @ [redacted]w[redacted]d , admitted for  Pregnancy, Labor Management of Twin IOL   Subjective:  Mild ctx pains Pitocin 16 mU/min  Objective:  BP (!) 121/56   Pulse 93   Temp 98.1 F (36.7 C) (Oral)   Resp 20   Ht 5' 5.5" (1.664 m)   Wt (!) 145.2 kg   LMP 07/23/2019 (Exact Date)   BMI 52.44 kg/m  Abd: gravid, ND, FHT present, mild tenderness on exam Extr: trace to 1+ bilateral pedal edema SVE: CERVIX: 5 cm dilated, 80 effaced, -2 station, presenting part Vtx of TWIN A VAGINA: no abnormalities noted MEMBRANES: ruptured, clear fluid  EFM: FHR: 130s for both twins bpm, variability: moderate,  accelerations:  Present,  decelerations:  Absent Toco: Frequency: Every 4-6 minutes Labs: I have reviewed the patient's lab results.   Assessment & Plan:  G2P1001 @ [redacted]w[redacted]d, admitted for  Pregnancy and Labor/Delivery Management  1. Pain management: none. 2. FWB: FHT category 1.  3. ID: GBS negative 4. Labor management: AROM clear Plan Epidural Double set up delivery discussed Cont Pitocin  All discussed with patient, see orders  Annamarie Major, MD, [facility], [facility] 04/15/2020  5:50 PM

## 2020-04-15 NOTE — Anesthesia Procedure Notes (Signed)
Epidural Patient location during procedure: OB Start time: 04/15/2020 6:11 PM End time: 04/15/2020 6:23 PM  Staffing Anesthesiologist: Naomie Dean, MD Resident/CRNA: Irving Burton, CRNA Performed: resident/CRNA   Preanesthetic Checklist Completed: patient identified, IV checked, site marked, risks and benefits discussed, surgical consent, monitors and equipment checked, pre-op evaluation and timeout performed  Epidural Patient position: sitting Prep: Betadine Patient monitoring: heart rate, continuous pulse ox and blood pressure Approach: midline Location: L4-L5 Injection technique: LOR air  Needle:  Needle type: Tuohy  Needle gauge: 17 G Needle length: 9 cm and 9 Needle insertion depth: 8 cm Catheter type: closed end flexible Catheter size: 19 Gauge Catheter at skin depth: 13 cm Test dose: negative and 1.5% lidocaine with Epi 1:200 K  Assessment Sensory level: T10 Events: blood not aspirated, injection not painful, no injection resistance, no paresthesia and negative IV test  Additional Notes Pt's history reviewed and consent obtained as per OB consent Patient tolerated the insertion well without complications. Negative SATD, negative IVTD All VSS were obtained and monitored through OBIX and nursing protocols followed.Reason for block:procedure for pain

## 2020-04-15 NOTE — Anesthesia Preprocedure Evaluation (Addendum)
Anesthesia Evaluation  Patient identified by MRN, date of birth, ID band Patient awake    Reviewed: Allergy & Precautions, H&P , NPO status , Patient's Chart, lab work & pertinent test results  History of Anesthesia Complications Negative for: history of anesthetic complications  Airway Mallampati: II  TM Distance: >3 FB Neck ROM: full    Dental   Pulmonary neg pulmonary ROS, former smoker,           Cardiovascular      Neuro/Psych  Headaches, PSYCHIATRIC DISORDERS Anxiety    GI/Hepatic   Endo/Other    Renal/GU      Musculoskeletal  (+) Arthritis , Osteoarthritis,    Abdominal   Peds  Hematology   Anesthesia Other Findings   Reproductive/Obstetrics (+) Pregnancy                            Anesthesia Physical Anesthesia Plan  ASA: II  Anesthesia Plan: Epidural   Post-op Pain Management:    Induction:   PONV Risk Score and Plan:   Airway Management Planned:   Additional Equipment:   Intra-op Plan:   Post-operative Plan:   Informed Consent: I have reviewed the patients History and Physical, chart, labs and discussed the procedure including the risks, benefits and alternatives for the proposed anesthesia with the patient or authorized representative who has indicated his/her understanding and acceptance.       Plan Discussed with: Anesthesiologist  Anesthesia Plan Comments: (Attending vaginal delivery in the OR.)       Anesthesia Quick Evaluation

## 2020-04-16 ENCOUNTER — Encounter: Payer: Self-pay | Admitting: Obstetrics and Gynecology

## 2020-04-16 ENCOUNTER — Encounter
Admission: EM | Disposition: A | Discharge status: Home / Self Care | Payer: Self-pay | Source: Home / Self Care | Attending: Obstetrics & Gynecology

## 2020-04-16 DIAGNOSIS — O99213 Obesity complicating pregnancy, third trimester: Secondary | ICD-10-CM

## 2020-04-16 DIAGNOSIS — Z3A38 38 weeks gestation of pregnancy: Secondary | ICD-10-CM

## 2020-04-16 DIAGNOSIS — O99211 Obesity complicating pregnancy, first trimester: Secondary | ICD-10-CM

## 2020-04-16 DIAGNOSIS — O30043 Twin pregnancy, dichorionic/diamniotic, third trimester: Principal | ICD-10-CM

## 2020-04-16 LAB — RPR: RPR Ser Ql: NONREACTIVE

## 2020-04-16 SURGERY — Surgical Case
Anesthesia: Spinal

## 2020-04-16 MED ORDER — GENTAMICIN SULFATE 40 MG/ML IJ SOLN
5.0000 mg/kg | INTRAVENOUS | Status: AC
  Administered 2020-04-16: 730 mg via INTRAVENOUS
  Filled 2020-04-16: qty 18.25

## 2020-04-16 MED ORDER — ZOLPIDEM TARTRATE 5 MG PO TABS: 5.0000 mg | ORAL_TABLET | Freq: Every evening | ORAL | Status: DC | PRN

## 2020-04-16 MED ORDER — IBUPROFEN 600 MG PO TABS
600.0000 mg | ORAL_TABLET | Freq: Four times a day (QID) | ORAL | Status: DC
  Administered 2020-04-17 – 2020-04-18 (×4): 600 mg via ORAL
  Filled 2020-04-16 (×4): qty 1

## 2020-04-16 MED ORDER — PHENYLEPHRINE HCL (PRESSORS) 10 MG/ML IV SOLN
INTRAVENOUS | Status: DC | PRN
  Administered 2020-04-16: 200 ug via INTRAVENOUS
  Administered 2020-04-16 (×3): 100 ug via INTRAVENOUS
  Administered 2020-04-16: 200 ug via INTRAVENOUS

## 2020-04-16 MED ORDER — DOCUSATE SODIUM 100 MG PO CAPS
100.0000 mg | ORAL_CAPSULE | Freq: Two times a day (BID) | ORAL | Status: DC
  Administered 2020-04-17 – 2020-04-18 (×3): 100 mg via ORAL
  Filled 2020-04-16 (×3): qty 1

## 2020-04-16 MED ORDER — DIPHENHYDRAMINE HCL 50 MG/ML IJ SOLN
INTRAMUSCULAR | Status: AC
  Filled 2020-04-16: qty 1

## 2020-04-16 MED ORDER — FENTANYL CITRATE (PF) 100 MCG/2ML IJ SOLN
INTRAMUSCULAR | Status: AC
  Filled 2020-04-16: qty 2

## 2020-04-16 MED ORDER — DIPHENOXYLATE-ATROPINE 2.5-0.025 MG PO TABS
ORAL_TABLET | ORAL | Status: AC
  Administered 2020-04-16: 1 via ORAL
  Filled 2020-04-16: qty 1

## 2020-04-16 MED ORDER — CLINDAMYCIN PHOSPHATE 900 MG/50ML IV SOLN
INTRAVENOUS | Status: AC
  Filled 2020-04-16: qty 50

## 2020-04-16 MED ORDER — BENZOCAINE-MENTHOL 20-0.5 % EX AERO: 1.0000 "application " | INHALATION_SPRAY | CUTANEOUS | Status: DC | PRN

## 2020-04-16 MED ORDER — ONDANSETRON HCL 4 MG PO TABS: 4.0000 mg | ORAL_TABLET | ORAL | Status: DC | PRN

## 2020-04-16 MED ORDER — FENTANYL 2.5 MCG/ML W/ROPIVACAINE 0.15% IN NS 100 ML EPIDURAL (ARMC)
EPIDURAL | Status: AC
  Filled 2020-04-16: qty 100

## 2020-04-16 MED ORDER — BUPIVACAINE IN DEXTROSE 0.75-8.25 % IT SOLN
INTRATHECAL | Status: DC | PRN
  Administered 2020-04-16: 1.6 mL via INTRATHECAL

## 2020-04-16 MED ORDER — CLINDAMYCIN PHOSPHATE 900 MG/50ML IV SOLN
900.0000 mg | INTRAVENOUS | Status: AC
  Administered 2020-04-16: 900 mg via INTRAVENOUS

## 2020-04-16 MED ORDER — OXYTOCIN-SODIUM CHLORIDE 30-0.9 UT/500ML-% IV SOLN
INTRAVENOUS | Status: DC | PRN
Start: 2020-04-16 — End: 2020-04-16
  Administered 2020-04-16: 300 mL via INTRAVENOUS

## 2020-04-16 MED ORDER — OXYTOCIN-SODIUM CHLORIDE 30-0.9 UT/500ML-% IV SOLN
2.5000 [IU]/h | INTRAVENOUS | Status: DC
  Administered 2020-04-17 (×2): 2.5 [IU]/h via INTRAVENOUS
  Filled 2020-04-16 (×2): qty 500

## 2020-04-16 MED ORDER — DIPHENHYDRAMINE HCL 25 MG PO CAPS: 25.0000 mg | ORAL_CAPSULE | Freq: Four times a day (QID) | ORAL | Status: DC | PRN

## 2020-04-16 MED ORDER — NALBUPHINE HCL 10 MG/ML IJ SOLN: 2.5000 mg | Freq: Four times a day (QID) | INTRAMUSCULAR | Status: DC | PRN

## 2020-04-16 MED ORDER — FENTANYL CITRATE (PF) 100 MCG/2ML IJ SOLN
INTRAMUSCULAR | Status: DC | PRN
  Administered 2020-04-16: 15 ug via INTRATHECAL

## 2020-04-16 MED ORDER — DIPHENOXYLATE-ATROPINE 2.5-0.025 MG PO TABS: 1.0000 | ORAL_TABLET | Freq: Once | ORAL | Status: AC

## 2020-04-16 MED ORDER — METHYLERGONOVINE MALEATE 0.2 MG/ML IJ SOLN
INTRAMUSCULAR | Status: DC | PRN
  Administered 2020-04-16: .2 mg via INTRAMUSCULAR

## 2020-04-16 MED ORDER — CARBOPROST TROMETHAMINE 250 MCG/ML IM SOLN
INTRAMUSCULAR | Status: DC | PRN
  Administered 2020-04-16: 250 ug via INTRAMUSCULAR

## 2020-04-16 MED ORDER — ONDANSETRON HCL 4 MG/2ML IJ SOLN: 4.0000 mg | Freq: Once | INTRAMUSCULAR | Status: DC | PRN

## 2020-04-16 MED ORDER — BUPIVACAINE HCL (PF) 0.5 % IJ SOLN
INTRAMUSCULAR | Status: AC
  Filled 2020-04-16: qty 30

## 2020-04-16 MED ORDER — MORPHINE SULFATE (PF) 2 MG/ML IV SOLN: 1.0000 mg | INTRAVENOUS | Status: AC | PRN

## 2020-04-16 MED ORDER — KETOROLAC TROMETHAMINE 30 MG/ML IJ SOLN: 15.0000 mg | Freq: Four times a day (QID) | INTRAMUSCULAR | Status: DC | PRN

## 2020-04-16 MED ORDER — SOD CITRATE-CITRIC ACID 500-334 MG/5ML PO SOLN
30.0000 mL | ORAL | Status: DC
  Filled 2020-04-16: qty 30

## 2020-04-16 MED ORDER — BUPIVACAINE HCL (PF) 0.5 % IJ SOLN
INTRAMUSCULAR | Status: DC | PRN
  Administered 2020-04-16: 10 mL

## 2020-04-16 MED ORDER — COCONUT OIL OIL: 1.0000 "application " | TOPICAL_OIL | Status: DC | PRN

## 2020-04-16 MED ORDER — CALCIUM CARBONATE ANTACID 500 MG PO CHEW
CHEWABLE_TABLET | ORAL | Status: AC
  Filled 2020-04-16: qty 2

## 2020-04-16 MED ORDER — CALCIUM CARBONATE ANTACID 500 MG PO CHEW
2.0000 | CHEWABLE_TABLET | Freq: Once | ORAL | Status: AC
  Administered 2020-04-16: 400 mg via ORAL

## 2020-04-16 MED ORDER — DIBUCAINE (PERIANAL) 1 % EX OINT: 1.0000 "application " | TOPICAL_OINTMENT | CUTANEOUS | Status: DC | PRN

## 2020-04-16 MED ORDER — SODIUM CHLORIDE 0.9 % IV SOLN
INTRAVENOUS | Status: DC | PRN
  Administered 2020-04-16: 50 ug/min via INTRAVENOUS

## 2020-04-16 MED ORDER — SIMETHICONE 80 MG PO CHEW: 80.0000 mg | CHEWABLE_TABLET | ORAL | Status: DC | PRN

## 2020-04-16 MED ORDER — FENTANYL CITRATE (PF) 100 MCG/2ML IJ SOLN: 25.0000 ug | INTRAMUSCULAR | Status: DC | PRN

## 2020-04-16 MED ORDER — ACETAMINOPHEN 500 MG PO TABS
1000.0000 mg | ORAL_TABLET | Freq: Four times a day (QID) | ORAL | Status: DC
  Administered 2020-04-17 – 2020-04-18 (×6): 1000 mg via ORAL
  Filled 2020-04-16 (×7): qty 2

## 2020-04-16 MED ORDER — ONDANSETRON HCL 4 MG/2ML IJ SOLN: 4.0000 mg | INTRAMUSCULAR | Status: DC | PRN

## 2020-04-16 MED ORDER — PRENATAL MULTIVITAMIN CH
1.0000 | ORAL_TABLET | Freq: Every day | ORAL | Status: DC
  Administered 2020-04-17 – 2020-04-18 (×2): 1 via ORAL
  Filled 2020-04-16 (×2): qty 1

## 2020-04-16 MED ORDER — ONDANSETRON HCL 4 MG/2ML IJ SOLN
INTRAMUSCULAR | Status: DC | PRN
  Administered 2020-04-16: 4 mg via INTRAVENOUS

## 2020-04-16 MED ORDER — BUPIVACAINE 0.25 % ON-Q PUMP DUAL CATH 400 ML
400.0000 mL | INJECTION | Status: DC
  Filled 2020-04-16: qty 400

## 2020-04-16 MED ORDER — DIPHENHYDRAMINE HCL 50 MG/ML IJ SOLN
50.0000 mg | Freq: Once | INTRAMUSCULAR | Status: AC
  Administered 2020-04-16: 50 mg via INTRAVENOUS

## 2020-04-16 MED ORDER — MORPHINE SULFATE (PF) 0.5 MG/ML IJ SOLN
INTRAMUSCULAR | Status: DC | PRN
  Administered 2020-04-16: .1 mg via INTRATHECAL

## 2020-04-16 MED ORDER — WITCH HAZEL-GLYCERIN EX PADS: 1.0000 "application " | MEDICATED_PAD | CUTANEOUS | Status: DC | PRN

## 2020-04-16 MED ORDER — MORPHINE SULFATE (PF) 0.5 MG/ML IJ SOLN
INTRAMUSCULAR | Status: AC
  Filled 2020-04-16: qty 10

## 2020-04-16 SURGICAL SUPPLY — 23 items
BOWL CEMENT MIXING ADV NOZZLE (MISCELLANEOUS) ×3 IMPLANT
CELL SAVER BLOOD TUBE (MISCELLANEOUS) ×3 IMPLANT
COUNTER NEEDLE 20/40 LG (NEEDLE) ×3 IMPLANT
COVER BACK TABLE 60X90IN (DRAPES) ×3 IMPLANT
COVER LIGHT HANDLE STERIS (MISCELLANEOUS) ×3 IMPLANT
COVER MAYO STAND STRL (DRAPES) ×3 IMPLANT
DERMABOND ADHESIVE PROPEN (GAUZE/BANDAGES/DRESSINGS) ×1
DERMABOND ADVANCED .7 DNX6 (GAUZE/BANDAGES/DRESSINGS) ×2 IMPLANT
DRAPE C-SECTION (MISCELLANEOUS) ×3 IMPLANT
DRAPE SHEET LG 3/4 BI-LAMINATE (DRAPES) ×3 IMPLANT
GRADUATE 1200CC STRL 31836 (MISCELLANEOUS) ×3 IMPLANT
HANDLE YANKAUER SUCT BULB TIP (MISCELLANEOUS) ×3 IMPLANT
KIT BASIN OR (CUSTOM PROCEDURE TRAY) ×3 IMPLANT
MARKER SKIN DUAL TIP RULER LAB (MISCELLANEOUS) ×3 IMPLANT
MARKER SKIN SURG W/RULER VIO (MISCELLANEOUS) ×3 IMPLANT
PACK DELIVERY ROOM (MISCELLANEOUS) ×3 IMPLANT
PENCIL SMOKE EVACUATOR (MISCELLANEOUS) ×3 IMPLANT
RETRACTOR TRAXI PANNICULUS (MISCELLANEOUS) ×2 IMPLANT
SPONGE LAP 18X18 RF (DISPOSABLE) ×3 IMPLANT
SUT PDS AB 1 TP1 96 (SUTURE) ×3 IMPLANT
SYR 10ML SLIP (SYRINGE) ×3 IMPLANT
SYR BULB IRRIG 60ML STRL (SYRINGE) ×3 IMPLANT
TRAXI PANNICULUS RETRACTOR (MISCELLANEOUS) ×1

## 2020-04-16 NOTE — Op Note (Signed)
Cesarean Section Procedure Note 04/16/20  Pre-operative Diagnosis:  1. Dichorionic-Diamniotic twin gestation   2. Arrest of descent  3. [redacted] week gestation Post-operative Diagnosis: same, delivered. Procedure: Primary Low Transverse Cesarean Section  Surgeon: Adelene Idler MD   Assistant(s): Vena Austria MD - No other skilled surgical assistant available. Anesthesia: Spinal Estimated Blood Loss: 1030 cc Complications: Hemorrhage   Disposition: PACU - hemodynamically stable. Condition: stable   Findings: Two female infant in the cephalic presentation. Amniotic fluid - clear  TWIN A:  Birth weight: 6 lbs 12oz Apgars of 9 and 9.  TWIN B: Birth weight: 7 lbs 15oz Apgars of 6 and 8.   Intact placenta with a three-vessel cord. Grossly normal uterus, tubes and ovaries bilaterally. No intraabdominal adhesions were noted.   Procedure Details    The patient was taken to operating room, identified as the correct patient and the procedure verified as C-Section Delivery. A time out was held and the above information confirmed. After induction of anesthesia, the patient was draped and prepped in the usual sterile manner. A Pfannenstiel incision was made and carried down through the subcutaneous tissue to the fascia. Fascial incision was made and extended transversely with the Mayo scissors. The fascia was separated from the underlying rectus tissue superiorly and inferiorly. The peritoneum was identified and entered bluntly. Peritoneal incision was extended longitudinally. A low transverse hysterotomy was made. The fetus A was delivered atraumatically. The umbilical cord was clamped x2 and cut and the infant was handed to the awaiting pediatricians.  The fetus B was then delivered atraumatically. The umbilical cord was clamped x2 and cut and the infant was handed to the awaiting pediatricians.  The placenta was removed intact and appeared normal with a 3-vessel cord. The uterus was  exteriorized and cleared of all clot and debris. The hysterotomy was closed with running sutures of 0 Vicryl suture. A second imbricating layer was placed with the same suture. Excellent hemostasis was observed.  The uterus was returned to the abdomen. The pelvis was irrigated and again, excellent hemostasis was noted. The peritoneum was closed with a running stitch of 2-0 Vicryl. The On Q Pain pump System was then placed.  Trocars were placed through the abdominal wall into the subfascial space and these were used to thread the silver soaker cathaters into place.The rectus muscles were inspected and were hemostatic. The rectus fascia was then reapproximated with running sutures of Looped PDS, with careful placement not to incorporate the cathaters. Subcutaneous tissues are then irrigated with saline and hemostasis assured with the bovie. The subcutaneous fat was approximated with 3-0 plain and a running stitch.  The skin was closed with 4-0 monocryl suture in a subcuticular fashion followed by skin adhesive. The cathaters are flushed each with 5 mL of Bupivicaine and stabilized into place with dressing. Instrument, sponge, and needle counts were correct prior to the abdominal closure and at the conclusion of the case.  The patient tolerated the procedure well and was transferred to the recovery room in stable condition.   Dr. Bonney Aid assisted in all portions of the procedure by retracting, aiding in exposure, applying fundal pressure.   Natale Milch MD [facility], [facility] 04/16/20 10:55 PM

## 2020-04-16 NOTE — Progress Notes (Signed)
FSE and IUPC removed by RN at this time.

## 2020-04-16 NOTE — Transfer of Care (Signed)
Immediate Anesthesia Transfer of Care Note  Patient: Crystal Hansen  Procedure(s) Performed: CESAREAN SECTION MULTI-GESTATIONAL  Patient Location: PACU  Anesthesia Type:Spinal  Level of Consciousness: awake, alert  and oriented  Airway & Oxygen Therapy: Patient Spontanous Breathing  Post-op Assessment: Report given to RN and Post -op Vital signs reviewed and stable  Post vital signs: stable  Last Vitals:  Vitals Value Taken Time  BP 127/71 04/16/20 2017  Temp 36.7 C 04/16/20 2017  Pulse    Resp 20 04/16/20 2017  SpO2      Last Pain:  Vitals:   04/16/20 2017  TempSrc: Oral  PainSc:          Complications: No complications documented.

## 2020-04-16 NOTE — Progress Notes (Signed)
  Labor Progress Note   29 y.o. G2P1001  IOL Twins 38 weeks  Subjective:  Pain this last 20 min  Pt has been on Pitocin, had one stop restart last evening, back up on Pitocin (28 mU/min now), has IUPC and FSE, clear fluid noted still and also blood show just now  Objective:  BP 126/61   Pulse 77   Temp 98 F (36.7 C) (Oral)   Resp 18   Ht 5' 5.5" (1.664 m)   Wt (!) 145.2 kg   LMP 07/23/2019 (Exact Date)   SpO2 94%   BMI 52.44 kg/m  Abd: gravid, ND, FHT present, moderate tenderness on exam Extr: trace to 1+ bilateral pedal edema SVE: CERVIX: 8+ cm dilated, 90 effaced, -2 station  EFM: FHR: 130 and 130 bpm, variability: moderate,  accelerations:  Present,  decelerations:  Absent Toco: Frequency: Every 2-3 minutes  Assessment & Plan:  G2P1001 @ [redacted]w[redacted]d, admitted for Twin IOL Pregnancy and Labor/Delivery Management  1. Pain management: epidural. 2. FWB: FHT category 1 for each twin.  3. ID: GBS negative 4. Labor management: Labor progressive despite relative slow development, always has had change in cervix each exam especially recently.  Anticipate continued progress to 10 cm and second stage of labor w planned double set up delivery.  All discussed with patient, see orders  Annamarie Major, MD, [facility], [facility] 04/16/2020  7:02 AM

## 2020-04-16 NOTE — Progress Notes (Signed)
SVE: 8-9cm/90/0 Clear fluid Pitocin decreased to 20 mu Continue with IOL. Patient making slow progress. Will recheck in 1-2 hours  Adelene Idler MD, [facility], [facility] 04/16/2020 3:01 PM

## 2020-04-16 NOTE — Discharge Summary (Addendum)
Postpartum Discharge Summary  Date of Service updated: 04/18/2020     Patient Name: Crystal Hansen DOB: 1990-11-06 MRN: [id number]  Date of admission: 04/15/2020 Delivery date:   Madalee, Altmann [[id number]]  04/16/2020    Jianna, [place]abik [[id number]]  04/16/2020   Delivering provider:    Deseray, Daponte Wadsworth [[id number]]  [place], [place], [place] [[id number]]  Adrian Prows R   Date of discharge: 04/18/2020  Admitting diagnosis: Twin gestation in third trimester [O30.003] Intrauterine pregnancy: [redacted]w[redacted]d    Secondary diagnosis:  Active Problems:   Twin dichorionic diamniotic placenta   Indication for care/intervention related to labor/delivery, antepartum   Twin gestation in third trimester   Twins, both liveborn   Postpartum care following cesarean delivery  Additional problems: Arrest of decent, cesarean section,     Discharge diagnosis: Term Pregnancy Delivered and PHawthorne                                             Post partum procedures: none Augmentation: AROM and Pitocin Complications: [id number] Hospital course: Induction of Labor With Cesarean Section   29y.o. yo G2P2003 at 365w2das admitted to the hospital 04/15/2020 for induction of labor. Patient had a labor course significant for arrest of decent. The patient went for cesarean section due to Arrest of Descent. Delivery details are as follows: Membrane Rupture Time/Date:    PeDaveda, Larock0[[id number]]5:45 PM    PeDynisha, Due0[[id number]]7:02 PM  ,   PeImogine, CarvellyCameron0[[id number]]04/15/2020    PeAasiyah, Auerbach0[[id number]]04/16/2020    Delivery Method:   PeGabrella, Stroh0[[id number]]C-Section, LoSeltzerransverse    PeAnie, Juniel0[[id number]]C-Section, Low Transverse   Details of operation can be found in separate operative Note.  Patient had an uncomplicated postpartum course.  She is ambulating, tolerating a regular diet, passing flatus, and urinating well.  Patient is discharged home in stable condition on 04/18/20.      Newborn Data: Birth date:   PeMorrigan, Wick[place]0[[id number]]04/16/2020    PeKathlyn, [place]0[[id number]]04/16/2020   Birth time:   PeRosland, Riding0[[id number]]7:02 PM    PeIvanka, Kirshner0[[id number]]7:03 PM   Gender:   PeLeinaala, Cat[place]0[[id number]]Female    PeMarybelle, GiraldoyAntioch0[[id number]]Female   Living status:   PeMandy, Peeks0[[id number]]  GOTLXB    WIOMBTDHRCBBoCampbellsburg0[[id number]]Living   Apgars:   PeMayvis, AgudeloyEpes0[[id number]]9 [address]Bo[address]0[[id number]]6 [place],   PeMarkayla, ReichartyByron0[[id number]]9 [place]0[[id number]]8   Weight:   PeSarinah, Doetschy[place]iver[place]0[[id number]]  6979    PeLa Moille0[[id number]]3600 g                                  Magnesium Sulfate received: No BMZ received: No Rhophylac:No MMR: patient undecided about vaccination T-DaP:Given prenatally Flu: No Transfusion:No  Physical exam  Vitals:   04/17/20 1900 04/17/20 2000 04/17/20 2311 04/18/20 0818  BP:   131/74 (!) 113/56  Pulse: 94 (!)  102 95 81  Resp:   20 18  Temp:   98.1 F (36.7 C) 98.3 F (36.8 C)  TempSrc:   Oral Oral  SpO2: 95% 96% 98% 98%  Weight:      Height:       General: alert, cooperative and no distress Lochia: appropriate Uterine Fundus: firm Incision: Healing well with no significant drainage, Dressing is clean, dry, and intact DVT Evaluation: No evidence of DVT seen on physical exam. Labs: Lab Results  Component Value Date   WBC 28.8 (H) 04/17/2020   HGB 9.4 (L) 04/17/2020   HCT 28.5 (L) 04/17/2020   MCV 84.8 04/17/2020   PLT 287 04/17/2020   No flowsheet data found. Edinburgh Score: Edinburgh Postnatal Depression Scale Screening Tool 04/18/2020  I have been able to  laugh and see the funny side of things. 0  I have looked forward with enjoyment to things. 0  I have blamed myself unnecessarily when things went wrong. 1  I have been anxious or worried for no good reason. 1  I have felt scared or panicky for no good reason. 1  Things have been getting on top of me. 1  I have been so unhappy that I have had difficulty sleeping. 0  I have felt sad or miserable. 1  I have been so unhappy that I have been crying. 1  The thought of harming myself has occurred to me. 0  Edinburgh Postnatal Depression Scale Total 6      After visit meds:  Allergies as of 04/18/2020      Reactions   Peanut Oil Anaphylaxis   Shellfish Allergy Anaphylaxis   Amoxapine And Related    Apple Other (See Comments)   Amoxicillin Rash      Medication List    STOP taking these medications   Butalbital-APAP-Caffeine 50-300-40 MG Caps   sucralfate 1 g tablet Commonly known as: Carafate     TAKE these medications   oxyCODONE 5 MG immediate release tablet Commonly known as: Oxy IR/ROXICODONE Take 1-2 tablets (5-10 mg total) by mouth every 6 (six) hours as needed for up to 5 days for severe pain or breakthrough pain.   Prenatal Vitamins 28-0.8 MG Tabs Take 1 each by mouth daily.            Discharge Care Instructions  (From admission, onward)         Start     Ordered   04/18/20 0000  Discharge wound care:       Comments: Keep incision dry, clean.   04/18/20 1118           Discharge home in stable condition Infant Feeding: Bottle Infant Disposition:home with mother Discharge instruction: per After Visit Summary and Postpartum booklet. Activity: Advance as tolerated. Pelvic rest for 6 weeks.  Diet: routine diet Anticipated Birth Control: Nexplanon Postpartum Appointment:1 week Additional Postpartum F/U: Incision check 1 week Future Appointments:No future appointments. Follow up Visit:  Follow-up Information    Schuman, Stefanie Libel, MD. Go in 1  week(s).   Specialty: Obstetrics and Gynecology Why: For incision check Contact information: [address] [place] [address] [phone]               04/18/2020 Rod Can, CNM

## 2020-04-16 NOTE — Anesthesia Procedure Notes (Signed)
Spinal  Start time: 04/16/2020 6:30 PM End time: 04/16/2020 6:38 PM Staffing Performed: resident/CRNA  Anesthesiologist: Yves Dill, MD Resident/CRNA: Irving Burton, CRNA Preanesthetic Checklist Completed: patient identified, IV checked, site marked, risks and benefits discussed, surgical consent, monitors and equipment checked and pre-op evaluation Spinal Block Patient position: sitting Prep: ChloraPrep Patient monitoring: cardiac monitor, continuous pulse ox and blood pressure Approach: midline Location: L3-4 Injection technique: single-shot Needle Needle type: Pencan  Needle gauge: 24 G Needle length: 9 cm

## 2020-04-16 NOTE — Progress Notes (Signed)
L&D Progress Note DAY#2 of IOL for didi twins  S: Hungry. Slept a little during the night.  O: BP 112/66   Pulse 75   Temp 97.9 F (36.6 C) (Oral)   Resp 18   Ht 5' 5.5" (1.664 m)   Wt (!) 145.2 kg   LMP 07/23/2019 (Exact Date)   SpO2 93%   BMI 52.44 kg/m   General: appears comfortable FHR: Twin A: baseline 125 with accelerations to 150, moderate variability Twin B: baseline 130 with accelerations to 150s to 160, moderate variability IUPC: not picking up well, contractions every 1-3 minutes apart on 28 miu Pitocin  Cervix was unchanged (8/70-80%/-1 to 0) except for some descent of Twin A.   Ultrasound: Twin A: OT position. Twin B cephalic   A: No change over 2 hours  P: DC Pitocin x 1 hour then restart at 14 miu/min Already received Benadryl 50 mgm IV for swollen anterior lip. Tums Position changes with peanut ball.  Farrel Conners, CNM

## 2020-04-16 NOTE — Procedures (Addendum)
Shared QBL 1060 with Dr. Jerene Pitch. Verbal order for Lomotil.

## 2020-04-16 NOTE — Progress Notes (Signed)
Pt transferred to OR at this time to start pushing.

## 2020-04-17 ENCOUNTER — Encounter: Payer: Self-pay | Admitting: Obstetrics and Gynecology

## 2020-04-17 LAB — CBC
HCT: 28.5 % — ABNORMAL LOW (ref 36.0–46.0)
Hemoglobin: 9.4 g/dL — ABNORMAL LOW (ref 12.0–15.0)
MCH: 28 pg (ref 26.0–34.0)
MCHC: 33 g/dL (ref 30.0–36.0)
MCV: 84.8 fL (ref 80.0–100.0)
Platelets: 287 10*3/uL (ref 150–400)
RBC: 3.36 MIL/uL — ABNORMAL LOW (ref 3.87–5.11)
RDW: 14.8 % (ref 11.5–15.5)
WBC: 28.8 10*3/uL — ABNORMAL HIGH (ref 4.0–10.5)
nRBC: 0 % (ref 0.0–0.2)

## 2020-04-17 MED ORDER — KETOROLAC TROMETHAMINE 30 MG/ML IJ SOLN
15.0000 mg | Freq: Four times a day (QID) | INTRAMUSCULAR | Status: DC | PRN
  Administered 2020-04-17 (×2): 15 mg via INTRAVENOUS
  Filled 2020-04-17 (×2): qty 1

## 2020-04-17 MED ORDER — OXYCODONE HCL 5 MG PO TABS
5.0000 mg | ORAL_TABLET | Freq: Once | ORAL | Status: AC
  Administered 2020-04-17: 5 mg via ORAL
  Filled 2020-04-17: qty 1

## 2020-04-17 MED ORDER — ONDANSETRON HCL 4 MG/2ML IJ SOLN: 4.0000 mg | Freq: Once | INTRAMUSCULAR | Status: DC | PRN

## 2020-04-17 MED ORDER — OXYCODONE HCL 5 MG PO TABS
5.0000 mg | ORAL_TABLET | ORAL | Status: DC | PRN
  Administered 2020-04-17 – 2020-04-18 (×3): 10 mg via ORAL
  Filled 2020-04-17 (×3): qty 2

## 2020-04-17 MED ORDER — NALBUPHINE HCL 10 MG/ML IJ SOLN: 2.5000 mg | Freq: Four times a day (QID) | INTRAMUSCULAR | Status: DC | PRN

## 2020-04-17 NOTE — Progress Notes (Signed)
Subjective: Postpartum Day 1: Cesarean Delivery Patient reports incisional pain, tolerating PO and + flatus.  She still has a foley in place as her urine output has been lower. Encouraged by RN staff to increase her wqter intake. Iv fluids continue. Her incsional pain is minimal- has an On -Q in place. Not wearing SCD today as she became extremely hot during the night. Plans to get up this morning once her foley is removed. She is bottle feeding her twins.  Objective: Vital signs in last 24 hours: Temp:  [97.4 F (36.3 C)-98.1 F (36.7 C)] 97.4 F (36.3 C) (08/11 0812) Pulse Rate:  [75-111] 100 (08/11 0910) Resp:  [12-29] 18 (08/11 0812) BP: (94-130)/(39-85) 99/46 (08/11 0812) SpO2:  [92 %-99 %] 96 % (08/11 0910)  Physical Exam:  General: alert, cooperative, appears stated age, no distress and moderately obese Lochia: appropriate Uterine Fundus: firm Incision: healing well, no significant drainage DVT Evaluation: No evidence of DVT seen on physical exam. Negative Homan's sign. No cords or calf tenderness.  Recent Labs    04/15/20 1337 04/17/20 0603  HGB 11.7* 9.4*  HCT 34.1* 28.5*    Assessment/Plan: Status post Cesarean section. Postoperative course complicated by decreased urine output. otherwise doing well.  Continue current care Encouraged to increase her oral intake of water. Place SCD hose on until ambulating. CBC in AM tomorrow.Mirna Mires 04/17/2020, 10:02 AM

## 2020-04-18 MED ORDER — OXYCODONE HCL 5 MG PO TABS: 5.0000 mg | ORAL_TABLET | Freq: Four times a day (QID) | ORAL | 0 refills | Status: AC | PRN

## 2020-04-18 NOTE — Discharge Instructions (Signed)
Discharge Instructions:   Follow-up Appointment: 1 week incision check, please schedule 6 week appointment  If there are any new medications, they have been ordered and will be available for pickup at the listed pharmacy on your way home from the hospital.   Call the office if you have any of the following: headache, visual changes, fever >101.0 F, chills, shortness of breath, breast concerns, excessive vaginal bleeding, incision drainage or problems, leg pain or redness, depression or any other concerns. If you have vaginal discharge with an odor, let your doctor know.   It is normal to bleed for up to 6 weeks. You should not soak through more than 1 pad in 1 hour. If you have a blood clot larger than your fist with continued bleeding, call your doctor.   After a c-section, you should expect a small amount of blood or clear fluid coming from the incision and abdominal cramping/soreness. Inspect your incision site daily. Stand in front of a mirror to look for any redness, incision opening, or discolored/odorness drainage. Take a shower daily and continue good hygiene. Use own towel and washcloth (do not share). Make sure your sheets on your bed are clean. No pets sleeping around your incision site. Dressing will be removed at your postpartum visit. If the dressing does become wet or soiled underneath, it is okay to remove it before your visit.    On-Q pump: You will remove on day 4 after insertion or if the ball becomes flat before day 4. You will remove on: 04/20/2020  Activity: Do not lift > 15 lbs for 6 weeks (do not lift anything heavier than your baby). No intercourse, tampons, swimming pools, hot tubs, baths (only showers) for 6 weeks.  No driving for 1-2 weeks. Do not drive while taking narcotic or opioid pain medication.  Continue taking your prenatal vitamin, especially if breastfeeding. Increase calories and fluids (water) while breastfeeding.   Your milk will come in, in the next couple  of days (right now it is colostrum). You may have a slight fever when your milk comes in, but it should go away on its own.  If it does not, and rises above 101 F please call the doctor. You will also feel achy and your breasts will be firm. They will also start to leak. If you are breastfeeding, continue as you have been and you can pump/express milk for comfort.   If you have too much milk, your breasts can become engorged, which could lead to mastitis. This is an infection of the milk ducts. It can be very painful and you will need to notify your doctor to obtain a prescription for antibiotics. You can also treat it with a shower or hot/cold compress.   For concerns about your baby, please call your pediatrician.  For breastfeeding concerns, the lactation consultant can be reached at [phone].   Postpartum blues (feelings of happy one minute and sad another minute) are normal for the first few weeks but if it gets worse let your doctor know.   Congratulations! We enjoyed caring for you and your new bundle of joy!

## 2020-04-18 NOTE — Progress Notes (Signed)
Pt discharged with infant. Discharge instructions, prescriptions, and follow up appointments given to and reviewed with patient. Incision care skit given to and reviewed with patient. Pt verbalized understanding. Escorted out by auxillary.

## 2020-04-18 NOTE — Anesthesia Postprocedure Evaluation (Signed)
Anesthesia Post Note  Patient: Crystal Hansen  Procedure(s) Performed: CESAREAN SECTION MULTI-GESTATIONAL  Patient location during evaluation: Mother Baby Anesthesia Type: Spinal Level of consciousness: oriented and awake and alert Pain management: pain level controlled Vital Signs Assessment: post-procedure vital signs reviewed and stable Respiratory status: spontaneous breathing and respiratory function stable Cardiovascular status: blood pressure returned to baseline and stable Postop Assessment: no headache, no backache, no apparent nausea or vomiting and able to ambulate Anesthetic complications: no   No complications documented.   Last Vitals:  Vitals:   04/17/20 2000 04/17/20 2311  BP:  131/74  Pulse: (!) 102 95  Resp:  20  Temp:  36.7 C  SpO2: 96% 98%    Last Pain:  Vitals:   04/18/20 0215  TempSrc:   PainSc: 8                  Crystal Hansen

## 2020-04-18 NOTE — Addendum Note (Signed)
Addendum  created 04/18/20 0726 by Stormy Fabian, CRNA   Clinical Note Signed

## 2020-04-26 ENCOUNTER — Other Ambulatory Visit: Payer: Self-pay

## 2020-04-26 ENCOUNTER — Encounter: Payer: Self-pay | Admitting: Obstetrics and Gynecology

## 2020-04-26 ENCOUNTER — Telehealth: Payer: Self-pay | Admitting: Obstetrics and Gynecology

## 2020-04-26 ENCOUNTER — Ambulatory Visit (INDEPENDENT_AMBULATORY_CARE_PROVIDER_SITE_OTHER): Payer: Medicaid Other | Admitting: Obstetrics and Gynecology

## 2020-04-26 VITALS — BP 128/88 | HR 77 | Ht 65.5 in | Wt 298.0 lb

## 2020-04-26 DIAGNOSIS — Z4889 Encounter for other specified surgical aftercare: Secondary | ICD-10-CM

## 2020-04-26 MED ORDER — OXYCODONE-ACETAMINOPHEN 5-325 MG PO TABS: 1.0000 | ORAL_TABLET | ORAL | 0 refills | Status: DC | PRN

## 2020-04-26 NOTE — Progress Notes (Signed)
      Postoperative Follow-up Patient presents post op from [facility] ago for failure to descend.  Subjective: Patient reports some improvement in her preop symptoms. Eating a regular diet without difficulty. Pain is controlled with current analgesics. Medications being used: prescription NSAID's including ibuprofen (Motrin) and narcotic analgesics including oxycodone/acetaminophen (Percocet, Tylox).  Activity: normal activities of daily living.  Objective: Blood pressure 128/88, pulse 77, height 5' 5.5" (1.664 m), weight 298 lb (135.2 kg), not currently breastfeeding.  General: NAD Pulmonary: no increased work of breathing Abdomen: soft, non-tender, non-distended, incision D/C/I Extremities: no edema Neurologic: normal gait    Admission on 04/15/2020, Discharged on 04/18/2020  Component Date Value Ref Range Status  . WBC 04/15/2020 13.8* 4.0 - 10.5 K/uL Final  . RBC 04/15/2020 4.16  3.87 - 5.11 MIL/uL Final  . Hemoglobin 04/15/2020 11.7* 12.0 - 15.0 g/dL Final  . HCT 07/04/2535 34.1* 36 - 46 % Final  . MCV 04/15/2020 82.0  80.0 - 100.0 fL Final  . MCH 04/15/2020 28.1  26.0 - 34.0 pg Final  . MCHC 04/15/2020 34.3  30.0 - 36.0 g/dL Final  . RDW 64/40/3474 14.7  11.5 - 15.5 % Final  . Platelets 04/15/2020 386  150 - 400 K/uL Final  . nRBC 04/15/2020 0.0  0.0 - 0.2 % Final   Performed at [facility], [address], [place], [place] [address]  . RPR Ser Ql 04/15/2020 NON REACTIVE  NON REACTIVE Final   Performed at [facility], [address] [place], [place] [address]  . ABO/RH(D) 04/15/2020 A POS   Final  . Antibody Screen 04/15/2020 NEG   Final  . Sample Expiration 04/15/2020    Final                   Value:04/18/2020,2359 Performed at [facility], [address] [place], [place] [address]   . ABO/RH(D) 04/15/2020    Final                   Value:A POS Performed at [facility], [address], [place], [place] [address]   . WBC  04/17/2020 28.8* 4.0 - 10.5 K/uL Final  . RBC 04/17/2020 3.36* 3.87 - 5.11 MIL/uL Final  . Hemoglobin 04/17/2020 9.4* 12.0 - 15.0 g/dL Final  . HCT 16/60/6301 28.5* 36 - 46 % Final  . MCV 04/17/2020 84.8  80.0 - 100.0 fL Final  . MCH 04/17/2020 28.0  26.0 - 34.0 pg Final  . MCHC 04/17/2020 33.0  30.0 - 36.0 g/dL Final  . RDW 60/06/9322 14.8  11.5 - 15.5 % Final  . Platelets 04/17/2020 287  150 - 400 K/uL Final  . nRBC 04/17/2020 0.0  0.0 - 0.2 % Final   Performed at [facility], [address] [place], [place] [address]    Assessment: 29 y.o. s/p LTCS twins stable  Plan: Patient has done well after surgery with no apparent complications.  I have discussed the post-operative course to date, and the expected progress moving forward.  The patient understands what complications to be concerned about.  I will see the patient in routine follow up, or sooner if needed.    Activity plan: No heavy lifting.  Interested in Nexplanon for postpartum contraception  Refill percocet   Vena Austria, MD, [facility], [facility] 04/26/2020, 3:03 PM

## 2020-04-26 NOTE — Telephone Encounter (Signed)
nexplanon with CRS 9/24 at 130

## 2020-05-01 NOTE — Telephone Encounter (Signed)
Noted. Will order to arrive by apt date/time. 

## 2020-05-03 ENCOUNTER — Other Ambulatory Visit: Payer: Self-pay

## 2020-05-03 ENCOUNTER — Encounter: Payer: Self-pay | Admitting: Obstetrics and Gynecology

## 2020-05-03 ENCOUNTER — Inpatient Hospital Stay: Payer: Medicaid Other

## 2020-05-03 ENCOUNTER — Inpatient Hospital Stay
Admission: AD | Admit: 2020-05-03 | Discharge: 2020-05-05 | DRG: 776 | Disposition: A | Discharge status: Home / Self Care | Payer: Medicaid Other | Source: Ambulatory Visit | Attending: Obstetrics and Gynecology | Admitting: Obstetrics and Gynecology

## 2020-05-03 ENCOUNTER — Ambulatory Visit (INDEPENDENT_AMBULATORY_CARE_PROVIDER_SITE_OTHER): Payer: Medicaid Other | Admitting: Obstetrics and Gynecology

## 2020-05-03 ENCOUNTER — Telehealth: Payer: Self-pay

## 2020-05-03 VITALS — BP 120/78 | Temp 97.8°F | Wt 279.0 lb

## 2020-05-03 DIAGNOSIS — O8600 Infection of obstetric surgical wound, unspecified: Secondary | ICD-10-CM | POA: Diagnosis present

## 2020-05-03 DIAGNOSIS — O8601 Infection of obstetric surgical wound, superficial incisional site: Secondary | ICD-10-CM | POA: Diagnosis not present

## 2020-05-03 DIAGNOSIS — L089 Local infection of the skin and subcutaneous tissue, unspecified: Secondary | ICD-10-CM

## 2020-05-03 DIAGNOSIS — Z20822 Contact with and (suspected) exposure to covid-19: Secondary | ICD-10-CM | POA: Diagnosis present

## 2020-05-03 DIAGNOSIS — Z98891 History of uterine scar from previous surgery: Secondary | ICD-10-CM

## 2020-05-03 DIAGNOSIS — T148XXA Other injury of unspecified body region, initial encounter: Secondary | ICD-10-CM

## 2020-05-03 HISTORY — DX: Infection of obstetric surgical wound, unspecified: O86.00

## 2020-05-03 LAB — CBC WITH DIFFERENTIAL/PLATELET
Abs Immature Granulocytes: 0.07 10*3/uL (ref 0.00–0.07)
Basophils Absolute: 0.1 10*3/uL (ref 0.0–0.1)
Basophils Relative: 1 %
Eosinophils Absolute: 0.4 10*3/uL (ref 0.0–0.5)
Eosinophils Relative: 3 %
HCT: 32.7 % — ABNORMAL LOW (ref 36.0–46.0)
Hemoglobin: 10.6 g/dL — ABNORMAL LOW (ref 12.0–15.0)
Immature Granulocytes: 1 %
Lymphocytes Relative: 30 %
Lymphs Abs: 3.1 10*3/uL (ref 0.7–4.0)
MCH: 26.2 pg (ref 26.0–34.0)
MCHC: 32.4 g/dL (ref 30.0–36.0)
MCV: 80.9 fL (ref 80.0–100.0)
Monocytes Absolute: 0.6 10*3/uL (ref 0.1–1.0)
Monocytes Relative: 5 %
Neutro Abs: 6.4 10*3/uL (ref 1.7–7.7)
Neutrophils Relative %: 60 %
Platelets: 506 10*3/uL — ABNORMAL HIGH (ref 150–400)
RBC: 4.04 MIL/uL (ref 3.87–5.11)
RDW: 13.7 % (ref 11.5–15.5)
WBC: 10.6 10*3/uL — ABNORMAL HIGH (ref 4.0–10.5)
nRBC: 0 % (ref 0.0–0.2)

## 2020-05-03 LAB — BASIC METABOLIC PANEL
Anion gap: 14 (ref 5–15)
BUN: 11 mg/dL (ref 6–20)
CO2: 25 mmol/L (ref 22–32)
Calcium: 9.4 mg/dL (ref 8.9–10.3)
Chloride: 102 mmol/L (ref 98–111)
Creatinine, Ser: 0.79 mg/dL (ref 0.44–1.00)
GFR calc Af Amer: >60 mL/min (ref >60)
GFR calc non Af Amer: >60 mL/min (ref >60)
Glucose, Bld: 96 mg/dL (ref 70–99)
Potassium: 3.6 mmol/L (ref 3.5–5.1)
Sodium: 141 mmol/L (ref 135–145)

## 2020-05-03 LAB — SARS CORONAVIRUS 2 BY RT PCR (HOSPITAL ORDER, PERFORMED IN ~~LOC~~ HOSPITAL LAB): SARS Coronavirus 2: NEGATIVE

## 2020-05-03 MED ORDER — VANCOMYCIN HCL IN DEXTROSE 1-5 GM/200ML-% IV SOLN
1000.0000 mg | Freq: Once | INTRAVENOUS | Status: AC
  Administered 2020-05-03: 1000 mg via INTRAVENOUS
  Filled 2020-05-03 (×2): qty 200

## 2020-05-03 MED ORDER — ACETAMINOPHEN 325 MG PO TABS: 650.0000 mg | ORAL_TABLET | Freq: Four times a day (QID) | ORAL | Status: DC | PRN

## 2020-05-03 MED ORDER — HYDROCODONE-ACETAMINOPHEN 5-325 MG PO TABS: 1.0000 | ORAL_TABLET | ORAL | Status: DC | PRN

## 2020-05-03 MED ORDER — SODIUM CHLORIDE 0.9 % IV SOLN
1.0000 g | Freq: Three times a day (TID) | INTRAVENOUS | Status: DC
  Administered 2020-05-03 – 2020-05-04 (×3): 1 g via INTRAVENOUS
  Filled 2020-05-03 (×7): qty 1

## 2020-05-03 MED ORDER — DOCUSATE SODIUM 100 MG PO CAPS
100.0000 mg | ORAL_CAPSULE | Freq: Two times a day (BID) | ORAL | Status: DC
  Filled 2020-05-03 (×3): qty 1

## 2020-05-03 MED ORDER — DIPHENHYDRAMINE HCL 25 MG PO CAPS
25.0000 mg | ORAL_CAPSULE | Freq: Four times a day (QID) | ORAL | Status: DC | PRN
  Administered 2020-05-04: 25 mg via ORAL
  Filled 2020-05-03: qty 1

## 2020-05-03 MED ORDER — VANCOMYCIN HCL IN DEXTROSE 1-5 GM/200ML-% IV SOLN
1000.0000 mg | Freq: Three times a day (TID) | INTRAVENOUS | Status: DC
  Administered 2020-05-04: 1000 mg via INTRAVENOUS
  Filled 2020-05-03 (×3): qty 200

## 2020-05-03 MED ORDER — VANCOMYCIN HCL IN DEXTROSE 1-5 GM/200ML-% IV SOLN
1000.0000 mg | Freq: Once | INTRAVENOUS | Status: DC
  Filled 2020-05-03: qty 200

## 2020-05-03 MED ORDER — ADULT MULTIVITAMIN W/MINERALS CH
1.0000 | ORAL_TABLET | Freq: Every day | ORAL | Status: DC
  Administered 2020-05-04 – 2020-05-05 (×2): 1 via ORAL
  Filled 2020-05-03 (×2): qty 1

## 2020-05-03 MED ORDER — LACTATED RINGERS IV SOLN: INTRAVENOUS | Status: DC

## 2020-05-03 MED ORDER — ACETAMINOPHEN 650 MG RE SUPP: 650.0000 mg | Freq: Four times a day (QID) | RECTAL | Status: DC | PRN

## 2020-05-03 NOTE — Telephone Encounter (Signed)
Patient reports she had a c-section 04/16/20 and her incision is draining. "It's an gooey, wet moistness coming from incision" [phone]

## 2020-05-03 NOTE — Telephone Encounter (Signed)
Spoke w/patient. She reports the drainage is clear now, but was green earlier and has a smell. She hasn't had known fever but did wake up hot this morning.

## 2020-05-03 NOTE — Telephone Encounter (Signed)
yes

## 2020-05-03 NOTE — H&P (Signed)
History and Physical Interval Note:  05/03/2020 10:23 PM  Crystal Hansen  has presented today for incisional drainage 17 days after CS for twin delivery, otherwise has been healing well, and has no reported pain or fever, risk factor of obesity,  with the diagnosis of wound infection (incisional). The various methods of treatment have been discussed with the patient and family.   The patient's history has been reviewed, patient examined, no change in status.  See H&P. I have reviewed the patient's chart and labs.  Questions were answered to the patient's satisfaction.    CT and labs reviewed.  ABX started recently. Incision re-examined and no change in amount of drainage, erythema, or tenderness (none).  Annamarie Major, MD, [facility], [facility] 05/03/2020  10:23 PM

## 2020-05-03 NOTE — Progress Notes (Signed)
Crystal Hansen is an 29 y.o. female.   Chief Complaint: Drainage from incision HPI: Reports that today she began having drainage from her incision. She denies fevers. She reports that for the last week the incision has been swollen firm and red at the midline. She had a cesarean delivery on 04/16/2020.  Her postpartum course has been thus far uncomplicated.    Past Medical History:  Diagnosis Date  . Allergy   . Anxiety   . Arthritis   . Headache     Past Surgical History:  Procedure Laterality Date  . APPENDECTOMY    . CESAREAN SECTION MULTI-GESTATIONAL  04/16/2020   Procedure: CESAREAN SECTION MULTI-GESTATIONAL;  Surgeon: Natale Milch, MD;  Location: [facility] ORS;  Service: Obstetrics;;    Family History  Problem Relation Age of Onset  . Hernia Father   . Hypertension Father   . Lung cancer Maternal Grandmother   . Heart disease Maternal Grandfather   . Diabetes Maternal Grandfather   . Hypertension Maternal Grandfather   . Anemia Paternal Grandmother   . Prostate cancer Paternal Grandfather    Social History:  reports that she has quit smoking. Her smoking use included cigarettes. She smoked 0.50 packs per day. She has never used smokeless tobacco. She reports previous alcohol use. She reports that she does not use drugs.  Allergies:  Allergies  Allergen Reactions  . Peanut Oil Anaphylaxis  . Shellfish Allergy Anaphylaxis  . Amoxapine And Related   . Apple Other (See Comments)  . Amoxicillin Rash    (Not in a hospital admission)   No results found for this or any previous visit (from the past 48 hour(s)). No results found.  Review of Systems  Constitutional: Negative for chills and fever.  HENT: Negative for congestion, hearing loss and sinus pain.   Respiratory: Negative for cough, shortness of breath and wheezing.   Cardiovascular: Negative for chest pain, palpitations and leg swelling.  Gastrointestinal: Negative for abdominal pain, constipation,  diarrhea, nausea and vomiting.  Genitourinary: Negative for dysuria, flank pain, frequency, hematuria and urgency.  Musculoskeletal: Negative for back pain.  Skin: Negative for rash.  Neurological: Negative for dizziness and headaches.  Psychiatric/Behavioral: Negative for suicidal ideas. The patient is not nervous/anxious.     Blood pressure 120/78, temperature 97.8 F (36.6 C), weight 279 lb (126.6 kg), not currently breastfeeding. Physical Exam Vitals and nursing note reviewed.  Constitutional:      Appearance: She is well-developed.  HENT:     Head: Normocephalic and atraumatic.  Cardiovascular:     Rate and Rhythm: Normal rate and regular rhythm.  Pulmonary:     Effort: Pulmonary effort is normal.     Breath sounds: Normal breath sounds.  Abdominal:     General: Abdomen is flat. Bowel sounds are normal. There is no distension.     Palpations: Abdomen is soft.     Tenderness: There is abdominal tenderness. There is no guarding or rebound.    Musculoskeletal:        General: Normal range of motion.  Skin:    General: Skin is warm and dry.  Neurological:     Mental Status: She is alert and oriented to person, place, and time.  Psychiatric:        Behavior: Behavior normal.        Thought Content: Thought content normal.        Judgment: Judgment normal.      Assessment/Plan 29 yo s/p cesarean section 17 days  ago with wound infection  Will admit for broad spectrum antibiotics with Meropenem and Vancomycin Wound Culture collected CBC and BMP Will obtain CT for assesment of abscess.  Discussed that I&D with wound vac placement may be necessary.  Cleanse wound every 4 hours with soap and water Encouraged Hibiclens usage.  She is bottle feeding her twins.   More than 25 minutes were spent face to face with the patient in the room, reviewing the medical record, labs and images, and coordinating care for the patient. The plan of management was discussed in detail and  counseling was provided.     Natale Milch, MD 05/03/2020, 3:36 PM

## 2020-05-03 NOTE — Consult Note (Signed)
Pharmacy Antibiotic Note  Crystal Hansen is a 29 y.o. female admitted on 05/03/2020 with cellulitis.  Pharmacy has been consulted for vancomycin dosing.  Plan: Vancomycin 2000 mg (loading dose) followed by vancomycin 1000 mg q8H per  nomogram. Plan to get vancomycin level in 2-3 days.      Temp (24hrs), Avg:97.8 F (36.6 C), Min:97.8 F (36.6 C), Max:97.8 F (36.6 C)  No results for input(s): WBC, CREATININE, LATICACIDVEN, VANCOTROUGH, VANCOPEAK, VANCORANDOM, GENTTROUGH, GENTPEAK, GENTRANDOM, TOBRATROUGH, TOBRAPEAK, TOBRARND, AMIKACINPEAK, AMIKACINTROU, AMIKACIN in the last 168 hours.  CrCl cannot be calculated (No successful lab value found.).    Allergies  Allergen Reactions   Peanut Oil Anaphylaxis   Shellfish Allergy Anaphylaxis   Amoxapine And Related    Apple Other (See Comments)   Amoxicillin Rash    /Antimicrobials this admission: 8/27 vancomycin >>   Dose adjustments this admission: None  Microbiology results: 8/27 Wound Cx: pending  Thank you for allowing pharmacy to be a part of this patients care.  Ronnald Ramp, PharmD, BCPS 05/03/2020 4:57 PM

## 2020-05-03 NOTE — Telephone Encounter (Signed)
PER CRS before lunch is fine. Per Cleda Daub pt can't come til after 2 and is scheduled for 3:15

## 2020-05-04 LAB — CBC
HCT: 33.2 % — ABNORMAL LOW (ref 36.0–46.0)
Hemoglobin: 10.5 g/dL — ABNORMAL LOW (ref 12.0–15.0)
MCH: 26.3 pg (ref 26.0–34.0)
MCHC: 31.6 g/dL (ref 30.0–36.0)
MCV: 83 fL (ref 80.0–100.0)
Platelets: 496 10*3/uL — ABNORMAL HIGH (ref 150–400)
RBC: 4 MIL/uL (ref 3.87–5.11)
RDW: 13.5 % (ref 11.5–15.5)
WBC: 10.6 10*3/uL — ABNORMAL HIGH (ref 4.0–10.5)
nRBC: 0 % (ref 0.0–0.2)

## 2020-05-04 MED ORDER — SULFAMETHOXAZOLE-TRIMETHOPRIM 800-160 MG PO TABS
1.0000 | ORAL_TABLET | Freq: Two times a day (BID) | ORAL | Status: DC
  Administered 2020-05-04 – 2020-05-05 (×2): 1 via ORAL
  Filled 2020-05-04 (×3): qty 1

## 2020-05-04 MED ORDER — VANCOMYCIN HCL IN DEXTROSE 1-5 GM/200ML-% IV SOLN
1000.0000 mg | Freq: Three times a day (TID) | INTRAVENOUS | Status: DC
  Administered 2020-05-04 (×2): 1000 mg via INTRAVENOUS
  Filled 2020-05-04 (×4): qty 200

## 2020-05-04 NOTE — Progress Notes (Signed)
Daily Benign Gynecology Progress Note MYLEI BRACKEEN  [id number]  HD#2  Subjective:  Overnight Events: no acute events Complaints: none current She denies: fevers, chills, chest pain, trouble breathing, nausea, vomiting, severe abdominal pain.  She has tolerated: normal diet as tolerated She reports her pain is well controlled with oral pain medications.   She is ambulating and is voiding.   Objective:  Most recent vitals Temp: 98 F (36.7 C)  BP: 127/83  Pulse Rate: 99  Resp: 18  SpO2: 99 %   Vitals Range over 24 hours Temp  Avg: 98.1 F (36.7 C)  Min: 97.7 F (36.5 C)  Max: 98.6 F (37 C) BP  Min: 117/56  Max: 154/67 Pulse  Avg: 87.8  Min: 74  Max: 100 SpO2  Avg: 97.2 %  Min: 95 %  Max: 99 %   Physical Exam Physical Exam Constitutional:      General: She is not in acute distress.    Appearance: Normal appearance.  HENT:     Head: Normocephalic and atraumatic.  Eyes:     General: No scleral icterus.    Conjunctiva/sclera: Conjunctivae normal.  Cardiovascular:     Rate and Rhythm: Normal rate and regular rhythm.     Heart sounds: No murmur heard.   Pulmonary:     Effort: Pulmonary effort is normal.     Breath sounds: Normal breath sounds. No wheezing, rhonchi or rales.  Abdominal:     General: There is no distension.     Palpations: Abdomen is soft.     Tenderness: There is no abdominal tenderness.    Musculoskeletal:        General: No swelling. Normal range of motion.  Neurological:     General: No focal deficit present.     Mental Status: She is oriented to person, place, and time.     Cranial Nerves: No cranial nerve deficit.  Skin:    General: Skin is warm and dry.     Coloration: Skin is not jaundiced.  Psychiatric:        Mood and Affect: Mood normal.        Behavior: Behavior normal.        Judgment: Judgment normal.  Vitals reviewed.      AM Labs Lab Results  Component Value Date   WBC 10.6 (H) 05/04/2020   HGB 10.5 (L) 05/04/2020     HCT 33.2 (L) 05/04/2020   PLT 496 (H) 05/04/2020   NA 141 05/03/2020   K 3.6 05/03/2020   CREATININE 0.79 05/03/2020   BUN 11 05/03/2020    Wound culture: pending  Imaging Results CT ABDOMEN PELVIS WO CONTRAST  Result Date: 05/03/2020 CLINICAL DATA:  Prior C-section.  Draining from incision. EXAM: CT ABDOMEN AND PELVIS WITHOUT CONTRAST TECHNIQUE: Multidetector CT imaging of the abdomen and pelvis was performed following the standard protocol without IV contrast. COMPARISON:  None. FINDINGS: Lower chest: Lung bases are clear. No effusions. Heart is normal size. Hepatobiliary: No focal hepatic abnormality. Gallbladder unremarkable. Pancreas: No focal abnormality or ductal dilatation. Spleen: No focal abnormality.  Normal size. Adrenals/Urinary Tract: No adrenal abnormality. No focal renal abnormality. No stones or hydronephrosis. Urinary bladder is unremarkable. Stomach/Bowel: Stomach, large and small bowel grossly unremarkable. Normal appendix. Vascular/Lymphatic: No evidence of aneurysm or adenopathy. Reproductive: Small amount of gas noted within uterus, likely endometrium, likely related to recent C-section. Uterus is prominent compatible with recent postpartum state. Other: No free fluid or free air. Stranding in the subcutaneous  soft tissues in the lower abdominal wall related to recent C-section. No focal fluid collection. Musculoskeletal: No acute bony abnormality. IMPRESSION: Changes of recent C-section with stranding in the anterior abdominal wall. No focal fluid collection. Electronically Signed   By: Charlett Nose M.D.   On: 05/03/2020 21:16      Assessment:  Crystal Hansen is a 29 y.o. female HD#2 admitted with superficial surgical site infection.  No evidence on CT of large abscess, though draining purulent material still..    Plan:  ID: continue vanocmycin and merapenem.  Culture pending.  Will tailor abx once cultures return.   GI: no issues Nutrition:  Regular diet IVF:  isotonic fluid at 75 mL/hr, may convert to saline lock DVT Prophylaxis:  SCDs Activity: as tolerated.  Some elevated blood pressures. Continue to monitor.   Anticipated Discharge: 1-2 days.  Thomasene Mohair, MD  05/04/2020 3:50 PM

## 2020-05-04 NOTE — Progress Notes (Signed)
Pt. With c/o itching at left wrist and several small flesh colored whelps noted. Benadryl given as per PRN order. 130/88, 94 and 20 with 100% O2 sat. Om R/A. Alert and oriented. BBS clear. Will notify M.D.

## 2020-05-04 NOTE — Progress Notes (Signed)
Lower abdominal incision that is open mid incision and draining sang. Fluid, after Dr. Jean Rosenthal probed site with sterile Q tip, cleansed with soap and water and new ABD Pad Applied. Pt. Tolerated well.

## 2020-05-04 NOTE — Progress Notes (Signed)
Dr. Jean Rosenthal notified of Pt. C/O itching and whelps noted on left wrist. Pt. Is alert and oriented with aprop. Affect. BBS clear. Denies any other C/O. Smiling and talking to Sig. Other. Dr. Jean Rosenthal stated he will be in to assess Pt.

## 2020-05-04 NOTE — Progress Notes (Signed)
Called to see patient by RN due to redness and welts at IV site after completion of Vancomycin. By the time I was at the bedside the RN had administered benadryl. The redness and swelling had already improved.  She does have some tenderness at the IV site. She denies shortness of breath and swelling anywhere else.  Wound evaluated. Minimal purulent material expressed.  Two openings in the skin explored by sterile q-tip.  Tracking for several centimeters, mainly sanguinous/bloody material expressed.  There is minimal erythema and the induration has decreased dramatically since I evaluated her in clinic with Dr. Jerene Pitch yesterday (Dr. Jerene Pitch asked me to evaluate the wound so that I would have a baseline of her wound prior to treatment).    Given the above, along with her CT scan results, I believe she could be transitioned to oral antibiotics.  I will switch her to [id number].    Thomasene Mohair, MD, [facility], [facility] 05/04/2020 8:11 PM

## 2020-05-04 NOTE — Progress Notes (Signed)
Mid C-section un- approximated incision line appx <1 cm in length weeping  Moderate serosang. Drainage. Area cleansed as per order and new dressing applied. Pt. Tolerated well.

## 2020-05-04 NOTE — Progress Notes (Signed)
Pt. States itching is resolving,whelps are diminishing;however there is sl. Erythema at IV sight and Pt. With c/o pain when site assessed. IV D/C'd. Dr. Jean Rosenthal states IV can stay out and Pt. Will be started on oral ABO's.

## 2020-05-05 LAB — AEROBIC CULTURE W GRAM STAIN (SUPERFICIAL SPECIMEN)

## 2020-05-05 LAB — CBC
HCT: 36.1 % (ref 36.0–46.0)
Hemoglobin: 11.6 g/dL — ABNORMAL LOW (ref 12.0–15.0)
MCH: 26.5 pg (ref 26.0–34.0)
MCHC: 32.1 g/dL (ref 30.0–36.0)
MCV: 82.4 fL (ref 80.0–100.0)
Platelets: 562 10*3/uL — ABNORMAL HIGH (ref 150–400)
RBC: 4.38 MIL/uL (ref 3.87–5.11)
RDW: 13.5 % (ref 11.5–15.5)
WBC: 10.1 10*3/uL (ref 4.0–10.5)
nRBC: 0 % (ref 0.0–0.2)

## 2020-05-05 MED ORDER — SULFAMETHOXAZOLE-TRIMETHOPRIM 800-160 MG PO TABS: 1.0000 | ORAL_TABLET | Freq: Two times a day (BID) | ORAL | 0 refills | Status: AC

## 2020-05-05 MED ORDER — ACETAMINOPHEN 500 MG PO TABS: 1000.0000 mg | ORAL_TABLET | Freq: Four times a day (QID) | ORAL | Status: DC | PRN

## 2020-05-05 MED ORDER — CEPHALEXIN 500 MG PO CAPS
500.0000 mg | ORAL_CAPSULE | ORAL | Status: AC
  Administered 2020-05-05: 500 mg via ORAL
  Filled 2020-05-05: qty 1

## 2020-05-05 MED ORDER — CEPHALEXIN 500 MG PO TABS: 500.0000 mg | ORAL_TABLET | Freq: Four times a day (QID) | ORAL | 0 refills | Status: AC

## 2020-05-05 NOTE — Discharge Summary (Signed)
DC Summary Discharge Summary   Patient ID: Crystal Hansen [id number] 29 y.o. 03-31-1991  Admit date: 05/03/2020  Discharge date: 05/05/2020  Principal Diagnoses: Cesarean wound infection (Superficial surgical site infection)  Secondary Diagnoses:  Status post cesarean section  Procedures performed during the hospitalization:  Treatment of wound infection with IV antibiotics  HPI: Reports that today she began having drainage from her incision. She denies fevers. She reports that for the last week the incision has been swollen firm and red at the midline. She had a cesarean delivery on 04/16/2020.  Her postpartum course has been thus far uncomplicated.   Past Medical History:  Diagnosis Date  . Allergy   . Anxiety   . Arthritis   . Headache     Past Surgical History:  Procedure Laterality Date  . APPENDECTOMY    . CESAREAN SECTION MULTI-GESTATIONAL  04/16/2020   Procedure: CESAREAN SECTION MULTI-GESTATIONAL;  Surgeon: Natale Milch, MD;  Location: [facility] ORS;  Service: Obstetrics;;    Allergies  Allergen Reactions  . Peanut Oil Anaphylaxis  . Shellfish Allergy Anaphylaxis  . Amoxapine And Related   . Apple Other (See Comments)  . Amoxicillin Rash    Social History   Tobacco Use  . Smoking status: Former Smoker    Packs/day: 0.50    Types: Cigarettes  . Smokeless tobacco: Never Used  Vaping Use  . Vaping Use: Never used  Substance Use Topics  . Alcohol use: Not Currently    Comment: on occasion  . Drug use: Never    Family History  Problem Relation Age of Onset  . Hernia Father   . Hypertension Father   . Lung cancer Maternal Grandmother   . Heart disease Maternal Grandfather   . Diabetes Maternal Grandfather   . Hypertension Maternal Grandfather   . Anemia Paternal Grandmother   . Prostate cancer Paternal [facility] Hospital Course:  She was admitted for wound infection. She was started on meropenem and vancomycin. See had a CT scan  that showed stranding without a significant collection of fluid or pus in the subcutaneous tissue. On HD#2 she had a mild reaction to what appeared to be her vancomycin dose.  Given her clinical improvement and findings on CT scan, she was converted to PO antibiotics of Bactrim DS.  A repeat CBC on HD#3 showed continued decrease in WBC.  Her wound continued have decrease induration and tenderness. She still had mild purulent and sanguinous expression from the wound. However, when probed no new purulent material could be expressed. She had a gram stain that showed gram positive diplococci.  She was discharged in improved condition with Bactrim DS and Keflex qid due to likely streptococcal infection.    Discharge Exam: BP 124/82 (BP Location: Right Arm)   Pulse (!) 103   Temp 97.6 F (36.4 C) (Oral)   Resp 18   Ht 5' 5.45" (1.662 m)   SpO2 98%   BMI 45.79 kg/m  Physical Exam Constitutional:      General: She is not in acute distress.    Appearance: Normal appearance. She is well-developed.  HENT:     Head: Normocephalic and atraumatic.  Eyes:     General: No scleral icterus.    Conjunctiva/sclera: Conjunctivae normal.  Cardiovascular:     Rate and Rhythm: Normal rate and regular rhythm.     Heart sounds: No murmur heard.  No friction rub. No gallop.   Pulmonary:     Effort: Pulmonary effort  is normal. No respiratory distress.     Breath sounds: Normal breath sounds. No wheezing or rales.  Abdominal:     General: Bowel sounds are normal. There is no distension.     Palpations: Abdomen is soft. There is no mass.     Tenderness: There is no abdominal tenderness. There is no guarding or rebound.    Musculoskeletal:        General: Normal range of motion.     Cervical back: Normal range of motion and neck supple.  Neurological:     General: No focal deficit present.     Mental Status: She is alert and oriented to person, place, and time.     Cranial Nerves: No cranial nerve deficit.    Skin:    General: Skin is warm and dry.     Findings: No erythema.  Psychiatric:        Mood and Affect: Mood normal.        Behavior: Behavior normal.        Judgment: Judgment normal.      Condition at Discharge: Stable  Complications affecting treatment: None  Discharge Medications:  Allergies as of 05/05/2020      Reactions   Peanut Oil Anaphylaxis   Shellfish Allergy Anaphylaxis   Amoxapine And Related    Apple Other (See Comments)   Amoxicillin Rash      Medication List    STOP taking these medications   oxyCODONE-acetaminophen 5-325 MG tablet Commonly known as: PERCOCET/ROXICET     TAKE these medications   acetaminophen 500 MG tablet Commonly known as: TYLENOL Take 2 tablets (1,000 mg total) by mouth every 6 (six) hours as needed for mild pain (or Fever >/= 101).   Cephalexin 500 MG tablet Take 1 tablet (500 mg total) by mouth 4 (four) times daily for 12 days.   Prenatal Vitamins 28-0.8 MG Tabs Take 1 each by mouth daily.   sulfamethoxazole-trimethoprim 800-160 MG tablet Commonly known as: BACTRIM DS Take 1 tablet by mouth 2 (two) times daily for 12 days.        Follow-up arrangements:   Follow-up Information    Schuman, Jaquelyn Bitter, MD. Schedule an appointment as soon as possible for a visit in 2 day(s).   Specialty: Obstetrics and Gynecology Why: MUST MAKE APPOINTMENT for 2 days for wound infection Contact information: [address] [place] [address] [phone]                Discharge Disposition: Discharge disposition: 01-Home or Self Care    Signed: Thomasene Mohair, MD  05/05/2020 12:52 PM

## 2020-05-05 NOTE — Discharge Instructions (Signed)
Wound Care, Adult Taking care of your wound properly can help to prevent pain, infection, and scarring. It can also help your wound to heal more quickly. How to care for your wound Wound care      Follow instructions from your health care provider about how to take care of your wound. Make sure you: ? Wash your hands with soap and water before you change the bandage (dressing). If soap and water are not available, use hand sanitizer. ? Change your dressing as told by your health care provider. ? Leave stitches (sutures), skin glue, or adhesive strips in place. These skin closures may need to stay in place for 2 weeks or longer. If adhesive strip edges start to loosen and curl up, you may trim the loose edges. Do not remove adhesive strips completely unless your health care provider tells you to do that.  Check your wound area every day for signs of infection. Check for: ? Redness, swelling, or pain. ? Fluid or blood. ? Warmth. ? Pus or a bad smell.  Ask your health care provider if you should clean the wound with mild soap and water. Doing this may include: ? Using a clean towel to pat the wound dry after cleaning it. Do not rub or scrub the wound. ? Applying a cream or ointment. Do this only as told by your health care provider. ? Covering the incision with a clean dressing.  Ask your health care provider when you can leave the wound uncovered.  Keep the dressing dry until your health care provider says it can be removed. Do not take baths, swim, use a hot tub, or do anything that would put the wound underwater until your health care provider approves. Ask your health care provider if you can take showers. You may only be allowed to take sponge baths. Medicines   If you were prescribed an antibiotic medicine, cream, or ointment, take or use the antibiotic as told by your health care provider. Do not stop taking or using the antibiotic even if your condition improves.  Take  over-the-counter and prescription medicines only as told by your health care provider. If you were prescribed pain medicine, take it 30 or more minutes before you do any wound care or as told by your health care provider. General instructions  Return to your normal activities as told by your health care provider. Ask your health care provider what activities are safe.  Do not scratch or pick at the wound.  Do not use any products that contain nicotine or tobacco, such as cigarettes and e-cigarettes. These may delay wound healing. If you need help quitting, ask your health care provider.  Keep all follow-up visits as told by your health care provider. This is important.  Eat a diet that includes protein, vitamin A, vitamin C, and other nutrient-rich foods to help the wound heal. ? Foods rich in protein include meat, dairy, beans, nuts, and other sources. ? Foods rich in vitamin A include carrots and dark green, leafy vegetables. ? Foods rich in vitamin C include citrus, tomatoes, and other fruits and vegetables. ? Nutrient-rich foods have protein, carbohydrates, fat, vitamins, or minerals. Eat a variety of healthy foods including vegetables, fruits, and whole grains. Contact a health care provider if:  You received a tetanus shot and you have swelling, severe pain, redness, or bleeding at the injection site.  Your pain is not controlled with medicine.  You have redness, swelling, or pain around the wound.    You have fluid or blood coming from the wound.  Your wound feels warm to the touch.  You have pus or a bad smell coming from the wound.  You have a fever or chills.  You are nauseous or you vomit.  You are dizzy. Get help right away if:  You have a red streak going away from your wound.  The edges of the wound open up and separate.  Your wound is bleeding, and the bleeding does not stop with gentle pressure.  You have a rash.  You faint.  You have trouble  breathing. Summary  Always wash your hands with soap and water before changing your bandage (dressing).  To help with healing, eat foods that are rich in protein, vitamin A, vitamin C, and other nutrients.  Check your wound every day for signs of infection. Contact your health care provider if you suspect that your wound is infected. This information is not intended to replace advice given to you by your health care provider. Make sure you discuss any questions you have with your health care provider. Document Revised: 12/12/2018 Document Reviewed: 03/10/2016 Elsevier Patient Education  2020 [employer] Wound Infection A wound infection happens when tiny organisms (microorganisms) start to grow in a wound. A wound infection is most often caused by bacteria. Infection can cause the wound to break open or worsen. Wound infection needs treatment. If a wound infection is left untreated, complications can occur. Untreated wound infections may lead to an infection in the bloodstream (septicemia) or a bone infection (osteomyelitis). What are the causes? This condition is most often caused by bacteria growing in a wound. Other microorganisms, like yeast and fungi, can also cause wound infections. What increases the risk? The following factors may make you more likely to develop this condition:  Having a weak body defense system (immune system).  Having diabetes.  Taking steroid medicines for a long time (chronic use).  Smoking.  Being an older person.  Being overweight.  Taking chemotherapy medicines. What are the signs or symptoms? Symptoms of this condition include:  Having more redness, swelling, or pain at the wound site.  Having more blood or fluid at the wound site.  A bad smell coming from a wound or bandage (dressing).  Having a fever.  Feeling tired or fatigued.  Having warmth at or around the wound.  Having pus at the wound site. How is this diagnosed? This  condition is diagnosed with a medical history and physical exam. You may also have a wound culture or blood tests or both. How is this treated? This condition is usually treated with an antibiotic medicine.  The infection should improve 24-48 hours after you start antibiotics.  After 24-48 hours, redness around the wound should stop spreading, and the wound should be less painful. Follow these instructions at home: Medicines  Take or apply over-the-counter and prescription medicines only as told by your health care provider.  If you were prescribed an antibiotic medicine, take or apply it as told by your health care provider. Do not stop using the antibiotic even if you start to feel better. Wound care   Clean the wound each day, or as told by your health care provider. ? Wash the wound with mild soap and water. ? Rinse the wound with water to remove all soap. ? Pat the wound dry with a clean towel. Do not rub it.  Follow instructions from your health care provider about how to take care of your  wound. Make sure you: ? Wash your hands with soap and water before and after you change your dressing. If soap and water are not available, use hand sanitizer. ? Change your dressing as told by your health care provider. ? Leave stitches (sutures), skin glue, or adhesive strips in place if your wound has been closed. These skin closures may need to stay in place for 2 weeks or longer. If adhesive strip edges start to loosen and curl up, you may trim the loose edges. Do not remove adhesive strips completely unless your health care provider tells you to do that. Some wounds are left open to heal on their own.  Check your wound every day for signs of infection. Watch for: ? More redness, swelling, or pain. ? More fluid or blood. ? Warmth. ? Pus or a bad smell. General instructions  Keep the dressing dry until your health care provider says it can be removed.  Do not take baths, swim, or use a  hot tub until your health care provider approves. Ask your health care provider if you may take showers. You may only be allowed to take sponge baths.  Raise (elevate) the injured area above the level of your heart while you are sitting or lying down.  Do not scratch or pick at the wound.  Keep all follow-up visits as told by your health care provider. This is important. Contact a health care provider if:  Your pain is not controlled with medicine.  You have more redness, swelling, or pain around your wound.  You have more fluid or blood coming from your wound.  Your wound feels warm to the touch.  You have pus coming from your wound.  You continue to notice a bad smell coming from your wound or your dressing.  Your wound that was closed breaks open. Get help right away if:  You have a red streak going away from your wound.  You have a fever. Summary  A wound infection happens when tiny organisms (microorganisms) start to grow in a wound.  This condition is usually treated with an antibiotic medicine.  Follow instructions from your health care provider about how to take care of your wound.  Contact a health care provider if your wound infection does not begin to improve in 24-48 hours, or your symptoms worsen.  Keep all follow-up visits as told by your health care provider. This is important. This information is not intended to replace advice given to you by your health care provider. Make sure you discuss any questions you have with your health care provider. Document Revised: 04/05/2018 Document Reviewed: 04/05/2018 Elsevier Patient Education  2020 [employer].

## 2020-05-05 NOTE — Progress Notes (Signed)
Pt discharged home.  Discharge instructions, prescriptions and follow up appointment given to and reviewed with pt. Wound care routine reviewed with Patient.  Pt verbalized understanding.  Patient ambulated out.

## 2020-05-07 ENCOUNTER — Encounter: Payer: Self-pay | Admitting: Obstetrics and Gynecology

## 2020-05-07 ENCOUNTER — Other Ambulatory Visit: Payer: Self-pay

## 2020-05-07 ENCOUNTER — Ambulatory Visit (INDEPENDENT_AMBULATORY_CARE_PROVIDER_SITE_OTHER): Payer: Medicaid Other | Admitting: Obstetrics and Gynecology

## 2020-05-07 VITALS — BP 128/84 | Ht 65.5 in | Wt 280.0 lb

## 2020-05-07 DIAGNOSIS — O8600 Infection of obstetric surgical wound, unspecified: Secondary | ICD-10-CM

## 2020-05-07 NOTE — Progress Notes (Signed)
   Postoperative Follow-up Patient presents post op from cesarean section  3 weeks ago.  Subjective: She denies fever, chills, nausea and vomiting. Eating a regular diet without difficulty. The patient is not having any pain.  Activity: normal activities of daily living. She denies worsening of symptoms with her incision. There is still some small amount of drainage, some sanguinous and some serous.  She was admitted over the weekend for superficial surgical site infection.  She was discharged after two days of IV antibiotics and rapid improvement. CT scan showed no collection of fluid, though she did leak small amount of purulent material.    Objective: BP 128/84   Ht 5' 5.5" (1.664 m)   Wt 280 lb (127 kg)   BMI 45.89 kg/m   Constitutional: Well nourished, well developed female in no acute distress.  HEENT: normal Skin: Warm and dry.  Abdomen: s/nt/nd/uterine fundus noted at U-2 Clean and dry. The defect documented at discharge appears similar without worsening surrounding erythema, induration, warmth, or tenderness. Still some mild induration cephalad to the middle of the incision.  The incision is probed again with a sterile q-tip and no loculations opened. A small amount of dark-red blood expressed.  The incision is now open in the midline for about 3-4 cm.   Extremity: no edema   Assessment: 28 y.o. s/p cesarean section with a superficial surgical site infection, continuing to heal well  Plan: Continue with abx.  Continue close monitoring of the healing process.  She seems to be doing well on PO abx as she has been on them for nearly three days now.   Return in about 2 days (around 05/09/2020) for incision check (MUST BE WORKED IN ON MD SCHEDULE).  Thomasene Mohair, MD 05/07/2020 10:07 AM

## 2020-05-09 LAB — WOUND CULTURE

## 2020-05-10 ENCOUNTER — Ambulatory Visit (INDEPENDENT_AMBULATORY_CARE_PROVIDER_SITE_OTHER): Payer: Medicaid Other | Admitting: Obstetrics and Gynecology

## 2020-05-10 ENCOUNTER — Other Ambulatory Visit: Payer: Self-pay

## 2020-05-10 ENCOUNTER — Encounter: Payer: Self-pay | Admitting: Obstetrics and Gynecology

## 2020-05-10 VITALS — BP 128/72 | HR 80 | Resp 18 | Ht 65.5 in | Wt 283.2 lb

## 2020-05-10 DIAGNOSIS — O8600 Infection of obstetric surgical wound, unspecified: Secondary | ICD-10-CM | POA: Diagnosis not present

## 2020-05-10 NOTE — Progress Notes (Signed)
Patient ID: Crystal Hansen, female   DOB: 1991-08-18, 29 y.o.   MRN: [id number]  Reason for Consult: Post-op Follow-up   Referred by No ref. provider found  Subjective:     HPI:  Crystal Hansen is a 29 y.o. female. She reports her incision is healing. It is getting smaller. She reports decreased drainage. She says in the hospital it opened to 5 cm.  She is taking antibiotics. She continues to clean the wound 2 times a day with soapy water.   Past Medical History:  Diagnosis Date  . Allergy   . Anxiety   . Arthritis   . Headache    Family History  Problem Relation Age of Onset  . Hernia Father   . Hypertension Father   . Lung cancer Maternal Grandmother   . Heart disease Maternal Grandfather   . Diabetes Maternal Grandfather   . Hypertension Maternal Grandfather   . Anemia Paternal Grandmother   . Prostate cancer Paternal Grandfather    Past Surgical History:  Procedure Laterality Date  . APPENDECTOMY    . CESAREAN SECTION MULTI-GESTATIONAL  04/16/2020   Procedure: CESAREAN SECTION MULTI-GESTATIONAL;  Surgeon: Natale Milch, MD;  Location: [facility] ORS;  Service: Obstetrics;;    Short Social History:  Social History   Tobacco Use  . Smoking status: Former Smoker    Packs/day: 0.50    Types: Cigarettes  . Smokeless tobacco: Never Used  Substance Use Topics  . Alcohol use: Not Currently    Comment: on occasion    Allergies  Allergen Reactions  . Peanut Oil Anaphylaxis  . Shellfish Allergy Anaphylaxis  . Amoxapine And Related   . Apple Other (See Comments)  . Amoxicillin Rash    Current Outpatient Medications  Medication Sig Dispense Refill  . acetaminophen (TYLENOL) 500 MG tablet Take 2 tablets (1,000 mg total) by mouth every 6 (six) hours as needed for mild pain (or Fever >/= 101).    . Cephalexin 500 MG tablet Take 1 tablet (500 mg total) by mouth 4 (four) times daily for 12 days. 48 tablet 0  . Prenatal Vit-Fe Fumarate-FA (PRENATAL  VITAMINS) 28-0.8 MG TABS Take 1 each by mouth daily. 30 tablet 11  . sulfamethoxazole-trimethoprim (BACTRIM DS) 800-160 MG tablet Take 1 tablet by mouth 2 (two) times daily for 12 days. 24 tablet 0   No current facility-administered medications for this visit.    Review of Systems  Constitutional: Negative for chills, fatigue, fever and unexpected weight change.  HENT: Negative for trouble swallowing.  Eyes: Negative for loss of vision.  Respiratory: Negative for cough, shortness of breath and wheezing.  Cardiovascular: Negative for chest pain, leg swelling, palpitations and syncope.  GI: Negative for abdominal pain, blood in stool, diarrhea, nausea and vomiting.  GU: Negative for difficulty urinating, dysuria, frequency and hematuria.  Musculoskeletal: Negative for back pain, leg pain and joint pain.  Skin: Negative for rash.  Neurological: Negative for dizziness, headaches, light-headedness, numbness and seizures.  Psychiatric: Negative for behavioral problem, confusion, depressed mood and sleep disturbance.        Objective:  Objective   Vitals:   05/10/20 1107  BP: 128/72  Pulse: 80  Resp: 18  SpO2: 98%  Weight: 283 lb 3.2 oz (128.5 kg)  Height: 5' 5.5" (1.664 m)   Body mass index is 46.41 kg/m.  Physical Exam Vitals and nursing note reviewed.  Constitutional:      Appearance: She is well-developed.  HENT:  Head: Normocephalic and atraumatic.  Eyes:     Pupils: Pupils are equal, round, and reactive to light.  Cardiovascular:     Rate and Rhythm: Normal rate and regular rhythm.  Pulmonary:     Effort: Pulmonary effort is normal. No respiratory distress.  Abdominal:     General: Abdomen is flat.     Palpations: Abdomen is soft.    Skin:    General: Skin is warm and dry.  Neurological:     Mental Status: She is alert and oriented to person, place, and time.  Psychiatric:        Behavior: Behavior normal.        Thought Content: Thought content normal.         Judgment: Judgment normal.        Assessment/Plan:     29 yo with cesarean wound infection E.Coli on culture, sensitive to current antibiotics.  Continue cleaning 3-4 times a day with soapy water.  Continue oral antibiotics.  Discussed wound clinic referral, patient declines at this time.  Follow up in 1 week, sooner if issues arise.   More than 10 minutes were spent face to face with the patient in the room, reviewing the medical record, labs and images, and coordinating care for the patient. The plan of management was discussed in detail and counseling was provided.      Adelene Idler MD [facility], [facility] 05/10/2020 11:32 AM

## 2020-05-16 ENCOUNTER — Ambulatory Visit
Admission: RE | Admit: 2020-05-16 | Discharge: 2020-05-16 | Disposition: A | Discharge status: Home / Self Care | Payer: Medicaid Other | Source: Ambulatory Visit | Attending: Obstetrics and Gynecology | Admitting: Obstetrics and Gynecology

## 2020-05-16 ENCOUNTER — Encounter: Payer: Self-pay | Admitting: Obstetrics and Gynecology

## 2020-05-16 ENCOUNTER — Ambulatory Visit (INDEPENDENT_AMBULATORY_CARE_PROVIDER_SITE_OTHER): Payer: Medicaid Other | Admitting: Obstetrics and Gynecology

## 2020-05-16 ENCOUNTER — Other Ambulatory Visit: Payer: Self-pay

## 2020-05-16 VITALS — BP 120/74 | HR 88 | Resp 18 | Ht 65.5 in | Wt 285.6 lb

## 2020-05-16 DIAGNOSIS — Z4889 Encounter for other specified surgical aftercare: Secondary | ICD-10-CM

## 2020-05-16 DIAGNOSIS — T148XXA Other injury of unspecified body region, initial encounter: Secondary | ICD-10-CM

## 2020-05-16 DIAGNOSIS — R600 Localized edema: Secondary | ICD-10-CM | POA: Diagnosis not present

## 2020-05-16 DIAGNOSIS — O8600 Infection of obstetric surgical wound, unspecified: Secondary | ICD-10-CM

## 2020-05-16 DIAGNOSIS — Z98891 History of uterine scar from previous surgery: Secondary | ICD-10-CM | POA: Diagnosis not present

## 2020-05-16 DIAGNOSIS — L089 Local infection of the skin and subcutaneous tissue, unspecified: Secondary | ICD-10-CM

## 2020-05-16 NOTE — Progress Notes (Signed)
Patient ID: Crystal Hansen, female   DOB: 1990/09/13, 29 y.o.   MRN: [id number]  Reason for Consult: Gynecologic Exam   Referred by No ref. provider found  Subjective:     HPI:  Crystal Hansen is a 29 y.o. female. She is feeling well. Reports bilateral lower leg edema. Left seems worse that right to her. She has been continuing to clean her wound infection and incision multiple times a day. She has two more days of antibiotics.    Past Medical History:  Diagnosis Date   Allergy    Anxiety    Arthritis    Headache    Family History  Problem Relation Age of Onset   Hernia Father    Hypertension Father    Lung cancer Maternal Grandmother    Heart disease Maternal Grandfather    Diabetes Maternal Grandfather    Hypertension Maternal Grandfather    Anemia Paternal Grandmother    Prostate cancer Paternal Grandfather    Past Surgical History:  Procedure Laterality Date   APPENDECTOMY     CESAREAN SECTION MULTI-GESTATIONAL  04/16/2020   Procedure: CESAREAN SECTION MULTI-GESTATIONAL;  Surgeon: Natale Milch, MD;  Location: [facility] ORS;  Service: Obstetrics;;    Short Social History:  Social History   Tobacco Use   Smoking status: Former Smoker    Packs/day: 0.50    Types: Cigarettes   Smokeless tobacco: Never Used  Substance Use Topics   Alcohol use: Not Currently    Comment: on occasion    Allergies  Allergen Reactions   Peanut Oil Anaphylaxis   Shellfish Allergy Anaphylaxis   Amoxapine And Related    Apple Other (See Comments)   Amoxicillin Rash    Current Outpatient Medications  Medication Sig Dispense Refill   acetaminophen (TYLENOL) 500 MG tablet Take 2 tablets (1,000 mg total) by mouth every 6 (six) hours as needed for mild pain (or Fever >/= 101).     Cephalexin 500 MG tablet Take 1 tablet (500 mg total) by mouth 4 (four) times daily for 12 days. 48 tablet 0   Prenatal Vit-Fe Fumarate-FA (PRENATAL VITAMINS)  28-0.8 MG TABS Take 1 each by mouth daily. 30 tablet 11   sulfamethoxazole-trimethoprim (BACTRIM DS) 800-160 MG tablet Take 1 tablet by mouth 2 (two) times daily for 12 days. 24 tablet 0   No current facility-administered medications for this visit.    Review of Systems  Constitutional: Negative for chills, fatigue, fever and unexpected weight change.  HENT: Negative for trouble swallowing.  Eyes: Negative for loss of vision.  Respiratory: Negative for cough, shortness of breath and wheezing.  Cardiovascular: Negative for chest pain, leg swelling, palpitations and syncope.  GI: Negative for abdominal pain, blood in stool, diarrhea, nausea and vomiting.  GU: Negative for difficulty urinating, dysuria, frequency and hematuria.  Musculoskeletal: Positive for leg pain. Negative for back pain and joint pain.       + swelling lower extremeties Skin: Negative for rash.  Neurological: Negative for dizziness, headaches, light-headedness, numbness and seizures.  Psychiatric: Negative for behavioral problem, confusion, depressed mood and sleep disturbance.        Objective:  Objective   Vitals:   05/16/20 1007  BP: 120/74  Pulse: 88  Resp: 18  SpO2: 98%  Weight: 285 lb 9.6 oz (129.5 kg)  Height: 5' 5.5" (1.664 m)   Body mass index is 46.8 kg/m.  Physical Exam Vitals and nursing note reviewed.  Constitutional:      Appearance:  She is well-developed.  HENT:     Head: Normocephalic and atraumatic.  Eyes:     Pupils: Pupils are equal, round, and reactive to light.  Cardiovascular:     Rate and Rhythm: Normal rate and regular rhythm.  Pulmonary:     Effort: Pulmonary effort is normal. No respiratory distress.  Abdominal:    Musculoskeletal:     Right lower leg: Edema present.     Left lower leg: Edema present.  Skin:    General: Skin is warm and dry.  Neurological:     Mental Status: She is alert and oriented to person, place, and time.  Psychiatric:        Behavior:  Behavior normal.        Thought Content: Thought content normal.        Judgment: Judgment normal.          Assessment/Plan:     29 yo s/p cesarean, wound healing well.  Incision is healing well Continue to wash 3-4 times a day Ordered Bilateral LE [place] for DVT- calfs equal 52 cm 16 cm below the knee, but patient complaining of swelling in bilateral extremeties.  Follow up in 1 week  More than 5 minutes were spent face to face with the patient in the room, reviewing the medical record, labs and images, and coordinating care for the patient. The plan of management was discussed in detail and counseling was provided.      Adelene Idler MD [facility], [facility] 05/16/2020 10:45 AM

## 2020-05-16 NOTE — Progress Notes (Signed)
Pt here for a incision check.

## 2020-05-21 ENCOUNTER — Encounter: Payer: Self-pay | Admitting: Obstetrics and Gynecology

## 2020-05-21 ENCOUNTER — Ambulatory Visit (INDEPENDENT_AMBULATORY_CARE_PROVIDER_SITE_OTHER): Payer: Medicaid Other | Admitting: Obstetrics and Gynecology

## 2020-05-21 ENCOUNTER — Other Ambulatory Visit: Payer: Self-pay

## 2020-05-21 VITALS — BP 124/72 | Ht 65.5 in | Wt 284.6 lb

## 2020-05-21 DIAGNOSIS — Z98891 History of uterine scar from previous surgery: Secondary | ICD-10-CM

## 2020-05-21 DIAGNOSIS — O8600 Infection of obstetric surgical wound, unspecified: Secondary | ICD-10-CM

## 2020-05-21 DIAGNOSIS — Z4889 Encounter for other specified surgical aftercare: Secondary | ICD-10-CM

## 2020-05-21 DIAGNOSIS — L089 Local infection of the skin and subcutaneous tissue, unspecified: Secondary | ICD-10-CM

## 2020-05-21 NOTE — Telephone Encounter (Signed)
Patient is scheduled for 05/31/20 with [facility] for nexplanon placement

## 2020-05-21 NOTE — Progress Notes (Signed)
   Patient ID: Crystal Hansen, female   DOB: 01/28/1991, 29 y.o.   MRN: [id number]  Reason for Consult: Gynecologic Exam (wound check )   Referred by No ref. provider found  Subjective:     HPI:  Crystal Hansen is a 29 y.o. female - following for wound infection, reports that the incision has continued to heal.    Past Medical History:  Diagnosis Date  . Allergy   . Anxiety   . Arthritis   . Headache    Family History  Problem Relation Age of Onset  . Hernia Father   . Hypertension Father   . Lung cancer Maternal Grandmother   . Heart disease Maternal Grandfather   . Diabetes Maternal Grandfather   . Hypertension Maternal Grandfather   . Anemia Paternal Grandmother   . Prostate cancer Paternal Grandfather    Past Surgical History:  Procedure Laterality Date  . APPENDECTOMY    . CESAREAN SECTION MULTI-GESTATIONAL  04/16/2020   Procedure: CESAREAN SECTION MULTI-GESTATIONAL;  Surgeon: Natale Milch, MD;  Location: [facility] ORS;  Service: Obstetrics;;    Short Social History:  Social History   Tobacco Use  . Smoking status: Former Smoker    Packs/day: 0.50    Types: Cigarettes  . Smokeless tobacco: Never Used  Substance Use Topics  . Alcohol use: Not Currently    Comment: on occasion    Allergies  Allergen Reactions  . Peanut Oil Anaphylaxis  . Shellfish Allergy Anaphylaxis  . Amoxapine And Related   . Apple Other (See Comments)  . Amoxicillin Rash    Current Outpatient Medications  Medication Sig Dispense Refill  . acetaminophen (TYLENOL) 500 MG tablet Take 2 tablets (1,000 mg total) by mouth every 6 (six) hours as needed for mild pain (or Fever >/= 101).    . Prenatal Vit-Fe Fumarate-FA (PRENATAL VITAMINS) 28-0.8 MG TABS Take 1 each by mouth daily. 30 tablet 11   No current facility-administered medications for this visit.    REVIEW OF SYSTEMS      Objective:  Objective   Vitals:   05/21/20 1411  BP: 124/72  Weight: 284 lb 9.6  oz (129.1 kg)  Height: 5' 5.5" (1.664 m)   Body mass index is 46.64 kg/m.  Physical Exam Abdominal:           Assessment/Plan:     29 yo with cesarean wound infection   Incision healing well, 1cm superficial opening. No purulent drainage. Continue care with regular washing and application of neosporin.   Follow up 1 week   Desires Nexplanon with next visit.   Adelene Idler MD, [facility], [facility] 05/21/2020 2:54 PM

## 2020-05-22 NOTE — Telephone Encounter (Signed)
This encounter was created in error - please disregard.

## 2020-05-31 ENCOUNTER — Other Ambulatory Visit: Payer: Self-pay

## 2020-05-31 ENCOUNTER — Ambulatory Visit (INDEPENDENT_AMBULATORY_CARE_PROVIDER_SITE_OTHER): Payer: Medicaid Other | Admitting: Obstetrics and Gynecology

## 2020-05-31 ENCOUNTER — Encounter: Payer: Self-pay | Admitting: Obstetrics and Gynecology

## 2020-05-31 VITALS — BP 130/80 | Ht 65.5 in | Wt 282.0 lb

## 2020-05-31 DIAGNOSIS — Z30017 Encounter for initial prescription of implantable subdermal contraceptive: Secondary | ICD-10-CM | POA: Diagnosis not present

## 2020-05-31 DIAGNOSIS — Z98891 History of uterine scar from previous surgery: Secondary | ICD-10-CM

## 2020-05-31 DIAGNOSIS — T148XXA Other injury of unspecified body region, initial encounter: Secondary | ICD-10-CM

## 2020-05-31 DIAGNOSIS — O8600 Infection of obstetric surgical wound, unspecified: Secondary | ICD-10-CM

## 2020-05-31 DIAGNOSIS — F320 Major depressive disorder, single episode, mild: Secondary | ICD-10-CM

## 2020-05-31 LAB — POCT URINE PREGNANCY: Preg Test, Ur: NEGATIVE

## 2020-05-31 MED ORDER — CITALOPRAM HYDROBROMIDE 20 MG PO TABS: 20.0000 mg | ORAL_TABLET | Freq: Every day | ORAL | 1 refills | Status: DC

## 2020-05-31 NOTE — Patient Instructions (Addendum)
Etonogestrel implant What is this medicine? ETONOGESTREL (et oh noe JES trel) is a contraceptive (birth control) device. It is used to prevent pregnancy. It can be used for up to 3 years. This medicine may be used for other purposes; ask your health care provider or pharmacist if you have questions. COMMON BRAND NAME(S): Implanon, Nexplanon What should I tell my health care provider before I take this medicine? They need to know if you have any of these conditions:  abnormal vaginal bleeding  blood vessel disease or blood clots  breast, cervical, endometrial, ovarian, liver, or uterine cancer  diabetes  gallbladder disease  heart disease or recent heart attack  high blood pressure  high cholesterol or triglycerides  kidney disease  liver disease  migraine headaches  seizures  stroke  tobacco smoker  an unusual or allergic reaction to etonogestrel, anesthetics or antiseptics, other medicines, foods, dyes, or preservatives  pregnant or trying to get pregnant  breast-feeding How should I use this medicine? This device is inserted just under the skin on the inner side of your upper arm by a health care professional. Talk to your pediatrician regarding the use of this medicine in children. Special care may be needed. Overdosage: If you think you have taken too much of this medicine contact a poison control center or emergency room at once. NOTE: This medicine is only for you. Do not share this medicine with others. What if I miss a dose? This does not apply. What may interact with this medicine? Do not take this medicine with any of the following medications:  amprenavir  fosamprenavir This medicine may also interact with the following medications:  acitretin  aprepitant  armodafinil  bexarotene  bosentan  carbamazepine  certain medicines for fungal infections like fluconazole, ketoconazole, itraconazole and voriconazole  certain medicines to treat  hepatitis, HIV or AIDS  cyclosporine  felbamate  griseofulvin  lamotrigine  modafinil  oxcarbazepine  phenobarbital  phenytoin  primidone  rifabutin  rifampin  rifapentine  St. John's wort  topiramate This list may not describe all possible interactions. Give your health care provider a list of all the medicines, herbs, non-prescription drugs, or dietary supplements you use. Also tell them if you smoke, drink alcohol, or use illegal drugs. Some items may interact with your medicine. What should I watch for while using this medicine? This product does not protect you against HIV infection (AIDS) or other sexually transmitted diseases. You should be able to feel the implant by pressing your fingertips over the skin where it was inserted. Contact your doctor if you cannot feel the implant, and use a non-hormonal birth control method (such as condoms) until your doctor confirms that the implant is in place. Contact your doctor if you think that the implant may have broken or become bent while in your arm. You will receive a user card from your health care provider after the implant is inserted. The card is a record of the location of the implant in your upper arm and when it should be removed. Keep this card with your health records. What side effects may I notice from receiving this medicine? Side effects that you should report to your doctor or health care professional as soon as possible:  allergic reactions like skin rash, itching or hives, swelling of the face, lips, or tongue  breast lumps, breast tissue changes, or discharge  breathing problems  changes in emotions or moods  coughing up blood  if you feel that the implant   may have broken or bent while in your arm  high blood pressure  pain, irritation, swelling, or bruising at the insertion site  scar at site of insertion  signs of infection at the insertion site such as fever, and skin redness, pain or  discharge  signs and symptoms of a blood clot such as breathing problems; changes in vision; chest pain; severe, sudden headache; pain, swelling, warmth in the leg; trouble speaking; sudden numbness or weakness of the face, arm or leg  signs and symptoms of liver injury like dark yellow or brown urine; general ill feeling or flu-like symptoms; light-colored stools; loss of appetite; nausea; right upper belly pain; unusually weak or tired; yellowing of the eyes or skin  unusual vaginal bleeding, discharge Side effects that usually do not require medical attention (report to your doctor or health care professional if they continue or are bothersome):  acne  breast pain or tenderness  headache  irregular menstrual bleeding  nausea This list may not describe all possible side effects. Call your doctor for medical advice about side effects. You may report side effects to FDA at 1-800-FDA-1088. Where should I keep my medicine? This drug is given in a hospital or clinic and will not be stored at home. NOTE: This sheet is a summary. It may not cover all possible information. If you have questions about this medicine, talk to your doctor, pharmacist, or health care provider.  2020 Elsevier/Gold Standard (2019-06-06 11:33:04)  Living With Depression Everyone experiences occasional disappointment, sadness, and loss in their lives. When you are feeling down, blue, or sad for at least 2 weeks in a row, it may mean that you have depression. Depression can affect your thoughts and feelings, relationships, daily activities, and physical health. It is caused by changes in the way your brain functions. If you receive a diagnosis of depression, your health care provider will tell you which type of depression you have and what treatment options are available to you. If you are living with depression, there are ways to help you recover from it and also ways to prevent it from coming back. How to cope with  lifestyle changes Coping with stress     Stress is your body's reaction to life changes and events, both good and bad. Stressful situations may include:  Getting married.  The death of a spouse.  Losing a job.  Retiring.  Having a baby. Stress can last just a few hours or it can be ongoing. Stress can play a major role in depression, so it is important to learn both how to cope with stress and how to think about it differently. Talk with your health care provider or a counselor if you would like to learn more about stress reduction. He or she may suggest some stress reduction techniques, such as:  Music therapy. This can include creating music or listening to music. Choose music that you enjoy and that inspires you.  Mindfulness-based meditation. This kind of meditation can be done while sitting or walking. It involves being aware of your normal breaths, rather than trying to control your breathing.  Centering prayer. This is a kind of meditation that involves focusing on a spiritual word or phrase. Choose a word, phrase, or sacred image that is meaningful to you and that brings you peace.  Deep breathing. To do this, expand your stomach and inhale slowly through your nose. Hold your breath for 3-5 seconds, then exhale slowly, allowing your stomach muscles to relax.  Muscle relaxation. This involves intentionally tensing muscles then relaxing them. Choose a stress reduction technique that fits your lifestyle and personality. Stress reduction techniques take time and practice to develop. Set aside 5-15 minutes a day to do them. Therapists can offer training in these techniques. The training may be covered by some insurance plans. Other things you can do to manage stress include:  Keeping a stress diary. This can help you learn what triggers your stress and ways to control your response.  Understanding what your limits are and saying no to requests or events that lead to a schedule that  is too full.  Thinking about how you respond to certain situations. You may not be able to control everything, but you can control how you react.  Adding humor to your life by watching funny films or TV shows.  Making time for activities that help you relax and not feeling guilty about spending your time this way.  Medicines Your health care provider may suggest certain medicines if he or she feels that they will help improve your condition. Avoid using alcohol and other substances that may prevent your medicines from working properly (may interact). It is also important to:  Talk with your pharmacist or health care provider about all the medicines that you take, their possible side effects, and what medicines are safe to take together.  Make it your goal to take part in all treatment decisions (shared decision-making). This includes giving input on the side effects of medicines. It is best if shared decision-making with your health care provider is part of your total treatment plan. If your health care provider prescribes a medicine, you may not notice the full benefits of it for 4-8 weeks. Most people who are treated for depression need to be on medicine for at least 6-12 months after they feel better. If you are taking medicines as part of your treatment, do not stop taking medicines without first talking to your health care provider. You may need to have the medicine slowly decreased (tapered) over time to decrease the risk of harmful side effects. Relationships Your health care provider may suggest family therapy along with individual therapy and drug therapy. While there may not be family problems that are causing you to feel depressed, it is still important to make sure your family learns as much as they can about your mental health. Having your family's support can help make your treatment successful. How to recognize changes in your condition Everyone has a different response to treatment  for depression. Recovery from major depression happens when you have not had signs of major depression for two months. This may mean that you will start to:  Have more interest in doing activities.  Feel less hopeless than you did 2 months ago.  Have more energy.  Overeat less often, or have better or improving appetite.  Have better concentration. Your health care provider will work with you to decide the next steps in your recovery. It is also important to recognize when your condition is getting worse. Watch for these signs:  Having fatigue or low energy.  Eating too much or too little.  Sleeping too much or too little.  Feeling restless, agitated, or hopeless.  Having trouble concentrating or making decisions.  Having unexplained physical complaints.  Feeling irritable, angry, or aggressive. Get help as soon as you or your family members notice these symptoms coming back. How to get support and help from others How to talk with friends  and family members about your condition  Talking to friends and family members about your condition can provide you with one way to get support and guidance. Reach out to trusted friends or family members, explain your symptoms to them, and let them know that you are working with a health care provider to treat your depression. Financial resources Not all insurance plans cover mental health care, so it is important to check with your insurance carrier. If paying for co-pays or counseling services is a problem, search for a local or county mental health care center. They may be able to offer public mental health care services at low or no cost when you are not able to see a private health care provider. If you are taking medicine for depression, you may be able to get the generic form, which may be less expensive. Some makers of prescription medicines also offer help to patients who cannot afford the medicines they need. Follow these instructions  at home:   Get the right amount and quality of sleep.  Cut down on using caffeine, tobacco, alcohol, and other potentially harmful substances.  Try to exercise, such as walking or lifting small weights.  Take over-the-counter and prescription medicines only as told by your health care provider.  Eat a healthy diet that includes plenty of vegetables, fruits, whole grains, low-fat dairy products, and lean protein. Do not eat a lot of foods that are high in solid fats, added sugars, or salt.  Keep all follow-up visits as told by your health care provider. This is important. Contact a health care provider if:  You stop taking your antidepressant medicines, and you have any of these symptoms: ? Nausea. ? Headache. ? Feeling lightheaded. ? Chills and body aches. ? Not being able to sleep (insomnia).  You or your friends and family think your depression is getting worse. Get help right away if:  You have thoughts of hurting yourself or others. If you ever feel like you may hurt yourself or others, or have thoughts about taking your own life, get help right away. You can go to your nearest emergency department or call:  Your local emergency services (911 in the [place]  A suicide crisis helpline, such as the National Suicide Prevention Lifeline at 910-425-6367. This is open 24-hours a day. Summary  If you are living with depression, there are ways to help you recover from it and also ways to prevent it from coming back.  Work with your health care team to create a management plan that includes counseling, stress management techniques, and healthy lifestyle habits. This information is not intended to replace advice given to you by your health care provider. Make sure you discuss any questions you have with your health care provider. Document Revised: 12/16/2018 Document Reviewed: 07/27/2016 Elsevier Patient Education  2020 Elsevier Inc.   Managing Anxiety, Adult After being  diagnosed with an anxiety disorder, you may be relieved to know why you have felt or behaved a certain way. You may also feel overwhelmed about the treatment ahead and what it will mean for your life. With care and support, you can manage this condition and recover from it. How to manage lifestyle changes Managing stress and anxiety  Stress is your body's reaction to life changes and events, both good and bad. Most stress will last just a few hours, but stress can be ongoing and can lead to more than just stress. Although stress can play a major role in anxiety, it is  not the same as anxiety. Stress is usually caused by something external, such as a deadline, test, or competition. Stress normally passes after the triggering event has ended.  Anxiety is caused by something internal, such as imagining a terrible outcome or worrying that something will go wrong that will devastate you. Anxiety often does not go away even after the triggering event is over, and it can become long-term (chronic) worry. It is important to understand the differences between stress and anxiety and to manage your stress effectively so that it does not lead to an anxious response. Talk with your health care provider or a counselor to learn more about reducing anxiety and stress. He or she may suggest tension reduction techniques, such as:  Music therapy. This can include creating or listening to music that you enjoy and that inspires you.  Mindfulness-based meditation. This involves being aware of your normal breaths while not trying to control your breathing. It can be done while sitting or walking.  Centering prayer. This involves focusing on a word, phrase, or sacred image that means something to you and brings you peace.  Deep breathing. To do this, expand your stomach and inhale slowly through your nose. Hold your breath for 3-5 seconds. Then exhale slowly, letting your stomach muscles relax.  Self-talk. This involves  identifying thought patterns that lead to anxiety reactions and changing those patterns.  Muscle relaxation. This involves tensing muscles and then relaxing them. Choose a tension reduction technique that suits your lifestyle and personality. These techniques take time and practice. Set aside 5-15 minutes a day to do them. Therapists can offer counseling and training in these techniques. The training to help with anxiety may be covered by some insurance plans. Other things you can do to manage stress and anxiety include:  Keeping a stress/anxiety diary. This can help you learn what triggers your reaction and then learn ways to manage your response.  Thinking about how you react to certain situations. You may not be able to control everything, but you can control your response.  Making time for activities that help you relax and not feeling guilty about spending your time in this way.  Visual imagery and yoga can help you stay calm and relax.  Medicines Medicines can help ease symptoms. Medicines for anxiety include:  Anti-anxiety drugs.  Antidepressants. Medicines are often used as a primary treatment for anxiety disorder. Medicines will be prescribed by a health care provider. When used together, medicines, psychotherapy, and tension reduction techniques may be the most effective treatment. Relationships Relationships can play a big part in helping you recover. Try to spend more time connecting with trusted friends and family members. Consider going to couples counseling, taking family education classes, or going to family therapy. Therapy can help you and others better understand your condition. How to recognize changes in your anxiety Everyone responds differently to treatment for anxiety. Recovery from anxiety happens when symptoms decrease and stop interfering with your daily activities at home or work. This may mean that you will start to:  Have better concentration and focus. Worry  will interfere less in your daily thinking.  Sleep better.  Be less irritable.  Have more energy.  Have improved memory. It is important to recognize when your condition is getting worse. Contact your health care provider if your symptoms interfere with home or work and you feel like your condition is not improving. Follow these instructions at home: Activity  Exercise. Most adults should do the following: ?  Exercise for at least 150 minutes each week. The exercise should increase your heart rate and make you sweat (moderate-intensity exercise). ? Strengthening exercises at least twice a week.  Get the right amount and quality of sleep. Most adults need 7-9 hours of sleep each night. Lifestyle   Eat a healthy diet that includes plenty of vegetables, fruits, whole grains, low-fat dairy products, and lean protein. Do not eat a lot of foods that are high in solid fats, added sugars, or salt.  Make choices that simplify your life.  Do not use any products that contain nicotine or tobacco, such as cigarettes, e-cigarettes, and chewing tobacco. If you need help quitting, ask your health care provider.  Avoid caffeine, alcohol, and certain over-the-counter cold medicines. These may make you feel worse. Ask your pharmacist which medicines to avoid. General instructions  Take over-the-counter and prescription medicines only as told by your health care provider.  Keep all follow-up visits as told by your health care provider. This is important. Where to find support You can get help and support from these sources:  Self-help groups.  Online and [employer].  A trusted spiritual leader.  Couples counseling.  Family education classes.  Family therapy. Where to find more information You may find that joining a support group helps you deal with your anxiety. The following sources can help you locate counselors or support groups near you:  Mental Health [place]:  www.mentalhealthamerica.net  Anxiety and Depression Association of [place] (ADAA): ProgramCam.de  [employer] Mental [employer]ness (NAMI): www.nami.org Contact a health care provider if you:  Have a hard time staying focused or finishing daily tasks.  Spend many hours a day feeling worried about everyday life.  Become exhausted by worry.  Start to have headaches, feel tense, or have nausea.  Urinate more than normal.  Have diarrhea. Get help right away if you have:  A racing heart and shortness of breath.  Thoughts of hurting yourself or others. If you ever feel like you may hurt yourself or others, or have thoughts about taking your own life, get help right away. You can go to your nearest emergency department or call:  Your local emergency services (911 in the [place]  A suicide crisis helpline, such as the National Suicide Prevention Lifeline at (204)326-2989. This is open 24 hours a day. Summary  Taking steps to learn and use tension reduction techniques can help calm you and help prevent triggering an anxiety reaction.  When used together, medicines, psychotherapy, and tension reduction techniques may be the most effective treatment.  Family, friends, and partners can play a big part in helping you recover from an anxiety disorder. This information is not intended to replace advice given to you by your health care provider. Make sure you discuss any questions you have with your health care provider. Document Revised: 01/24/2019 Document Reviewed: 01/24/2019 Elsevier Patient Education  2020 Elsevier Inc.   Perinatal Depression When a woman feels excessive sadness, anger, or anxiety during pregnancy or during the first 12 months after she gives birth, she has a condition called perinatal depression. Depression can interfere with work, school, relationships, and other everyday activities. If it is not managed properly, it can also cause problems in the mother and her  baby. Sometimes, perinatal depression is left untreated because symptoms are thought to be normal mood swings during and right after pregnancy. If you have symptoms of depression, it is important to talk with your health care provider. What are the causes?  The exact cause of this condition is not known. Hormonal changes during and after pregnancy may play a role in causing perinatal depression. What increases the risk? You are more likely to develop this condition if:  You have a personal or family history of depression, anxiety, or mood disorders.  You experience a stressful life event during pregnancy, such as the death of a loved one.  You have a lot of regular life stress.  You do not have support from family members or loved ones, or you are in an abusive relationship. What are the signs or symptoms? Symptoms of this condition include:  Feeling sad or hopeless.  Feelings of guilt.  Feeling irritable or overwhelmed.  Changes in your appetite.  Lack of energy or motivation.  Sleep problems.  Difficulty concentrating or completing tasks.  Loss of interest in hobbies or relationships.  Headaches or stomach problems that do not go away. How is this diagnosed? This condition is diagnosed based on a physical exam and mental evaluation. In some cases, your health care provider may use a depression screening tool. These tools include a list of questions that can help a health care provider diagnose depression. Your health care provider may refer you to a mental health expert who specializes in depression. How is this treated? This condition may be treated with:  Medicines. Your health care provider will only give you medicines that have been proven safe for pregnancy and breastfeeding.  Talk therapy with a mental health professional to help change your patterns of thinking (cognitive behavioral therapy).  Support groups.  Brain stimulation or light therapies.  Stress  reduction therapies, such as mindfulness. Follow these instructions at home: Lifestyle  Do not use any products that contain nicotine or tobacco, such as cigarettes and e-cigarettes. If you need help quitting, ask your health care provider.  Do not use alcohol when you are pregnant. After your baby is born, limit alcohol intake to no more than 1 drink a day. One drink equals 12 oz of beer, 5 oz of wine, or 1 oz of hard liquor.  Consider joining a support group for new mothers. Ask your health care provider for recommendations.  Take good care of yourself. Make sure you: ? Get plenty of sleep. If you are having trouble sleeping, talk with your health care provider. ? Eat a healthy diet. This includes plenty of fruits and vegetables, whole grains, and lean proteins. ? Exercise regularly, as told by your health care provider. Ask your health care provider what exercises are safe for you. General instructions  Take over-the-counter and prescription medicines only as told by your health care provider.  Talk with your partner or family members about your feelings during pregnancy. Share any concerns or anxieties that you may have.  Ask for help with tasks or chores when you need it. Ask friends and family members to provide meals, watch your children, or help with cleaning.  Keep all follow-up visits as told by your health care provider. This is important. Contact a health care provider if:  You (or people close to you) notice that you have any symptoms of depression.  You have depression and your symptoms get worse.  You experience side effects from medicines, such as nausea or sleep problems. Get help right away if:  You feel like hurting yourself, your baby, or someone else. If you ever feel like you may hurt yourself or others, or have thoughts about taking your own life, get help right away.  You can go to your nearest emergency department or call:  Your local emergency services  (911 in the [place]  A suicide crisis helpline, such as the National Suicide Prevention Lifeline at 478-869-5038. This is open 24 hours a day. Summary  Perinatal depression is when a woman feels excessive sadness, anger, or anxiety during pregnancy or during the first 12 months after she gives birth.  If perinatal depression is not treated, it can lead to health problems for the mother and her baby.  This condition is treated with medicines, talk therapy, stress reduction therapies, or a combination of two or more treatments.  Talk with your partner or family members about your feelings. Do not be afraid to ask for help. This information is not intended to replace advice given to you by your health care provider. Make sure you discuss any questions you have with your health care provider. Document Revised: 02/08/2019 Document Reviewed: 10/21/2016 Elsevier Patient Education  2020 [employer].

## 2020-05-31 NOTE — Progress Notes (Addendum)
  OBSTETRICS POSTPARTUM CLINIC PROGRESS NOTE  Subjective:     Crystal Hansen is a 29 y.o. G72P2003 female who presents for a postpartum visit. She is 6 weeks postpartum following a Term pregnancy and delivery by C-section.  I have fully reviewed the prenatal and intrapartum course. Anesthesia: epidural.  Postpartum course has been complicated by complicated by wound infection.  Baby is feeding by Bottle.  Bleeding: patient has  resumed menses.  Bowel function is normal. Bladder function is normal.  Patient is not sexually active. Contraception method desired is Nexplanon.  Postpartum depression screening: negative. Edinburgh 9.  The following portions of the patient's history were reviewed and updated as appropriate: allergies, current medications, past family history, past medical history, past social history, past surgical history and problem list.   Patient reports that her left outer thigh is numb and this is continued since the time of delivery.  Concern for nerve damage.  Will refer to neurology.  Review of Systems Pertinent items are noted in HPI.  Objective:    BP 130/80   Ht 5' 5.5" (1.664 m)   Wt 282 lb (127.9 kg)   Breastfeeding No   BMI 46.21 kg/m   General:  alert and no distress   Breasts:  inspection negative, no nipple discharge or bleeding, no masses or nodularity palpable  Lungs: clear to auscultation bilaterally  Heart:  regular rate and rhythm, S1, S2 normal, no murmur, click, rub or gallop  Abdomen: soft, non-tender; bowel sounds normal; no masses,  no organomegaly.  Well healed Pfannenstiel incision, small pinpoint place in midline which is still healing.                 Rectal Exam: Not performed.        Nexplanon Insertion Procedure Note Insertion:The bicipital grove was palpated and site 8-10cm proximal to the medial epicondyle and 3-5 cm below the grove was identified.  Nexplanon removed from sterile blister packaging,  Device confirmed in  needle, before inserting full length of needle, tenting up the skin as the needle was advance.  The drug eluting rod was then deployed by pulling back the slider per the manufactures recommendation.  The implant was palpable by the clinician as well as the patient.  The insertion site covered dressed with a band aid before applying  a kerlex bandage pressure dressing. Minimal blood loss was noted during the procedure.  The patient tolerated the procedure well.    Assessment:  Post Partum Care visit 1. Cesarean wound infection Resolving  2. S/P cesarean section   3. Postpartum care following cesarean delivery   4. Nexplanon insertion Placed successfully - POCT urine pregnancy  5. Postpartum depression,  Patient would like to start antidepressant.  Will follow up in 4 weeks.   6. Outer thigh, left leg numbness  Patient reports that her left outer thigh is numb and this is continued since the time of delivery.  Concern for nerve damage.  Will refer to neurology.  Plan:  See orders and Patient Instructions Follow up in: 1 month or as needed.    Adelene Idler MD, [facility], [facility] 05/31/2020 1:48 PM

## 2020-06-04 ENCOUNTER — Other Ambulatory Visit: Payer: Self-pay | Admitting: Obstetrics and Gynecology

## 2020-06-04 DIAGNOSIS — T148XXA Other injury of unspecified body region, initial encounter: Secondary | ICD-10-CM

## 2020-06-17 ENCOUNTER — Encounter: Payer: Self-pay | Admitting: Neurology

## 2020-09-13 ENCOUNTER — Other Ambulatory Visit: Payer: Self-pay

## 2020-09-13 ENCOUNTER — Ambulatory Visit: Payer: Medicaid Other | Admitting: Family Medicine

## 2020-09-13 ENCOUNTER — Encounter: Payer: Self-pay | Admitting: Neurology

## 2020-09-13 ENCOUNTER — Encounter: Payer: Self-pay | Admitting: Family Medicine

## 2020-09-13 ENCOUNTER — Ambulatory Visit: Payer: Medicaid Other | Admitting: Neurology

## 2020-09-13 VITALS — BP 133/79 | HR 77 | Ht 65.0 in | Wt 273.0 lb

## 2020-09-13 VITALS — BP 119/79 | HR 85 | Temp 98.7°F | Ht 65.5 in | Wt 273.0 lb

## 2020-09-13 DIAGNOSIS — G5712 Meralgia paresthetica, left lower limb: Secondary | ICD-10-CM

## 2020-09-13 DIAGNOSIS — G8929 Other chronic pain: Secondary | ICD-10-CM | POA: Diagnosis not present

## 2020-09-13 DIAGNOSIS — Z2821 Immunization not carried out because of patient refusal: Secondary | ICD-10-CM | POA: Insufficient documentation

## 2020-09-13 DIAGNOSIS — M545 Low back pain, unspecified: Secondary | ICD-10-CM

## 2020-09-13 MED ORDER — MELOXICAM 15 MG PO TABS: 15.0000 mg | ORAL_TABLET | Freq: Every day | ORAL | 1 refills | Status: DC

## 2020-09-13 NOTE — Assessment & Plan Note (Signed)
Patient is most concerned about her back pain today She is worried because she found out that her uncle has degenerative disc disease-we discussed that this is not hereditary in nature Reassurance given that her leg numbness is not related to her back pain and she does not have any radicular signs or symptoms on exam There is no indication for imaging at this time as she does not have any bony tenderness or red flag signs Discussed lumbar strain and the many things that can cause it Discussed that weight management can improve back pain as it does put strain on her back Patient agreeable to PT referral and this was placed today Home exercise program given to start stretching prior to initiating PT Encourage heat application as needed for symptom management Consider muscle relaxers Patient agreeable to taking meloxicam daily as needed for symptom management Avoid other NSAIDs while taking this Discussed return precautions

## 2020-09-13 NOTE — Patient Instructions (Signed)

## 2020-09-13 NOTE — Assessment & Plan Note (Signed)
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

## 2020-09-13 NOTE — Progress Notes (Signed)
[facility] Clinic Note - Initial Visit   Date: 09/13/20  Crystal Hansen MRN: [id number] DOB: 25-Mar-1991   Dear Dr. Gilman Schmidt:  Thank you for your kind referral of Crystal Hansen for consultation of left thigh pain. Although her history is well known to you, please allow Crystal Hansen to reiterate it for the purpose of our medical record. The patient was accompanied to the clinic by boyfriend who also provides collateral information.     History of Present Illness: Crystal Hansen is a 30 y.o. right-handed female with post-partum depression presenting for evaluation of left thigh pain.   She delievered twin on 04/16/2020 and about a week later, she noticed numbness and tingling over the left lateral thigh.  She has noticed any thing to improve her symptoms.  Symptoms are constant.  Pain does not involve her medial thigh or lower leg.  She has tried a topical numbing medication without relief.  She gained 30-40lb with her pregnancy and has not lost any significant weight. She denies wearing tight constrictive clothing around her waist.   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  No diabetes, history of alcohol abuse, or family history of neuropathy.   She also complains of chronic low back pain, worsening since her delivery.  Pain is over the left lower lumbar region, achy, worse with activity, such as walking and bending. Prior imaging has been negative.    Past Medical History:  Diagnosis Date  . Allergy   . Anxiety   . Arthritis   . Headache     Past Surgical History:  Procedure Laterality Date  . APPENDECTOMY    . CESAREAN SECTION MULTI-GESTATIONAL  04/16/2020   Procedure: CESAREAN SECTION MULTI-GESTATIONAL;  Surgeon: Homero Fellers, MD;  Location: [facility] ORS;  Service: Obstetrics;;     Medications:  Outpatient Encounter Medications as of 09/13/2020  Medication Sig  . acetaminophen (TYLENOL) 500 MG tablet Take 2  tablets (1,000 mg total) by mouth every 6 (six) hours as needed for mild pain (or Fever >/= 101).  . citalopram (CELEXA) 20 MG tablet Take 1 tablet (20 mg total) by mouth daily. (Patient not taking: Reported on 09/13/2020)  . Prenatal Vit-Fe Fumarate-FA (PRENATAL VITAMINS) 28-0.8 MG TABS Take 1 each by mouth daily. (Patient not taking: Reported on 09/13/2020)   No facility-administered encounter medications on file as of 09/13/2020.    Allergies:  Allergies  Allergen Reactions  . Peanut Oil Anaphylaxis  . Shellfish Allergy Anaphylaxis  . Amoxapine And Related   . Apple Other (See Comments)  . Amoxicillin Rash    Family History: Family History  Problem Relation Age of Onset  . Hernia Father   . Hypertension Father   . Lung cancer Maternal Grandmother   . Heart disease Maternal Grandfather   . Diabetes Maternal Grandfather   . Hypertension Maternal Grandfather   . Anemia Paternal Grandmother   . Prostate cancer Paternal Grandfather     Social History: Social History   Tobacco Use  . Smoking status: Former Smoker    Packs/day: 0.50    Types: Cigarettes  . Smokeless tobacco: Never Used  Vaping Use  . Vaping Use: Never used  Substance Use Topics  . Alcohol use: Not Currently    Comment: on occasion  . Drug use: Never   Social History   Social History Narrative   Recently had twins    Right Handed   Lives in a one story  hom e    Vital Signs:  BP 133/79   Pulse 77   Ht 5\' 5"  (1.651 m)   Wt 273 lb (123.8 kg)   SpO2 97%   BMI 45.43 kg/m    General Medical Exam:   General:  Well appearing, comfortable, morbidly obese.   Tearful   Neurological Exam: MENTAL STATUS including orientation to time, place, person, recent and remote memory, attention span and concentration, language, and fund of knowledge is normal.  Speech is not dysarthric.  CRANIAL NERVES: II:  No visual field defects.     III-IV-VI: Pupils equal round and reactive to light.  Normal conjugate,  extra-ocular eye movements in all directions of gaze.  No nystagmus.  No ptosis.   V:  Normal facial sensation.    VII:  Normal facial symmetry and movements.  Poor dentition VIII:  Normal hearing and vestibular function.   IX-X:  Normal palatal movement.   XI:  Normal shoulder shrug and head rotation.   XII:  Normal tongue strength and range of motion, no deviation or fasciculation.  MOTOR:  No atrophy, fasciculations or abnormal movements.  No pronator drift.   Upper Extremity:  Right  Left  Deltoid  5/5   5/5   Biceps  5/5   5/5   Triceps  5/5   5/5   Infraspinatus 5/5  5/5  Medial pectoralis 5/5  5/5  Wrist extensors  5/5   5/5   Wrist flexors  5/5   5/5   Finger extensors  5/5   5/5   Finger flexors  5/5   5/5   Dorsal interossei  5/5   5/5   Abductor pollicis  5/5   5/5   Tone (Ashworth scale)  0  0   Lower Extremity:  Right  Left  Hip flexors  5/5   5/5   Hip extensors  5/5   5/5   Adductor 5/5  5/5  Abductor 5/5  5/5  Knee flexors  5/5   5/5   Knee extensors  5/5   5/5   Dorsiflexors  5/5   5/5   Plantarflexors  5/5   5/5   Toe extensors  5/5   5/5   Toe flexors  5/5   5/5   Tone (Ashworth scale)  0  0   MSRs:  Right        Left                  brachioradialis 2+  2+  biceps 2+  2+  triceps 2+  2+  patellar 2+  2+  ankle jerk 2+  2+  Hoffman no  no  plantar response down  down   SENSORY:  Reduced pin prick and temperature over the left lateral thigh, otherwise sensation is intact throughout to all modalities.  COORDINATION/GAIT: Normal finger-to- nose-finger and heel-to-shin.  Intact rapid alternating movements bilaterally.  Able to rise from a chair without using arms.  Gait narrow based and stable. Tandem and stressed gait intact.    IMPRESSION: 1.  Left meralgia paresthetica.  Her examination is consistent with diminished sensation in over the anterolateral thigh on the left side. Based on history of exam, she has meralgia paresthetica an entrapment of  the lateral femoral cutaneous nerve as it exits below the inguinal canal. Management is conservative with weight loss and avoidance of repetitive trauma to this region. I have discussed the diagnosis, pathophysiology, management plan, and risks and benefits of medications,  and prognosis.   - Avoid tight fitting clothing or belts   - Weight loss encouraged  - neuralgesic medication such as gabapentin declined  2.  Chronic lumbar strain. No worrisome findings on exam to suggest myelopathy or radiculopathy.  Patient became very frustrated and upset that I was not able to provide pain relief that she was expecting, even through she was made aware that we do not manage chronic pain and the referral was not for this condition.   - PT and muscle relaxants offered, patient declined and requested to leave the appointment   Thank you for allowing me to participate in patient's care.  If I can answer any additional questions, I would be pleased to do so.    Sincerely,    Jaclene Bartelt K. Allena Katz, DO

## 2020-09-13 NOTE — Assessment & Plan Note (Signed)
Discussed safety and efficacy of vaccination and and encouraged vaccination Patient remains precontemplative at this time

## 2020-09-13 NOTE — Patient Instructions (Signed)
If you would like to start physical therapy or need muscle relaxant or nerve medication, please call the office
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
## 2020-09-25 ENCOUNTER — Ambulatory Visit: Payer: Medicaid Other | Attending: Family Medicine

## 2020-09-25 ENCOUNTER — Other Ambulatory Visit: Payer: Self-pay

## 2020-09-25 DIAGNOSIS — M545 Low back pain, unspecified: Secondary | ICD-10-CM | POA: Insufficient documentation

## 2020-09-25 DIAGNOSIS — G8929 Other chronic pain: Secondary | ICD-10-CM | POA: Insufficient documentation

## 2020-09-25 DIAGNOSIS — M79652 Pain in left thigh: Secondary | ICD-10-CM | POA: Diagnosis present

## 2020-09-25 NOTE — Therapy (Signed)
[facility] PHYSICAL AND SPORTS MEDICINE [address] [place], [place], [address] Phone: [phone]   Fax:  [phone]  Physical Therapy Evaluation  Patient Details  Name: Crystal Hansen MRN: [id number] Date of Birth: Mar 01, 1991 Referring Provider (PT): Shirlee LatchAngela Bacigalupo, MD   Encounter Date: 09/25/2020   PT End of Session - 09/25/20 1022    Visit Number 1    Number of Visits 17    Date for PT Re-Evaluation 11/21/20    Authorization Type 1    Authorization Time Period 4 Medicaid    PT Start Time 1022    PT Stop Time 1109    PT Time Calculation (min) 47 min    Activity Tolerance Patient tolerated treatment well    Behavior [facility] assessed/performed           Past Medical History:  Diagnosis Date  . Allergy   . Anxiety   . Arthritis   . Headache     Past Surgical History:  Procedure Laterality Date  . APPENDECTOMY    . CESAREAN SECTION MULTI-GESTATIONAL  04/16/2020   Procedure: CESAREAN SECTION MULTI-GESTATIONAL;  Surgeon: Natale MilchSchuman, Christanna R, MD;  Location: [facility] ORS;  Service: Obstetrics;;  . WISDOM TOOTH EXTRACTION      There were no vitals filed for this visit.    Subjective Assessment - 09/25/20 1024    Subjective Pain: L low back to L lateral thigh (lateral femoral cutatneous nerve/L5 dermatome area, not past knees). Low back: 3/10 currently (pt sitting), 15/10 at worst for the past 3 months. L thigh: no pain but has numbness and tingling, 10/10 at worst for the pas 3 months which makes gait difficult.    Pertinent History Low back pain. Pain began 5 months ago on 04/16/2020 when pt gave birth to twins via C-section. L LE pain occurred first, followed by back pain about 3 months later. No loss of bowel or bladder control (other than from pregnancy). Denies saddle anesthesia.    Patient Stated Goals Decrease pain.    Currently in Pain? Yes    Pain Score 3     Pain Location Back    Pain  Orientation Left;Posterior;Lower    Pain Type Chronic pain    Pain Onset More than a month ago    Pain Frequency Occasional    Aggravating Factors  Back: bending over to get her twin sons out of the crib. bending over as well as gettiing from bending over (pain midway), standing up from sitting, lying down (on her back, both sides, and stomach). L thigh: unknown... just comes    Pain Relieving Factors Back: Bending forward to touch toes, rubbing her back. L thigh: unknown... just goes away.              [facility] - 09/25/20 1035      Assessment   Medical Diagnosis Chronic left-sided low back pain without sciatica    Referring Provider (PT) Shirlee LatchAngela Bacigalupo, MD    Onset Date/Surgical Date 09/13/20    Prior Therapy No known PT for current condition      Precautions   Precaution Comments no know precuations      Restrictions   Other Position/Activity Restrictions No known restrictions      Observation/Other Assessments   Observations Supine: R LE lomger. Long sit: R LE longer.    Focus on Therapeutic Outcomes (FOTO)  Lumbar FOTO 59      Posture/Postural Control  Posture Comments forward neck, B protracted shoulders, slight L trunk rotation, R iliact crest higher, R lateral shift      AROM   Lumbar Flexion Limited with L low back pain    Lumbar Extension limited with more centralize pain but still has L sided pain.    Lumbar - Right Side Bend limited with L lateral trunk/iliac pain    Lumbar - Left Side Bend limited with central and slightly L low back pain    Lumbar - Right Rotation WFL with slight L lateral thigh pain    Lumbar - Left Rotation WFL with more L lateral thigh pain      Strength   Right Hip Flexion 4-/5    Right Hip Extension 3/5    Right Hip ABduction 4/5   with L hip pain from pressure from table   Left Hip Flexion 4/5    Left Hip Extension 4-/5    Left Hip ABduction 4+/5    Right Knee Flexion 4/5    Right Knee Extension 5/5    Left Knee  Flexion 4/5    Left Knee Extension 5/5      Palpation   Palpation comment TTP L L5 > L4                      Objective measurements completed on examination: See above findings.       No latex allergies   Standing (short period of time): no back pain.   Therapeutic exercise Standing R lateral shift correction 10x5 seconds for 3 sets   No low back pain afterwards.   Reviewed and given as part of her HEP. Pt demonstrated and verbalized understanding. Handout provided.   seataed R hip extension isometrics 10x5 seconds for 3 sets  Seated L hip extension isometrics 20x5 seconds. Increased L hip symptoms. Exercise stopped.    Improved exercise technique, movement at target joints, use of target muscles after mod verbal, visual, tactile cues.   Response to treatment Decreased low back pain and L posterior lateral hip TTP after R lateral shift correction to improve posture.   Clinical impression Pt is a 30 year old female who came to physical therapy secondary to chronic low back and L thigh pain. She also presents with altered posture, trunk and hip weakness, most  reproduction of symptoms with lumbar flexion, R side bending and L lumbar rotation; TTP to L low back, and difficulty with tasks which involve bending over, as well as standing up from sitting, and lying down. Pt will benefit from skilled physical therapy services to address the aforementioned deficits.                  PT Education - 09/25/20 1142    Education Details Ther-ex, HEP    Person(s) Educated Patient    Methods Explanation;Demonstration;Tactile cues;Verbal cues;Handout    Comprehension Verbalized understanding;Returned demonstration            PT Short Term Goals - 09/25/20 1153      PT SHORT TERM GOAL #1   Title Pt will be independent with her initial HEP to decrease pain, improve strength, as well as ability to perform tasks which involve bending over more comfortably.     Baseline Pt started her HEP (09/25/2020)    Time 3    Period Weeks    Status New    Target Date 10/17/20             PT  Long Term Goals - 09/25/20 1155      PT LONG TERM GOAL #1   Title Patient will have a decrease on low back pain to 4/10 or less at most to promote ability to take care of her twin infants, as well as perform tasks which involve bending over more comfortably.    Baseline 15/10 low back pain at most for the past 3 months (09/25/2020)    Time 8    Period Weeks    Status New    Target Date 11/21/20      PT LONG TERM GOAL #2   Title Patient will have a decrease in L thigh pain to 3/10 or less at worst to promote ability to perform tasks which involve bending over more comfortably.    Baseline 10/10 at most for the past 3 months (09/25/2020)    Time 8    Period Weeks    Status New    Target Date 11/21/20      PT LONG TERM GOAL #3   Title Patient will improve bilateral hip extension and abduction strength by at least 1/2 MMT grade to promote ability to perform standing tasks more comfortably.    Baseline Hip extension 3/5 R, 4-/5 L, 4/5 R 4+/5 L (09/25/2020)    Time 8    Period Weeks    Status New    Target Date 11/21/20      PT LONG TERM GOAL #4   Title Patient will improve her FOTO score  by at least 10 points as a demonstration of improved function.    Baseline Lumbar Spine FOTO 59 (09/25/2020)    Time 8    Period Weeks    Status New    Target Date 11/21/20                  Plan - 09/25/20 1146    Clinical Impression Statement Pt is a 30 year old female who came to physical therapy secondary to chronic low back and L thigh pain. She also presents with altered posture, trunk and hip weakness, most  reproduction of symptoms with lumbar flexion, R side bending and L lumbar rotation; TTP to L low back, and difficulty with tasks which involve bending over, as well as standing up from sitting, and lying down. Pt will benefit from skilled physical therapy  services to address the aforementioned deficits.    Personal Factors and Comorbidities Comorbidity 2;Time since onset of injury/illness/exacerbation;Fitness    Comorbidities Arthritis, cesarean section multi-gestational    Examination-Activity Limitations Transfers;Bend;Lift;Caring for Others    Stability/Clinical Decision Making Stable/Uncomplicated    Clinical Decision Making Low    Rehab Potential Fair    PT Frequency 2x / week    PT Duration 8 weeks    PT Treatment/Interventions Therapeutic activities;Therapeutic exercise;Neuromuscular re-education;Patient/family education;Manual techniques;Dry needling;Spinal Manipulations;Joint Manipulations;Aquatic Therapy;Electrical Stimulation;Iontophoresis 4mg /ml Dexamethasone;Traction;Ultrasound    PT Next Visit Plan posture, gentle extension, trunk and hip strengthening, lumbopelvic control, manual techniques, modalities PRN.    Consulted and Agree with Plan of Care Patient           Patient will benefit from skilled therapeutic intervention in order to improve the following deficits and impairments:  Pain,Postural dysfunction,Improper body mechanics,Decreased strength  Visit Diagnosis: Chronic left-sided low back pain, unspecified whether sciatica present - Plan: PT plan of care cert/re-cert  Pain in left thigh - Plan: PT plan of care cert/re-cert     Problem List Patient Active Problem List   Diagnosis Date  Noted  . COVID-19 vaccination declined 09/13/2020  . Chronic left-sided low back pain without sciatica 09/13/2020  . Meralgia paresthetica of left side 09/13/2020  . Cesarean wound infection 05/03/2020  . Twins, both liveborn   . Allergic rhinitis 02/23/2019     Loralyn Freshwater PT, DPT   09/25/2020, 12:18 PM  [facility] PHYSICAL AND SPORTS MEDICINE [address] [place], [address] Phone: [phone]   Fax:  [phone]  Name: Crystal Hansen MRN: [id number] Date of Birth:  [birth year]

## 2020-09-25 NOTE — Patient Instructions (Signed)
Standing R lateral shift correction 10x5 seconds for 3 sets   No low back pain afterwards.   Reviewed and given as part of her HEP. Pt demonstrated and verbalized understanding. Handout provided.

## 2020-10-02 ENCOUNTER — Ambulatory Visit: Payer: Medicaid Other

## 2020-10-07 ENCOUNTER — Ambulatory Visit: Payer: Medicaid Other

## 2020-10-09 ENCOUNTER — Ambulatory Visit: Payer: Medicaid Other | Attending: Family Medicine

## 2020-10-09 ENCOUNTER — Other Ambulatory Visit: Payer: Self-pay

## 2020-10-09 DIAGNOSIS — G8929 Other chronic pain: Secondary | ICD-10-CM | POA: Insufficient documentation

## 2020-10-09 DIAGNOSIS — M545 Low back pain, unspecified: Secondary | ICD-10-CM | POA: Insufficient documentation

## 2020-10-09 DIAGNOSIS — M79652 Pain in left thigh: Secondary | ICD-10-CM | POA: Diagnosis present

## 2020-10-09 NOTE — Therapy (Signed)
[place] [facility]TS [facility]ICINE [address] [place], [address] Phone: [phone]   Fax:  [phone]  Physical Therapy Treatment  Patient Details  Name: Crystal Hansen MRN: [id number] Date of Birth: May 27, 1991 Referring Provider (PT): Shirlee Latch, MD   Encounter Date: 10/09/2020   PT End of Session - 10/09/20 0923    Visit Number 2    Number of Visits 17    Date for PT Re-Evaluation 11/21/20    Authorization Type 2    Authorization Time Period 4 Medicaid    PT Start Time [phone]    PT Stop Time 1002    PT Time Calculation (min) 39 min    Activity Tolerance Patient tolerated treatment well    Behavior During Therapy [facility] for tasks assessed/performed           Past Medical History:  Diagnosis Date  . Allergy   . Anxiety   . Arthritis   . Headache     Past Surgical History:  Procedure Laterality Date  . APPENDECTOMY    . CESAREAN SECTION MULTI-GESTATIONAL  04/16/2020   Procedure: CESAREAN SECTION MULTI-GESTATIONAL;  Surgeon: Natale Milch, MD;  Location: [facility] ORS;  Service: Obstetrics;;  . WISDOM TOOTH EXTRACTION      There were no vitals filed for this visit.   Subjective Assessment - 10/09/20 0927    Subjective Back is doing a lot better. L lateral thigh is doing a lot better too.  0.5/10 low back currently, 0/10 L thigh pain.  3/10 back pain at most and 5/10 L thigh pain at most for the past 7 days.    Pertinent History Low back pain. Pain began 5 months ago on 04/16/2020 when pt gave birth to twins via C-section. L LE pain occurred first, followed by back pain about 3 months later. No loss of bowel or bladder control (other than from pregnancy). Denies saddle anesthesia.    Patient Stated Goals Decrease pain.    Currently in Pain? Yes    Pain Score 1    0.5/10   Pain Location Back    Pain Onset More than a month ago              [facility] PT Assessment - 10/09/20 1102       Observation/Other Assessments   Focus on Therapeutic Outcomes (FOTO)  94                                 PT Education - 10/09/20 0934    Education Details ther-ex, HEP    Person(s) Educated Patient    Methods Explanation;Demonstration;Tactile cues;Verbal cues;Handout    Comprehension Returned demonstration;Verbalized understanding           Objective    No latex allergies   Standing (short period of time): no back pain.   Medbridge Access Code [email or web address]  Therapeutic exercise Standing PNF chops to the R to decrease R lumbar rotation posture. Blue band 10x5 seconds for 2 sets  Then double blue band 10x5 seconds for 2 sets  Standing gentle lumbar extension 10x5 seconds for 2 sets   Standing B shoulder extension with scapular retraction blue band 10x2  standing L shoulder adduction blue band 10x3 with 5 seconds   No pain afterwards  Seated hip extension isometrics   R 10x5 seconds for 3 sets  Seated B shoulder extension isometrics 10x3 with 5 second holds  Seated manually resisted L lateral shift isometrics 10x5 seconds for 3 sets    Reviewed HEP. Pt demonstrated and verbalized understanding. Handout provided.        Improved exercise technique, movement at target joints, use of target muscles after mod verbal, visual, tactile cues.   Response to treatment No back pain and thigh pain after session.     Clinical impression Very good progress with decreased back pain from initial evaluation based on subjective reports. Pt also demonstrates significant improvement in function based on recent FOTO scores. Continued working on improving posture as well as trunk strength to continue progress. Pt tolerated session well without aggravation of symptoms. Pt will benefit from continued skilled physical therapy services to decrease pain, improve strength and function.        PT Short Term Goals - 09/25/20 1153      PT SHORT TERM GOAL #1    Title Pt will be independent with her initial HEP to decrease pain, improve strength, as well as ability to perform tasks which involve bending over more comfortably.    Baseline Pt started her HEP (09/25/2020)    Time 3    Period Weeks    Status New    Target Date 10/17/20             PT Long Term Goals - 09/25/20 1155      PT LONG TERM GOAL #1   Title Patient will have a decrease on low back pain to 4/10 or less at most to promote ability to take care of her twin infants, as well as perform tasks which involve bending over more comfortably.    Baseline 15/10 low back pain at most for the past 3 months (09/25/2020)    Time 8    Period Weeks    Status New    Target Date 11/21/20      PT LONG TERM GOAL #2   Title Patient will have a decrease in L thigh pain to 3/10 or less at worst to promote ability to perform tasks which involve bending over more comfortably.    Baseline 10/10 at most for the past 3 months (09/25/2020)    Time 8    Period Weeks    Status New    Target Date 11/21/20      PT LONG TERM GOAL #3   Title Patient will improve bilateral hip extension and abduction strength by at least 1/2 MMT grade to promote ability to perform standing tasks more comfortably.    Baseline Hip extension 3/5 R, 4-/5 L, 4/5 R 4+/5 L (09/25/2020)    Time 8    Period Weeks    Status New    Target Date 11/21/20      PT LONG TERM GOAL #4   Title Patient will improve her FOTO score  by at least 10 points as a demonstration of improved function.    Baseline Lumbar Spine FOTO 59 (09/25/2020)    Time 8    Period Weeks    Status New    Target Date 11/21/20                 Plan - 10/09/20 0925    Clinical Impression Statement Very good progress with decreased back pain from initial evaluation based on subjective reports. Pt also demonstrates significant improvement in function based on recent FOTO scores. Continued working on improving posture as well as trunk strength to continue  progress. Pt tolerated session well without aggravation of  symptoms. Pt will benefit from continued skilled physical therapy services to decrease pain, improve strength and function.    Personal Factors and Comorbidities Comorbidity 2;Time since onset of injury/illness/exacerbation;Fitness    Comorbidities Arthritis, cesarean section multi-gestational    Examination-Activity Limitations Transfers;Bend;Lift;Caring for Others    Stability/Clinical Decision Making Stable/Uncomplicated    Rehab Potential Fair    PT Frequency 2x / week    PT Duration 8 weeks    PT Treatment/Interventions Therapeutic activities;Therapeutic exercise;Neuromuscular re-education;Patient/family education;Manual techniques;Dry needling;Spinal Manipulations;Joint Manipulations;Aquatic Therapy;Electrical Stimulation;Iontophoresis 4mg /ml Dexamethasone;Traction;Ultrasound    PT Next Visit Plan posture, gentle extension, trunk and hip strengthening, lumbopelvic control, manual techniques, modalities PRN.    Consulted and Agree with Plan of Care Patient           Patient will benefit from skilled therapeutic intervention in order to improve the following deficits and impairments:  Pain,Postural dysfunction,Improper body mechanics,Decreased strength  Visit Diagnosis: Chronic left-sided low back pain, unspecified whether sciatica present  Pain in left thigh     Problem List Patient Active Problem List   Diagnosis Date Noted  . COVID-19 vaccination declined 09/13/2020  . Chronic left-sided low back pain without sciatica 09/13/2020  . Meralgia paresthetica of left side 09/13/2020  . Cesarean wound infection 05/03/2020  . Twins, both liveborn   . Allergic rhinitis 02/23/2019    02/25/2019 PT, DPT   10/09/2020, 11:06 AM  [place] [facility] PHYSICAL AND SPORTS MEDICINE [address], [address], [place] Phone: [phone]   Fax:  [phone]  Name: Crystal Hansen MRN:  Sherryl Barters Date of Birth: 04/23/1991

## 2020-10-09 NOTE — Patient Instructions (Addendum)
   Access Code: [email or web address] URL: [email or web address] Date: 10/09/2020 Prepared by: Loralyn Freshwater  Exercises Shoulder Adduction with Anchored Resistance - 1 x daily - 7 x weekly - 3 sets - 10 reps - 5 seconds hold     Seated hip extension isometrics   Sitting on a chair,    Squeeze your rear end muscles together and press your right foot onto the floor.     Hold for 5 seconds    Repeat 10 times   Perform 3 sets daily.      This is a corrective exercise. Once you no longer have symptoms, you can stop.       Sitting on a chair    Press your hands on your thighs to feel your abdominal muscles contract.   Hold for 5 seconds comfortably.   Repeat 10 times.   Perform 3 sets daily.

## 2020-10-15 ENCOUNTER — Ambulatory Visit: Payer: Medicaid Other

## 2020-10-15 ENCOUNTER — Telehealth: Payer: Self-pay

## 2020-10-15 NOTE — Telephone Encounter (Signed)
No show. Called patient who said that she forgot. Will be able to make it to her next session. Back and L thigh are still doing really good.

## 2020-10-17 ENCOUNTER — Other Ambulatory Visit: Payer: Self-pay

## 2020-10-17 ENCOUNTER — Ambulatory Visit: Payer: Medicaid Other

## 2020-10-17 DIAGNOSIS — M545 Low back pain, unspecified: Secondary | ICD-10-CM | POA: Diagnosis not present

## 2020-10-17 DIAGNOSIS — M79652 Pain in left thigh: Secondary | ICD-10-CM

## 2020-10-17 NOTE — Therapy (Signed)
[facility] [address], [place], [address] Phone: [phone]   Fax:  [phone]  Physical Therapy Treatment  Patient Details  Name: LAVANA HUCKEBA MRN: [id number] Date of Birth: 12-14-1990 Referring Provider (PT): Shirlee Latch, MD   Encounter Date: 10/17/2020   PT End of Session - 10/17/20 1635    Visit Number 3    Number of Visits 17    Date for PT Re-Evaluation 11/21/20    Authorization - Visit Number 2    Authorization - Number of Visits 3    PT Start Time 1629    PT Stop Time 1709    PT Time Calculation (min) 40 min    Activity Tolerance Patient tolerated treatment well;No increased pain    Behavior During Therapy WFL for tasks assessed/performed           Past Medical History:  Diagnosis Date  . Allergy   . Anxiety   . Arthritis   . Headache     Past Surgical History:  Procedure Laterality Date  . APPENDECTOMY    . CESAREAN SECTION MULTI-GESTATIONAL  04/16/2020   Procedure: CESAREAN SECTION MULTI-GESTATIONAL;  Surgeon: Natale Milch, MD;  Location: [facility] ORS;  Service: Obstetrics;;  . WISDOM TOOTH EXTRACTION      There were no vitals filed for this visit.   Subjective Assessment - 10/17/20 1634    Subjective Pt doing well still. Reports back pain has largely resolved, with some intermittent spikes during parenting duties, but she is able to manage this pain fully with tylenol and/or meloxicam.    Pertinent History Low back pain. Pain began 5 months ago on 04/16/2020 when pt gave birth to twins via C-section. L LE pain occurred first, followed by back pain about 3 months later. No loss of bowel or bladder control (other than from pregnancy). Denies saddle anesthesia.    Currently in Pain? No/denies           INTERVENTION THIS DATE: -Wall leaning lateral shift side glide correction 1x15, then 1x15 with progressed foot stance from wall (+4 inches)  -PNF Chop, Blue  TB Left rotation 1x15x5secH  -Quadruped breathing coordination training with diaphragm, transverse abdominus 2x10 -seated transversus abdominus, independent of breathing 1x10 -education on relationship of deep and superficial abdominal layers for core-retraining post-partum -seated bracing marching, alternating pattern 1x20, cued with draw-in maneuver  -STS from plinth coordinated with draw-in 2x10  -Quadruped hip extension, c bracing/draw-maneuver 2x20, alternating -lateral side step, 1x30 alteranting, Blue TB, then 1x30 black band       PT Short Term Goals - 09/25/20 1153      PT SHORT TERM GOAL #1   Title Pt will be independent with her initial HEP to decrease pain, improve strength, as well as ability to perform tasks which involve bending over more comfortably.    Baseline Pt started her HEP (09/25/2020)    Time 3    Period Weeks    Status New    Target Date 10/17/20             PT Long Term Goals - 09/25/20 1155      PT LONG TERM GOAL #1   Title Patient will have a decrease on low back pain to 4/10 or less at most to promote ability to take care of her twin infants, as well as perform tasks which involve bending over more comfortably.    Baseline 15/10 low back pain at most for  the past 3 months (09/25/2020)    Time 8    Period Weeks    Status New    Target Date 11/21/20      PT LONG TERM GOAL #2   Title Patient will have a decrease in L thigh pain to 3/10 or less at worst to promote ability to perform tasks which involve bending over more comfortably.    Baseline 10/10 at most for the past 3 months (09/25/2020)    Time 8    Period Weeks    Status New    Target Date 11/21/20      PT LONG TERM GOAL #3   Title Patient will improve bilateral hip extension and abduction strength by at least 1/2 MMT grade to promote ability to perform standing tasks more comfortably.    Baseline Hip extension 3/5 R, 4-/5 L, 4/5 R 4+/5 L (09/25/2020)    Time 8    Period Weeks    Status  New    Target Date 11/21/20      PT LONG TERM GOAL #4   Title Patient will improve her FOTO score  by at least 10 points as a demonstration of improved function.    Baseline Lumbar Spine FOTO 59 (09/25/2020)    Time 8    Period Weeks    Status New    Target Date 11/21/20                 Plan - 10/17/20 1638    Clinical Impression Statement Pt conitnues to progress very quickly, pain much improved. Continued with current plan of care as laid out in evaluation and recent prior sessions. Pt remains motivated to advance progress toward goals. Rest breaks provided as needed, pt quick to ask when needed. Author maintains all interventions within appropriate level of intensity as not to purposefully exacerbate pain. Pt does require varying levels of assistance and cuing for completion of exercises for correct form and sometimes due to pain/weakness. Pt continues to demonstrate progress toward goals AEB progression of some interventions this date either in volume or intensity. No updates to HEP this date.    Personal Factors and Comorbidities Comorbidity 2;Time since onset of injury/illness/exacerbation;Fitness    Comorbidities Arthritis, cesarean section multi-gestational    Examination-Activity Limitations Transfers;Bend;Lift;Caring for Others    Stability/Clinical Decision Making Stable/Uncomplicated    Clinical Decision Making Low    Rehab Potential Fair    PT Frequency 2x / week    PT Duration 8 weeks    PT Treatment/Interventions Therapeutic activities;Therapeutic exercise;Neuromuscular re-education;Patient/family education;Manual techniques;Dry needling;Spinal Manipulations;Joint Manipulations;Aquatic Therapy;Electrical Stimulation;Iontophoresis 4mg /ml Dexamethasone;Traction;Ultrasound    PT Next Visit Plan posture, gentle extension, trunk and hip strengthening, lumbopelvic control, manual techniques, modalities PRN.    PT Home Exercise Plan no updates this date    Consulted and Agree  with Plan of Care Patient           Patient will benefit from skilled therapeutic intervention in order to improve the following deficits and impairments:  Pain,Postural dysfunction,Improper body mechanics,Decreased strength  Visit Diagnosis: Chronic left-sided low back pain, unspecified whether sciatica present  Pain in left thigh     Problem List Patient Active Problem List   Diagnosis Date Noted  . COVID-19 vaccination declined 09/13/2020  . Chronic left-sided low back pain without sciatica 09/13/2020  . Meralgia paresthetica of left side 09/13/2020  . Cesarean wound infection 05/03/2020  . Twins, both liveborn   . Allergic rhinitis 02/23/2019   5:05 PM, 10/17/20  Rosamaria Lints, PT, DPT Physical Therapist - [place] [phone] (Office)   Lorena Clearman C 10/17/2020, 4:45 PM  [place] [facility] PH[facility] SPORTS MEDICINE [address] [place], [address] Phone: [phone]   Fax:  [phone]  Name: SHAUNTEE KARP MRN: [id number] Date of Birth: [birth year]

## 2020-10-22 ENCOUNTER — Ambulatory Visit: Payer: Medicaid Other

## 2020-10-22 ENCOUNTER — Other Ambulatory Visit: Payer: Self-pay

## 2020-10-22 DIAGNOSIS — M545 Low back pain, unspecified: Secondary | ICD-10-CM

## 2020-10-22 DIAGNOSIS — M79652 Pain in left thigh: Secondary | ICD-10-CM

## 2020-10-22 DIAGNOSIS — G8929 Other chronic pain: Secondary | ICD-10-CM

## 2020-10-22 NOTE — Patient Instructions (Signed)
Access Code: [email or web address] URL: [email or web address] Date: 10/22/2020 Prepared by: Loralyn Freshwater  Exercises Shoulder Adduction with Anchored Resistance - 1 x daily - 7 x weekly - 3 sets - 10 reps - 5 seconds hold Seated Lat Pull Down with Resistance - Elbows Bent - 1 x daily - 7 x weekly - 3 sets - 10 reps - 5 seconds hold Clamshell with Resistance - 1 x daily - 7 x weekly - 3 sets - 10 reps - 5 seconds hold

## 2020-10-22 NOTE — Therapy (Signed)
[place] [facility] [address], [place], [address] Phone: [phone]   Fax:  [phone]  Physical Therapy Treatment  Patient Details  Name: Crystal Hansen MRN: [id number] Date of Birth: 05/25/1991 Referring Provider (PT): Shirlee Latch, MD   Encounter Date: 10/22/2020   PT End of Session - 10/22/20 0916    Visit Number 4    Number of Visits 17    Date for PT Re-Evaluation 11/21/20    Authorization - Visit Number 3    Authorization - Number of Visits 3    PT Start Time [phone]   pt arrived late   PT Stop Time 0957    PT Time Calculation (min) 40 min    Activity Tolerance Patient tolerated treatment well;No increased pain    Behavior During Therapy WFL for tasks assessed/performed           Past Medical History:  Diagnosis Date  . Allergy   . Anxiety   . Arthritis   . Headache     Past Surgical History:  Procedure Laterality Date  . APPENDECTOMY    . CESAREAN SECTION MULTI-GESTATIONAL  04/16/2020   Procedure: CESAREAN SECTION MULTI-GESTATIONAL;  Surgeon: Natale Milch, MD;  Location: [facility] ORS;  Service: Obstetrics;;  . WISDOM TOOTH EXTRACTION      There were no vitals filed for this visit.   Subjective Assessment - 10/22/20 0918    Subjective Back is good today, no pain. L thigh is doing a lot better today as well. 4-5/10 low back, and L thigh at most for the past 7 days. Freqency (only 1-2 days a week) is less compared to before starting PT.    Pertinent History Low back pain. Pain began 5 months ago on 04/16/2020 when pt gave birth to twins via C-section. L LE pain occurred first, followed by back pain about 3 months later. No loss of bowel or bladder control (other than from pregnancy). Denies saddle anesthesia.    Currently in Pain? No/denies    Pain Score 0-No pain                                     PT Education - 10/22/20 0925    Education Details  ther-ex, HEP    Person(s) Educated Patient    Methods Explanation;Demonstration;Tactile cues;Verbal cues;Handout    Comprehension Returned demonstration;Verbalized understanding          Objective   [email or web address]   No latex allergies  Standing (short period of time): no back pain.   Medbridge Access Code [email or web address]  Therapeutic exercise   Seated manually resisted L lateral shift isometrics 10x5 seconds for 3 sets  Standing back extension 10x5 seconds for 3 sts  Lat pull downs blue band 10x5 seconds for 2 sets.   S/L clamshell   R 10x5 seconds   L 10x5 seconds  Reviewed new HEP. Pt demonstrated and verbalized understanding. Handout provided.   Standing B shoulder extension blue band 10x5 seconds, then 10x  Static mini lunge  R 10x2  L 10x2  Seated B shoulder extension isometrics 10x2 with 10 second holds   Quadruped hip extension   R 10x 5 seconds  L 10x5 seconds   Improved exercise technique, movement at target joints, use of target muscles after mod verbal, visual, tactile cues.  Response to treatment No complain of pain throughout session  Clinical impression  Pt continues to demonstrate overall improved pain level in low back and L thigh with reports of decreased frequency of pain as well. Contniued working on decreasing lateral shift, extension based exercises, trunk and glute strength to help decrease stress to low back structures. Pt tolerated session well without aggravation of symptoms. Pt will benefit from continued skilled physical therapy services to decrease pain, improve strength and function.         PT Short Term Goals - 09/25/20 1153      PT SHORT TERM GOAL #1   Title Pt will be independent with her initial HEP to decrease pain, improve strength, as well as ability to perform tasks which involve bending over more comfortably.    Baseline Pt started her HEP (09/25/2020)    Time 3    Period Weeks    Status New     Target Date 10/17/20             PT Long Term Goals - 09/25/20 1155      PT LONG TERM GOAL #1   Title Patient will have a decrease on low back pain to 4/10 or less at most to promote ability to take care of her twin infants, as well as perform tasks which involve bending over more comfortably.    Baseline 15/10 low back pain at most for the past 3 months (09/25/2020)    Time 8    Period Weeks    Status New    Target Date 11/21/20      PT LONG TERM GOAL #2   Title Patient will have a decrease in L thigh pain to 3/10 or less at worst to promote ability to perform tasks which involve bending over more comfortably.    Baseline 10/10 at most for the past 3 months (09/25/2020)    Time 8    Period Weeks    Status New    Target Date 11/21/20      PT LONG TERM GOAL #3   Title Patient will improve bilateral hip extension and abduction strength by at least 1/2 MMT grade to promote ability to perform standing tasks more comfortably.    Baseline Hip extension 3/5 R, 4-/5 L, 4/5 R 4+/5 L (09/25/2020)    Time 8    Period Weeks    Status New    Target Date 11/21/20      PT LONG TERM GOAL #4   Title Patient will improve her FOTO score  by at least 10 points as a demonstration of improved function.    Baseline Lumbar Spine FOTO 59 (09/25/2020)    Time 8    Period Weeks    Status New    Target Date 11/21/20                 Plan - 10/22/20 0925    Clinical Impression Statement Pt continues to demonstrate overall improved pain level in low back and L thigh with reports of decreased frequency of pain as well. Contniued working on decreasing lateral shift, extension based exercises, trunk and glute strength to help decrease stress to low back structures. Pt tolerated session well without aggravation of symptoms. Pt will benefit from continued skilled physical therapy services to decrease pain, improve strength and function.    Personal Factors and Comorbidities Comorbidity 2;Time since onset  of injury/illness/exacerbation;Fitness    Comorbidities Arthritis, cesarean section multi-gestational    Examination-Activity Limitations Transfers;Bend;Lift;Caring for Others    Stability/Clinical Decision Making Stable/Uncomplicated  Clinical Decision Making Low    Rehab Potential Fair    PT Frequency 2x / week    PT Duration 8 weeks    PT Treatment/Interventions Therapeutic activities;Therapeutic exercise;Neuromuscular re-education;Patient/family education;Manual techniques;Dry needling;Spinal Manipulations;Joint Manipulations;Aquatic Therapy;Electrical Stimulation;Iontophoresis 4mg /ml Dexamethasone;Traction;Ultrasound    PT Next Visit Plan posture, gentle extension, trunk and hip strengthening, lumbopelvic control, manual techniques, modalities PRN.    PT Home Exercise Plan no updates this date    Consulted and Agree with Plan of Care Patient           Patient will benefit from skilled therapeutic intervention in order to improve the following deficits and impairments:  Pain,Postural dysfunction,Improper body mechanics,Decreased strength  Visit Diagnosis: Chronic left-sided low back pain, unspecified whether sciatica present  Pain in left thigh     Problem List Patient Active Problem List   Diagnosis Date Noted  . COVID-19 vaccination declined 09/13/2020  . Chronic left-sided low back pain without sciatica 09/13/2020  . Meralgia paresthetica of left side 09/13/2020  . Cesarean wound infection 05/03/2020  . Twins, both liveborn   . Allergic rhinitis 02/23/2019    02/25/2019 PT, DPT   10/22/2020, 11:04 AM  [place] [facility] PHYSICAL AND SPORTS MEDICINE [address], [place] Phone: [phone]   Fax:  [phone]  Name: TARYN SHELLHAMMER MRN: Sherryl Barters Date of Birth: 08-19-1991

## 2020-10-24 ENCOUNTER — Other Ambulatory Visit: Payer: Self-pay

## 2020-10-24 ENCOUNTER — Ambulatory Visit: Payer: Medicaid Other

## 2020-10-24 DIAGNOSIS — M545 Low back pain, unspecified: Secondary | ICD-10-CM

## 2020-10-24 DIAGNOSIS — G8929 Other chronic pain: Secondary | ICD-10-CM

## 2020-10-24 DIAGNOSIS — M79652 Pain in left thigh: Secondary | ICD-10-CM

## 2020-10-24 NOTE — Therapy (Signed)
[place] [facility] [address] [place], [address] Phone: [phone]   Fax:  [phone]  Physical Therapy Treatment  Patient Details  Name: Crystal Hansen MRN: [id number] Date of Birth: 1991-02-17 Referring Provider (PT): Shirlee Latch, MD   Encounter Date: 10/24/2020   PT End of Session - 10/24/20 0816    Visit Number 5    Number of Visits 17    Date for PT Re-Evaluation 11/21/20    PT Start Time 0816    PT Stop Time 0849    PT Time Calculation (min) 33 min    Activity Tolerance Patient tolerated treatment well;No increased pain    Behavior During Therapy WFL for tasks assessed/performed           Past Medical History:  Diagnosis Date  . Allergy   . Anxiety   . Arthritis   . Headache     Past Surgical History:  Procedure Laterality Date  . APPENDECTOMY    . CESAREAN SECTION MULTI-GESTATIONAL  04/16/2020   Procedure: CESAREAN SECTION MULTI-GESTATIONAL;  Surgeon: Natale Milch, MD;  Location: [facility] OR[facility];  Service: Obstetrics;;  . WISDOM TOOTH EXTRACTION      There were no vitals filed for this visit.   Subjective Assessment - 10/24/20 0817    Subjective Back is doing great. Was stiff this morning but stretching helps. No back pain currently. L thigh is doing great this morning. Was fine yesterday.    Pertinent History Low back pain. Pain began 5 months ago on 04/16/2020 when pt gave birth to twins via C-section. L LE pain occurred first, followed by back pain about 3 months later. No loss of bowel or bladder control (other than from pregnancy). Denies saddle anesthesia.    Currently in Pain? No/denies    Pain Score 0-No pain                                     PT Education - 10/24/20 0824    Education Details ther-ex    Person(s) Educated Patient    Methods Explanation;Demonstration;Tactile cues;Verbal cues    Comprehension Returned  demonstration;Verbalized understanding          Objective   [email or web address]   No latex allergies  Standing (short period of time): no back pain.   MedbridgeAccess Code [email or web address]  Therapeutic exercise  Seated hip extension isometrics   L 10x5 seconds for 2 sets  Lunges static  L 10x5 seconds for 2 sets  Seated manually resisted L lateral shift isometrics 10x5 seconds for 3 sets  Seated B shoulder extension isometrics 10x2 with 10 second holds   Standing hip extension red band with B UE assist   R 10x3 with 5 second holds   L 10x3 with 5 second holds   Standing back extension 10x5 seconds for 3 sets    Improved exercise technique, movement at target joints, use of target muscles after mod verbal, visual, tactile cues.  Response to treatment No complain of pain throughout session  Clinical impression Pt continues to report of improved symptoms from her low back and L lateral thigh. Continued working on decreasing lateral shift, as well as improving trunk and glute strength to continue progress. Pt tolerated session well without aggravation of symptoms. Pt will benefit from continued skilled physical therapy services to decrease pain, improve strength and function. Session ended early secondary to pt  coughing.          PT Short Term Goals - 09/25/20 1153      PT SHORT TERM GOAL #1   Title Pt will be independent with her initial HEP to decrease pain, improve strength, as well as ability to perform tasks which involve bending over more comfortably.    Baseline Pt started her HEP (09/25/2020)    Time 3    Period Weeks    Status New    Target Date 10/17/20             PT Long Term Goals - 09/25/20 1155      PT LONG TERM GOAL #1   Title Patient will have a decrease on low back pain to 4/10 or less at most to promote ability to take care of her twin infants, as well as perform tasks which involve bending over more comfortably.     Baseline 15/10 low back pain at most for the past 3 months (09/25/2020)    Time 8    Period Weeks    Status New    Target Date 11/21/20      PT LONG TERM GOAL #2   Title Patient will have a decrease in L thigh pain to 3/10 or less at worst to promote ability to perform tasks which involve bending over more comfortably.    Baseline 10/10 at most for the past 3 months (09/25/2020)    Time 8    Period Weeks    Status New    Target Date 11/21/20      PT LONG TERM GOAL #3   Title Patient will improve bilateral hip extension and abduction strength by at least 1/2 MMT grade to promote ability to perform standing tasks more comfortably.    Baseline Hip extension 3/5 R, 4-/5 L, 4/5 R 4+/5 L (09/25/2020)    Time 8    Period Weeks    Status New    Target Date 11/21/20      PT LONG TERM GOAL #4   Title Patient will improve her FOTO score  by at least 10 points as a demonstration of improved function.    Baseline Lumbar Spine FOTO 59 (09/25/2020)    Time 8    Period Weeks    Status New    Target Date 11/21/20                 Plan - 10/24/20 0816    Clinical Impression Statement Pt continues to report of improved symptoms from her low back and L lateral thigh. Continued working on decreasing lateral shift, as well as improving trunk and glute strength to continue progress. Pt tolerated session well without aggravation of symptoms. Pt will benefit from continued skilled physical therapy services to decrease pain, improve strength and function. Session ended early secondary to pt coughing.    Personal Factors and Comorbidities Comorbidity 2;Time since onset of injury/illness/exacerbation;Fitness    Comorbidities Arthritis, cesarean section multi-gestational    Examination-Activity Limitations Transfers;Bend;Lift;Caring for Others    Stability/Clinical Decision Making Stable/Uncomplicated    Rehab Potential Fair    PT Frequency 2x / week    PT Duration 8 weeks    PT Treatment/Interventions  Therapeutic activities;Therapeutic exercise;Neuromuscular re-education;Patient/family education;Manual techniques;Dry needling;Spinal Manipulations;Joint Manipulations;Aquatic Therapy;Electrical Stimulation;Iontophoresis 4mg /ml Dexamethasone;Traction;Ultrasound    PT Next Visit Plan posture, gentle extension, trunk and hip strengthening, lumbopelvic control, manual techniques, modalities PRN.    PT Home Exercise Plan no updates this date    Consulted  and Agree with Plan of Care Patient           Patient will benefit from skilled therapeutic intervention in order to improve the following deficits and impairments:  Pain,Postural dysfunction,Improper body mechanics,Decreased strength  Visit Diagnosis: Chronic left-sided low back pain, unspecified whether sciatica present  Pain in left thigh     Problem List Patient Active Problem List   Diagnosis Date Noted  . COVID-19 vaccination declined 09/13/2020  . Chronic left-sided low back pain without sciatica 09/13/2020  . Meralgia paresthetica of left side 09/13/2020  . Cesarean wound infection 05/03/2020  . Twins, both liveborn   . Allergic rhinitis 02/23/2019     Loralyn Freshwater PT, DPT   10/24/2020, 10:12 AM  [place] [facility] PHYSICAL AND SPORTS MEDICINE [address] [place], [address] Phone: [phone]   Fax:  [phone]  Name: Crystal Hansen MRN: [id number] Date of Birth: 14-Dec-1990

## 2020-10-29 ENCOUNTER — Other Ambulatory Visit: Payer: Self-pay

## 2020-10-29 ENCOUNTER — Ambulatory Visit: Payer: Medicaid Other

## 2020-10-29 DIAGNOSIS — M545 Low back pain, unspecified: Secondary | ICD-10-CM | POA: Diagnosis not present

## 2020-10-29 DIAGNOSIS — M79652 Pain in left thigh: Secondary | ICD-10-CM

## 2020-10-29 NOTE — Therapy (Signed)
[facility] [address] [place], [address] Phone: [phone]   Fax:  [phone]  Physical Therapy Treatment  Patient Details  Name: KEYANNA SANDEFER MRN: [id number] Date of Birth: [birth year] Referring Provider (PT): Shirlee Latch, MD   Encounter Date: 10/29/2020   PT End of Session - 10/29/20 0902    Visit Number 6    Number of Visits 17    Date for PT Re-Evaluation 11/21/20    PT Start Time 0902    PT Stop Time 0943    PT Time Calculation (min) 41 min    Activity Tolerance Patient tolerated treatment well;No increased pain    Behavior During Therapy WFL for tasks assessed/performed           Past Medical History:  Diagnosis Date  . Allergy   . Anxiety   . Arthritis   . Headache     Past Surgical History:  Procedure Laterality Date  . APPENDECTOMY    . CESAREAN SECTION MULTI-GESTATIONAL  04/16/2020   Procedure: CESAREAN SECTION MULTI-GESTATIONAL;  Surgeon: Natale Milch, MD;  Location: [facility] ORS;  Service: Obstetrics;;  . WISDOM TOOTH EXTRACTION      There were no vitals filed for this visit.   Subjective Assessment - 10/29/20 0903    Subjective Back is doing good. No pain currently other than the stiffness first thing in the morning. The L thigh is good. 3-4/10 low back and L thigh pain at most for the past 7 days. Picking up her inftants is better.    Pertinent History Low back pain. Pain began 5 months ago on 04/16/2020 when pt gave birth to twins via C-section. L LE pain occurred first, followed by back pain about 3 months later. No loss of bowel or bladder control (other than from pregnancy). Denies saddle anesthesia.    Currently in Pain? No/denies    Pain Score 0-No pain                                     PT Education - 10/29/20 0919    Education Details ther-ex    Person(s) Educated Patient    Methods Explanation;Demonstration;Tactile  cues;Verbal cues    Comprehension Returned demonstration;Verbalized understanding          Objective  [email or web address]   No latex allergies  Standing (short period of time): no back pain.   [email or web address] Code [email or web address]  Therapeutic exercise  Seated manually resisted L lateral shift isometrics 10x5 seconds for 3 sets  Paloff press red double band  R 10x5 seconds for 3 sets  L 10x5 seconds for 3 sets  Lunges static             L 10x5 seconds for 2 sets  Standing B shoulder extension blue band  10x, then 10x5 seconds for 3 sets  Seated hip extension isometrics              L 10x5 seconds for 2 sets  Standing hip extension red band with B UE assist              R 30x with 5 second holds              L 30x with 5 second holds    Standing back extension 10x5 seconds for 2 sets       Improved exercise technique, movement at target  joints, use of target muscles after mod verbal, visual, tactile cues.  Response to treatment Nocomplain of pain throughout session  Clinical impression Pt continues to do well with decreased low back and L thigh pain with worst pain being reported as 3-4/10 for the past 7 days as well as decreased symptoms with picking up her infants. Continued working on decreasing lateral shift posture, as well as improving trunk and glute strength to decrease stress to low back. Pt will benefit from continued skilled physical therapy services to decrease pain, improve strength and function.       PT Short Term Goals - 09/25/20 1153      PT SHORT TERM GOAL #1   Title Pt will be independent with her initial HEP to decrease pain, improve strength, as well as ability to perform tasks which involve bending over more comfortably.    Baseline Pt started her HEP (09/25/2020)    Time 3    Period Weeks    Status New    Target Date 10/17/20             PT Long Term Goals - 09/25/20 1155      PT LONG TERM GOAL #1    Title Patient will have a decrease on low back pain to 4/10 or less at most to promote ability to take care of her twin infants, as well as perform tasks which involve bending over more comfortably.    Baseline 15/10 low back pain at most for the past 3 months (09/25/2020)    Time 8    Period Weeks    Status New    Target Date 11/21/20      PT LONG TERM GOAL #2   Title Patient will have a decrease in L thigh pain to 3/10 or less at worst to promote ability to perform tasks which involve bending over more comfortably.    Baseline 10/10 at most for the past 3 months (09/25/2020)    Time 8    Period Weeks    Status New    Target Date 11/21/20      PT LONG TERM GOAL #3   Title Patient will improve bilateral hip extension and abduction strength by at least 1/2 MMT grade to promote ability to perform standing tasks more comfortably.    Baseline Hip extension 3/5 R, 4-/5 L, 4/5 R 4+/5 L (09/25/2020)    Time 8    Period Weeks    Status New    Target Date 11/21/20      PT LONG TERM GOAL #4   Title Patient will improve her FOTO score  by at least 10 points as a demonstration of improved function.    Baseline Lumbar Spine FOTO 59 (09/25/2020)    Time 8    Period Weeks    Status New    Target Date 11/21/20                 Plan - 10/29/20 0919    Clinical Impression Statement Pt continues to do well with decreased low back and L thigh pain with worst pain being reported as 3-4/10 for the past 7 days as well as decreased symptoms with picking up her infants. Continued working on decreasing lateral shift posture, as well as improving trunk and glute strength to decrease stress to low back. Pt will benefit from continued skilled physical therapy services to decrease pain, improve strength and function.    Personal Factors and Comorbidities Comorbidity 2;Time since onset  of injury/illness/exacerbation;Fitness    Comorbidities Arthritis, cesarean section multi-gestational     Examination-Activity Limitations Transfers;Bend;Lift;Caring for Others    Stability/Clinical Decision Making Stable/Uncomplicated    Clinical Decision Making Low    Rehab Potential Fair    PT Frequency 2x / week    PT Duration 8 weeks    PT Treatment/Interventions Therapeutic activities;Therapeutic exercise;Neuromuscular re-education;Patient/family education;Manual techniques;Dry needling;Spinal Manipulations;Joint Manipulations;Aquatic Therapy;Electrical Stimulation;Iontophoresis 4mg /ml Dexamethasone;Traction;Ultrasound    PT Next Visit Plan posture, gentle extension, trunk and hip strengthening, lumbopelvic control, manual techniques, modalities PRN.    PT Home Exercise Plan no updates this date    Consulted and Agree with Plan of Care Patient           Patient will benefit from skilled therapeutic intervention in order to improve the following deficits and impairments:  Pain,Postural dysfunction,Improper body mechanics,Decreased strength  Visit Diagnosis: Chronic left-sided low back pain, unspecified whether sciatica present  Pain in left thigh     Problem List Patient Active Problem List   Diagnosis Date Noted  . COVID-19 vaccination declined 09/13/2020  . Chronic left-sided low back pain without sciatica 09/13/2020  . Meralgia paresthetica of left side 09/13/2020  . Cesarean wound infection 05/03/2020  . Twins, both liveborn   . Allergic rhinitis 02/23/2019    02/25/2019 PT, DPT   10/29/2020, 7:02 PM  [facility] [address], [address], [place] Phone: [phone]   Fax:  [phone]  Name: MARRA FRAGA MRN: Sherryl Barters Date of Birth: 1991/04/11

## 2020-10-31 ENCOUNTER — Other Ambulatory Visit: Payer: Self-pay

## 2020-10-31 ENCOUNTER — Ambulatory Visit: Payer: Medicaid Other

## 2020-10-31 ENCOUNTER — Encounter: Payer: Self-pay | Admitting: Family Medicine

## 2020-10-31 ENCOUNTER — Ambulatory Visit (INDEPENDENT_AMBULATORY_CARE_PROVIDER_SITE_OTHER): Payer: Medicaid Other | Admitting: Family Medicine

## 2020-10-31 VITALS — BP 116/76 | HR 91 | Temp 98.3°F | Ht 65.0 in | Wt 268.0 lb

## 2020-10-31 DIAGNOSIS — L600 Ingrowing nail: Secondary | ICD-10-CM | POA: Diagnosis not present

## 2020-10-31 NOTE — Progress Notes (Signed)
Established patient visit   Patient: Crystal Hansen   DOB: 1991-07-18   29 y.o. Female  MRN: [id number] Visit Date: 10/31/2020  Today's healthcare provider: Shirlee Latch, MD   Chief Complaint  Patient presents with  . Nail Problem    Pt said she has had ingrown toenail on right big toe. Pt says this is causing lots of pain when walking and says this is a repetitive problem.   Subjective    HPI HPI    Nail Problem     Additional comments: Pt said she has had ingrown toenail on right big toe. Pt says this is causing lots of pain when walking and says this is a repetitive problem.       Last edited by Paticia Stack, CMA on 10/31/2020  8:22 AM. (History)      Pt c/o of ingrown toenail in right big toe that was first noticed last year.  Social History   Tobacco Use  . Smoking status: Former Smoker    Packs/day: 0.50    Years: 13.00    Pack years: 6.50    Types: Cigarettes    Quit date: 09/09/2019    Years since quitting: 1.1  . Smokeless tobacco: Never Used  Vaping Use  . Vaping Use: Never used  Substance Use Topics  . Alcohol use: Not Currently    Comment: on occasion  . Drug use: Never       Medications: Outpatient Medications Prior to Visit  Medication Sig  . acetaminophen (TYLENOL) 500 MG tablet Take 2 tablets (1,000 mg total) by mouth every 6 (six) hours as needed for mild pain (or Fever >/= 101).  Marland Kitchen ibuprofen (ADVIL) 600 MG tablet Take 600 mg by mouth every 6 (six) hours as needed.  . meloxicam (MOBIC) 15 MG tablet Take 1 tablet (15 mg total) by mouth daily.   No facility-administered medications prior to visit.    Review of Systems  Constitutional: Negative.   Respiratory: Negative.   Cardiovascular: Negative.        Objective    BP 116/76 (BP Location: Left Arm, Patient Position: Sitting, Cuff Size: Large)   Pulse 91   Temp 98.3 F (36.8 C) (Oral)   Ht 5\' 5"  (1.651 m)   Wt 268 lb (121.6 kg)   BMI 44.60 kg/m     Physical  Exam Vitals reviewed.  Constitutional:      General: She is not in acute distress.    Appearance: She is well-developed.  HENT:     Head: Normocephalic and atraumatic.  Eyes:     General: No scleral icterus.    Conjunctiva/sclera: Conjunctivae normal.  Cardiovascular:     Rate and Rhythm: Normal rate and regular rhythm.  Pulmonary:     Effort: Pulmonary effort is normal. No respiratory distress.  Musculoskeletal:     Comments: Mild erythema and TTP along medial and lateral border of R great toenail. No fluctuance  Skin:    General: Skin is warm and dry.  Neurological:     Mental Status: She is alert and oriented to person, place, and time.  Psychiatric:        Behavior: Behavior normal.       No results found for any visits on 10/31/20.  Assessment & Plan     1. Ingrown toenail of right foot - recurrent issue - no signs of paronychia/infection at this time - advised soaking - no indication for abx at this time -  likely needs toenail removal - will refer to podiatry  - Ambulatory referral to Podiatry   Return if symptoms worsen or fail to improve.      I, Shirlee Latch, MD, have reviewed all documentation for this visit. The documentation on 10/31/20 for the exam, diagnosis, procedures, and orders are all accurate and complete.   Batool Majid, Marzella Schlein, MD, MPH [facility]

## 2020-10-31 NOTE — Patient Instructions (Addendum)
Ingrown Toenail An ingrown toenail occurs when the corner or sides of a toenail grow into the surrounding skin. This causes discomfort and pain. The big toe is most commonly affected, but any of the toes can be affected. If an ingrown toenail is nottreated, it can become infected. What are the causes? This condition may be caused by: Wearing shoes that are too small or tight. An injury, such as stubbing your toe or having your toe stepped on. Improper cutting or care of your toenails. Having nail or foot abnormalities that were present from birth (congenital abnormalities), such as having a nail that is too big for your toe. What increases the risk? The following factors may make you more likely to develop ingrown toenails: Age. Nails tend to get thicker with age, so ingrown nails are more common among older people. Cutting your toenails incorrectly, such as cutting them very short or cutting them unevenly. An ingrown toenail is more likely to get infected if you have: Diabetes. Blood flow (circulation) problems. What are the signs or symptoms? Symptoms of an ingrown toenail may include: Pain, soreness, or tenderness. Redness. Swelling. Hardening of the skin that surrounds the toenail. Signs that an ingrown toenail may be infected include: Fluid or pus. Symptoms that get worse instead of better. How is this diagnosed? An ingrown toenail may be diagnosed based on your medical history, your symptoms, and a physical exam. If you have fluid or blood coming from your toenail, a sample may be collected to test for the specific type of bacteriathat is causing the infection. How is this treated? Treatment depends on how severe your ingrown toenail is. You may be able to care for your toenail at home. If you have an infection, you may be prescribed antibiotic medicines. If you have fluid or pus draining from your toenail, your health care provider may drain it. If you have trouble walking, you  may be given crutches to use. If you have a severe or infected ingrown toenail, you may need a procedure to remove part or all of the nail. Follow these instructions at home: Foot care  Do not pick at your toenail or try to remove it yourself. Soak your foot in warm, soapy water. Do this for 20 minutes, 3 times a day, or as often as told by your health care provider. This helps to keep your toe clean and keep your skin soft. Wear shoes that fit well and are not too tight. Your health care provider may recommend that you wear open-toed shoes while you heal. Trim your toenails regularly and carefully. Cut your toenails straight across to prevent injury to the skin at the corners of the toenail. Do not cut your nails in a curved shape. Keep your feet clean and dry to help prevent infection.  Medicines Take over-the-counter and prescription medicines only as told by your health care provider. If you were prescribed an antibiotic, take it as told by your health care provider. Do not stop taking the antibiotic even if you start to feel better. Activity Return to your normal activities as told by your health care provider. Ask your health care provider what activities are safe for you. Avoid activities that cause pain. General instructions If your health care provider told you to use crutches to help you move around, use them as instructed. Keep all follow-up visits as told by your health care provider. This is important. Contact a health care provider if: You have more redness, swelling, pain, or   other symptoms that do not improve with treatment. You have fluid, blood, or pus coming from your toenail. Get help right away if: You have a red streak on your skin that starts at your foot and spreads up your leg. You have a fever. Summary An ingrown toenail occurs when the corner or sides of a toenail grow into the surrounding skin. This causes discomfort and pain. The big toe is most commonly  affected, but any of the toes can be affected. If an ingrown toenail is not treated, it can become infected. Fluid or pus draining from your toenail is a sign of infection. Your health care provider may need to drain it. You may be given antibiotics to treat the infection. Trimming your toenails regularly and properly can help you prevent an ingrown toenail. This information is not intended to replace advice given to you by your health care provider. Make sure you discuss any questions you have with your healthcare provider. Document Revised: 12/16/2018 Document Reviewed: 05/12/2017 Elsevier Patient Education  2021 [employer]  

## 2020-11-05 ENCOUNTER — Other Ambulatory Visit: Payer: Self-pay

## 2020-11-05 ENCOUNTER — Ambulatory Visit (INDEPENDENT_AMBULATORY_CARE_PROVIDER_SITE_OTHER): Payer: Medicaid Other | Admitting: Podiatry

## 2020-11-05 ENCOUNTER — Ambulatory Visit: Payer: Medicaid Other

## 2020-11-05 DIAGNOSIS — L600 Ingrowing nail: Secondary | ICD-10-CM | POA: Diagnosis not present

## 2020-11-05 MED ORDER — GENTAMICIN SULFATE 0.1 % EX CREA: 1.0000 "application " | TOPICAL_CREAM | Freq: Two times a day (BID) | CUTANEOUS | 0 refills | Status: DC

## 2020-11-05 MED ORDER — DOXYCYCLINE HYCLATE 100 MG PO TABS: 100.0000 mg | ORAL_TABLET | Freq: Two times a day (BID) | ORAL | 0 refills | Status: DC

## 2020-11-05 NOTE — Patient Instructions (Signed)

## 2020-11-05 NOTE — Progress Notes (Signed)
   Subjective: Patient presents today for evaluation of pain to the medial lateral border of the bilateral great toes. Patient is concerned for possible ingrown nail. Patient presents today for further treatment and evaluation.  Past Medical History:  Diagnosis Date  . Allergy   . Anxiety   . Arthritis   . Headache     Objective:  General: Well developed, nourished, in no acute distress, alert and oriented x3   Dermatology: Skin is warm, dry and supple bilateral.  Bilateral great toes appears to be erythematous with evidence of an ingrowing nail. Pain on palpation noted to the border of the nail fold. The remaining nails appear unremarkable at this time. There are no open sores, lesions.  Vascular: Dorsalis Pedis artery and Posterior Tibial artery pedal pulses palpable. No lower extremity edema noted.   Neruologic: Grossly intact via light touch bilateral.  Musculoskeletal: Muscular strength within normal limits in all groups bilateral. Normal range of motion noted to all pedal and ankle joints.   Assesement: #1 Paronychia with ingrowing nail medial and lateral border bilateral great toes #2 Pain in toe #3 Incurvated nail  Plan of Care:  1. Patient evaluated.  2. Discussed treatment alternatives and plan of care. Explained nail avulsion procedure and post procedure course to patient. 3. Patient opted for permanent partial nail avulsion of the medial lateral border bilateral great toes.  4. Prior to procedure, local anesthesia infiltration utilized using 3 ml of a 50:50 mixture of 2% plain lidocaine and 0.5% plain marcaine in a normal hallux block fashion and a betadine prep performed.  5. Partial permanent nail avulsion with chemical matrixectomy performed using 3x30sec applications of phenol followed by alcohol flush.  6. Light dressing applied. 7.  Prescription for doxycycline 100 mg 2 times daily #20  8.  Prescription for gentamicin cream applied daily  9.  Return to clinic 2  weeks.  Felecia Shelling, DPM [facility]  Dr. Felecia Shelling, DPM    [address]                                        [place], [place] [address]                Office [phone]  Fax [phone]

## 2020-11-07 ENCOUNTER — Ambulatory Visit: Payer: Medicaid Other | Attending: Family Medicine

## 2020-11-07 ENCOUNTER — Other Ambulatory Visit: Payer: Self-pay

## 2020-11-07 DIAGNOSIS — M79652 Pain in left thigh: Secondary | ICD-10-CM | POA: Diagnosis present

## 2020-11-07 DIAGNOSIS — G8929 Other chronic pain: Secondary | ICD-10-CM | POA: Diagnosis present

## 2020-11-07 DIAGNOSIS — M545 Low back pain, unspecified: Secondary | ICD-10-CM | POA: Insufficient documentation

## 2020-11-07 NOTE — Therapy (Signed)
[facility] PH[facility] MEDICINE [address] [place], [address] Phone: [phone]   Fax:  [phone]  Physical Therapy Treatment And Discharge Summary  Patient Details  Name: Crystal [place] MRN: [id number] Date of Birth: 1991-05-23 Referring Provider (PT): Shirlee Latch, MD   Encounter Date: 11/07/2020   PT End of Session - 11/07/20 1518    Visit Number 7    Number of Visits 17    Date for PT Re-Evaluation 11/21/20    PT Start Time 1518    PT Stop Time 1556    PT Time Calculation (min) 38 min    Activity Tolerance Patient tolerated treatment well;No increased pain    Behavior During Therapy WFL for tasks assessed/performed           Past Medical History:  Diagnosis Date  . Allergy   . Anxiety   . Arthritis   . Headache     Past Surgical History:  Procedure Laterality Date  . APPENDECTOMY    . CESAREAN SECTION MULTI-GESTATIONAL  04/16/2020   Procedure: CESAREAN SECTION MULTI-GESTATIONAL;  Surgeon: Natale Milch, MD;  Location: [facility] ORS;  Service: Obstetrics;;  . WISDOM TOOTH EXTRACTION      There were no vitals filed for this visit.   Subjective Assessment - 11/07/20 1520    Subjective Could not make it to her last appointment because her ingrown toe nails were removed. Back is fantastic. 3/10 back most for the past 7 days and Tylenol takes care of it. Pain increases to 3/10 first thing in the morning or after a day of doing lots of chores. L lateral thigh is doing great. 3/10 L lateral thigh at most for the past 7 days. Feels like she can graduate PT today.    Pertinent History Low back pain. Pain began 5 months ago on 04/16/2020 when pt gave birth to twins via C-section. L LE pain occurred first, followed by back pain about 3 months later. No loss of bowel or bladder control (other than from pregnancy). Denies saddle anesthesia.    Currently in Pain? No/denies    Pain Score 0-No pain                                      PT Education - 11/07/20 1522    Education Details ther-ex    Person(s) Educated Patient    Methods Explanation;Demonstration;Tactile cues;Verbal cues    Comprehension Returned demonstration;Verbalized understanding           Objective  [email or web address]   No latex allergies   MedbridgeAccess Code [email or web address]  Therapeutic exercise  Seated manually resisted L lateral shift isometrics 10x5 seconds for 3 sets  Seated manually resisted trunk flexion isometrics in neutral with PT manual resitsance   Seated manually resisted R upper trunk rotation isometrics in neutral to decreased R lumbar rotation posture 10x3 with 5 second holds   Seated hip extension isometrics (pressing down with heels to avoid toes)  L 10x5 seconds for 2 sets  Seated Paloff press red double band             R 10x5 seconds for 3 sets             L 10x5 seconds for 3 sets  Seated B scapular retraction red band 20x5 seconds (easy; upgrade next time)   Seated B shoulder extension isometrics 10x5 seconds for 3  sets   Manually resisted hip abduction S/L, prone hip extension 1-2x each way for each LE  Hip extension 4+/5 R and L, hip abduction 5/5 R and L (11/07/2020)  Reviewed progress with hip strength with pt.     Improved exercise technique, movement at target joints, use of target muscles after mod verbal, visual, tactile cues.  Response to treatment Nocomplain of pain throughout session  Clinical impression Pt has made very good progress with PT towards goals with worst back and L thigh pain decreasing to 3/10 for both. Pt also demonstrates improved bilateral hip extension and abduction strength and function since initial evaluation. Skilled physical therapy services discharged with pt continuing progress with her exercises at home.        PT Short Term Goals - 11/07/20 1821      PT SHORT TERM GOAL #1    Title Pt will be independent with her initial HEP to decrease pain, improve strength, as well as ability to perform tasks which involve bending over more comfortably.    Baseline Pt started her HEP (09/25/2020); Pt performing her HEP, no questions (11/07/2020)    Time 3    Period Weeks    Status Achieved    Target Date 10/17/20             PT Long Term Goals - 11/07/20 1536      PT LONG TERM GOAL #1   Title Patient will have a decrease on low back pain to 4/10 or less at most to promote ability to take care of her twin infants, as well as perform tasks which involve bending over more comfortably.    Baseline 15/10 low back pain at most for the past 3 months (09/25/2020); 3/10 low back pain at most for the past 7 days (11/07/2020)    Time 8    Period Weeks    Status Achieved    Target Date 11/21/20      PT LONG TERM GOAL #2   Title Patient will have a decrease in L thigh pain to 3/10 or less at worst to promote ability to perform tasks which involve bending over more comfortably.    Baseline 10/10 at most for the past 3 months (09/25/2020); 3/10 L thigh pain at most for the past 7 days (11/07/2020)    Time 8    Period Weeks    Status Achieved    Target Date 11/21/20      PT LONG TERM GOAL #3   Title Patient will improve bilateral hip extension and abduction strength by at least 1/2 MMT grade to promote ability to perform standing tasks more comfortably.    Baseline Hip extension 3/5 R, 4-/5 L, 4/5 R 4+/5 L (09/25/2020); Hip extension 4+/5 R and L, hip abduction 5/5 R and L (11/07/2020)    Time 8    Period Weeks    Status Achieved    Target Date 11/21/20      PT LONG TERM GOAL #4   Title Patient will improve her FOTO score  by at least 10 points as a demonstration of improved function.    Baseline Lumbar Spine FOTO 59 (09/25/2020); 94 (10/09/2020); (11/07/2020)    Time 8    Period Weeks    Status Achieved    Target Date 11/21/20                 Plan - 11/07/20 1529    Clinical  Impression Statement Pt has made very good progress  with PT towards goals with worst back and L thigh pain decreasing to 3/10 for both. Pt also demonstrates improved bilateral hip extension and abduction strength and function since initial evaluation. Skilled physical therapy services discharged with pt continuing progress with her exercises at home.    Personal Factors and Comorbidities Comorbidity 2;Time since onset of injury/illness/exacerbation;Fitness    Comorbidities Arthritis, cesarean section multi-gestational    Examination-Activity Limitations Transfers;Bend;Lift;Caring for Others    Stability/Clinical Decision Making Stable/Uncomplicated    Clinical Decision Making Low    Rehab Potential --    PT Frequency --    PT Duration --    PT Treatment/Interventions Therapeutic activities;Therapeutic exercise;Neuromuscular re-education;Patient/family education;Manual techniques    PT Next Visit Plan Continue progress with her home exercise program    PT Home Exercise Plan Medbridge Access Code [email or web address]    Consulted and Agree with Plan of Care Patient           Patient will benefit from skilled therapeutic intervention in order to improve the following deficits and impairments:  Pain,Postural dysfunction,Improper body mechanics,Decreased strength  Visit Diagnosis: Chronic left-sided low back pain, unspecified whether sciatica present  Pain in left thigh     Problem List Patient Active Problem List   Diagnosis Date Noted  . COVID-19 vaccination declined 09/13/2020  . Chronic left-sided low back pain without sciatica 09/13/2020  . Meralgia paresthetica of left side 09/13/2020  . Cesarean wound infection 05/03/2020  . Twins, both liveborn   . Allergic rhinitis 02/23/2019  . Tobacco use disorder 02/15/2019  . Marijuana use 12/10/2012  . Supervision of normal first pregnancy 12/10/2012  . Tobacco use complicating pregnancy 12/10/2012    Thank you for your referral.  Loralyn Freshwater PT, DPT   11/07/2020, 6:25 PM  [facility] PHYSICAL AND SPORTS MEDICINE [address] [place], [place], [address] Phone: [phone]   Fax:  [phone]  Name: Crystal [place] MRN: [id number] Date of Birth: 1991/01/28

## 2020-11-19 ENCOUNTER — Other Ambulatory Visit: Payer: Self-pay

## 2020-11-19 ENCOUNTER — Ambulatory Visit (INDEPENDENT_AMBULATORY_CARE_PROVIDER_SITE_OTHER): Payer: Medicaid Other | Admitting: Podiatry

## 2020-11-19 DIAGNOSIS — L600 Ingrowing nail: Secondary | ICD-10-CM

## 2020-12-03 NOTE — Progress Notes (Signed)
   Subjective: 30 y.o. female presents today status post permanent nail avulsion procedure of the medial lateral border of the bilateral great toes that was performed on 11/05/2020.  Overall the patient states that she feels much better.  She took the oral doxycycline as prescribed.  She is also been soaking her foot and applying the gentamicin cream.  No new complaints at this time  Past Medical History:  Diagnosis Date  . Allergy   . Anxiety   . Arthritis   . Headache     Objective: Skin is warm, dry and supple. Nail and respective nail fold appears to be healing appropriately. Open wound to the associated nail fold with a granular wound base and moderate amount of fibrotic tissue. Minimal drainage noted. Mild erythema around the periungual region likely due to phenol chemical matricectomy.  Assessment: #1 postop permanent partial nail avulsion medial and lateral border bilateral great toes #2 open wound periungual nail fold of respective digit.   Plan of care: #1 patient was evaluated  #2 debridement of open wound was performed to the periungual border of the respective toe using a currette. Antibiotic ointment and Band-Aid was applied. #3 patient is to return to clinic on a PRN basis.   Felecia Shelling, DPM [facility]  Dr. Felecia Shelling, DPM    [address] [place], [place] [address]                Office [phone]  Fax [phone]

## 2020-12-19 ENCOUNTER — Encounter: Payer: Self-pay | Admitting: Obstetrics

## 2020-12-19 ENCOUNTER — Ambulatory Visit (INDEPENDENT_AMBULATORY_CARE_PROVIDER_SITE_OTHER): Payer: Medicaid Other | Admitting: Obstetrics

## 2020-12-19 ENCOUNTER — Other Ambulatory Visit: Payer: Self-pay

## 2020-12-19 VITALS — BP 120/70 | Temp 98.1°F | Ht 65.5 in | Wt 269.0 lb

## 2020-12-19 DIAGNOSIS — Z975 Presence of (intrauterine) contraceptive device: Secondary | ICD-10-CM | POA: Diagnosis not present

## 2020-12-19 DIAGNOSIS — N921 Excessive and frequent menstruation with irregular cycle: Secondary | ICD-10-CM

## 2020-12-19 DIAGNOSIS — R102 Pelvic and perineal pain: Secondary | ICD-10-CM | POA: Diagnosis not present

## 2020-12-19 LAB — POCT URINE PREGNANCY: Preg Test, Ur: NEGATIVE

## 2020-12-20 LAB — CBC WITH DIFFERENTIAL/PLATELET
Basophils Absolute: 0.1 10*3/uL (ref 0.0–0.2)
Basos: 1 %
EOS (ABSOLUTE): 0.5 10*3/uL — ABNORMAL HIGH (ref 0.0–0.4)
Eos: 3 %
Hematocrit: 40.8 % (ref 34.0–46.6)
Hemoglobin: 13.1 g/dL (ref 11.1–15.9)
Immature Grans (Abs): 0.1 10*3/uL (ref 0.0–0.1)
Immature Granulocytes: 1 %
Lymphocytes Absolute: 3.7 10*3/uL — ABNORMAL HIGH (ref 0.7–3.1)
Lymphs: 23 %
MCH: 25.4 pg — ABNORMAL LOW (ref 26.6–33.0)
MCHC: 32.1 g/dL (ref 31.5–35.7)
MCV: 79 fL (ref 79–97)
Monocytes Absolute: 0.7 10*3/uL (ref 0.1–0.9)
Monocytes: 5 %
Neutrophils Absolute: 10.8 10*3/uL — ABNORMAL HIGH (ref 1.4–7.0)
Neutrophils: 67 %
Platelets: 433 10*3/uL (ref 150–450)
RBC: 5.16 x10E6/uL (ref 3.77–5.28)
RDW: 14.5 % (ref 11.7–15.4)
WBC: 15.8 10*3/uL — ABNORMAL HIGH (ref 3.4–10.8)

## 2020-12-23 LAB — POCT URINALYSIS DIPSTICK
Bilirubin, UA: NEGATIVE
Blood, UA: NEGATIVE
Glucose, UA: NEGATIVE
Ketones, UA: NEGATIVE
Leukocytes, UA: NEGATIVE
Nitrite, UA: NEGATIVE
Protein, UA: POSITIVE — AB
Spec Grav, UA: >=1.03 — AB (ref 1.010–1.025)
Urobilinogen, UA: 0.2 E.U./dL
pH, UA: 5 (ref 5.0–8.0)

## 2020-12-24 NOTE — Progress Notes (Signed)
Obstetrics & Gynecology Office Visit   Chief Complaint:  Chief Complaint  Patient presents with  . Vaginal Discharge    No odor, itchiness or irritation x 2-3 days  . Pelvic Pain    Entire area since yesterday  . Vaginal Bleeding    Since 12/06/20, normal flow, no abnormal pain, pt would like UPT    History of Present Illness: Crystal [place] presents today with c/o vaginal discharge and some intermittant pelvoic pain that she noticed two days ago. She describes her discharge as milky and white with some spotting. She c/o irregular spotting now that she is using the Nexplanon for contraception. Her hx is is significant to for a CS delivery last Fall, with some incisional "oozing" that required additional f/u and treatment with antibiotics. She brings this up during our interview, but denies any present incisional complaints. She is contracepting with the Nexplanon, and shares some anxiety that she might be pregnant.   Review of Systems:  Review of Systems  Constitutional: Negative.   HENT: Negative.   Eyes: Negative.   Respiratory: Negative.   Gastrointestinal: Negative.   Genitourinary: Negative.        Reports some vaginal discharge  Musculoskeletal: Negative.   Skin: Negative.   Neurological: Negative.   Endo/Heme/Allergies: Negative.   Psychiatric/Behavioral: Negative.      Past Medical History:  Past Medical History:  Diagnosis Date  . Allergy   . Anxiety   . Arthritis   . Headache     Past Surgical History:  Past Surgical History:  Procedure Laterality Date  . APPENDECTOMY    . CESAREAN SECTION MULTI-GESTATIONAL  04/16/2020   Procedure: CESAREAN SECTION MULTI-GESTATIONAL;  Surgeon: Natale Milch, MD;  Location: [facility] ORS;  Service: Obstetrics;;  . WISDOM TOOTH EXTRACTION      Gynecologic History: No LMP recorded. Patient has had an implant.  Obstetric History: G2P2003  Family History:  Family History  Problem Relation Age of Onset  . Eczema Father   .  Cancer Maternal Grandmother        Mouth Cancer   . Heart disease Maternal Grandfather   . Diabetes Maternal Grandfather   . Cataracts Maternal Grandfather   . Anemia Paternal Grandmother   . Dementia Paternal Grandmother   . Colon cancer Paternal Grandfather   . Diabetes Mother   . Neuropathy Mother   . Breast cancer Neg Hx     Social History:  Social History   Socioeconomic History  . Marital status: Divorced    Spouse name: Not on file  . Number of children: 3  . Years of education: Not on file  . Highest education level: Not on file  Occupational History  . Occupation: stay at home mom  Tobacco Use  . Smoking status: Former Smoker    Packs/day: 0.50    Years: 13.00    Pack years: 6.50    Types: Cigarettes    Quit date: 09/09/2019    Years since quitting: 1.2  . Smokeless tobacco: Never Used  Vaping Use  . Vaping Use: Never used  Substance and Sexual Activity  . Alcohol use: Not Currently    Comment: on occasion  . Drug use: Never  . Sexual activity: Yes    Partners: Male    Birth control/protection: Implant  Other Topics Concern  . Not on file  Social History Narrative   Recently had twins    Right Handed   Lives in a one story hom e  Social Determinants of Health   Financial Resource Strain: Not on file  Food Insecurity: Not on file  Transportation Needs: Not on file  Physical Activity: Not on file  Stress: Not on file  Social Connections: Not on file  Intimate Partner Violence: Not on file    Allergies:  Allergies  Allergen Reactions  . Peanut Oil Anaphylaxis  . Shellfish Allergy Anaphylaxis  . Amoxapine And Related   . Apple Other (See Comments)  . Amoxicillin Rash    Medications: Prior to Admission medications   Medication Sig Start Date End Date Taking? Authorizing Provider  etonogestrel (NEXPLANON) 68 MG IMPL implant 1 each by Subdermal route once. 05/31/20  Yes [provider]    Physical Exam Vitals:  Vitals:   12/19/20  1539  BP: 120/70  Temp: 98.1 F (36.7 C)   No LMP recorded. Patient has had an implant.  Physical Exam Constitutional:      Appearance: Normal appearance. She is obese.  HENT:     Head: Normocephalic and atraumatic.     Nose: Nose normal.  Cardiovascular:     Rate and Rhythm: Normal rate and regular rhythm.     Pulses: Normal pulses.     Heart sounds: Normal heart sounds.  Pulmonary:     Effort: Pulmonary effort is normal.  Abdominal:     Palpations: Abdomen is soft.  Genitourinary:    General: Normal vulva.     Comments: No rashes or external lesions noted. Scant vaginal discharge noted . Difficult bimanual secondary to high BMI. No pelvic tenderness noted- uterus is anteverted. Musculoskeletal:        General: Normal range of motion.     Cervical back: Normal range of motion and neck supple.  Skin:    General: Skin is warm and dry.  Neurological:     General: No focal deficit present.     Mental Status: She is alert and oriented to person, place, and time.  Psychiatric:        Mood and Affect: Mood normal.        Behavior: Behavior normal.      Assessment: 30 y.o. G2P2003 vaginal discharge and occasional pelvic pain. Irregular spotting with the Nexplanon.   Plan: Problem List Items Addressed This Visit   None   Visit Diagnoses    Breakthrough bleeding on Nexplanon    -  Primary   Relevant Orders   POCT urine pregnancy (Completed)   POCT Urinalysis Dipstick (Completed)   Pelvic pain       Relevant Orders   CBC w/Diff/Platelet (Completed)   POCT Urinalysis Dipstick (Completed)   Acute suprapubic pain       Relevant Orders   Urine Culture   POCT Urinalysis Dipstick (Completed)    Pregnancy test is negative today, and this is reassuring to her. Urine sent for culture today. CBC drawn to evaluate for possible infection, although this is unlikely. Will follow up based on lab results. She is offered follow up with an MD to discuss her pelvic pain concerns  should they continue.  Mirna Mires, CNM  12/24/2020 9:12 AM

## 2020-12-25 LAB — URINE CULTURE

## 2020-12-26 ENCOUNTER — Other Ambulatory Visit: Payer: Self-pay | Admitting: Obstetrics

## 2020-12-26 DIAGNOSIS — R8271 Bacteriuria: Secondary | ICD-10-CM

## 2020-12-26 MED ORDER — NITROFURANTOIN MONOHYD MACRO 100 MG PO CAPS: 100.0000 mg | ORAL_CAPSULE | Freq: Two times a day (BID) | ORAL | 0 refills | Status: AC

## 2020-12-26 NOTE — Progress Notes (Signed)
Spoke with patient by phone, and shared urine culture results. Will treat with Macorbid. Advised a "front -to-back" practec after using the bathroom.  Mirna Mires, CNM  12/26/2020 10:43 AM

## 2021-03-24 ENCOUNTER — Other Ambulatory Visit: Payer: Self-pay

## 2021-03-24 ENCOUNTER — Encounter: Payer: Self-pay | Admitting: Family Medicine

## 2021-03-24 ENCOUNTER — Ambulatory Visit (INDEPENDENT_AMBULATORY_CARE_PROVIDER_SITE_OTHER): Payer: Medicaid Other | Admitting: Family Medicine

## 2021-03-24 VITALS — BP 114/80 | HR 100 | Temp 97.9°F | Ht 65.5 in | Wt 263.7 lb

## 2021-03-24 DIAGNOSIS — E669 Obesity, unspecified: Secondary | ICD-10-CM | POA: Insufficient documentation

## 2021-03-24 DIAGNOSIS — N393 Stress incontinence (female) (male): Secondary | ICD-10-CM | POA: Diagnosis not present

## 2021-03-24 DIAGNOSIS — Z1159 Encounter for screening for other viral diseases: Secondary | ICD-10-CM | POA: Diagnosis not present

## 2021-03-24 DIAGNOSIS — R350 Frequency of micturition: Secondary | ICD-10-CM | POA: Diagnosis not present

## 2021-03-24 DIAGNOSIS — Z0000 Encounter for general adult medical examination without abnormal findings: Secondary | ICD-10-CM

## 2021-03-24 DIAGNOSIS — F4323 Adjustment disorder with mixed anxiety and depressed mood: Secondary | ICD-10-CM | POA: Insufficient documentation

## 2021-03-24 LAB — POCT URINALYSIS DIPSTICK
Bilirubin, UA: NEGATIVE
Glucose, UA: NEGATIVE
Ketones, UA: NEGATIVE
Leukocytes, UA: NEGATIVE
Nitrite, UA: NEGATIVE
Protein, UA: POSITIVE — AB
Spec Grav, UA: 1.02 (ref 1.010–1.025)
Urobilinogen, UA: NEGATIVE E.U./dL — AB
pH, UA: 6 (ref 5.0–8.0)

## 2021-03-24 MED ORDER — SERTRALINE HCL 50 MG PO TABS: 50.0000 mg | ORAL_TABLET | Freq: Every day | ORAL | 3 refills | Status: DC

## 2021-03-24 NOTE — Progress Notes (Signed)
Complete physical exam   Patient: Crystal Hansen   DOB: [birth year]   30 y.o. Female  MRN: [id number] Visit Date: 03/24/2021   Today's healthcare provider: Shirlee Latch, MD   Chief Complaint  Patient presents with   Annual Exam   Subjective    Crystal Hansen is a 30 y.o. female who presents today for a complete physical exam.  She reports consuming a general diet. Home exercise routine includes walking one half hrs per days. She generally feels well. She reports sleeping poorly. She does have additional problems to discuss today.   HPI  Urinary Frequency  She states that she has always experienced urgency but her symptoms have exacerbated within the last two months.   Tick Bite She states that she is experiencing an itching sensation located in the area that she recently experienced a tick bite. She has been using Cortizone cream to treat her symptoms.  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   Past Medical History:  Diagnosis Date   Allergy    Anxiety    Arthritis    Cesarean wound infection 05/03/2020   Headache    Past Surgical History:  Procedure Laterality Date   APPENDECTOMY     CESAREAN SECTION MULTI-GESTATIONAL  04/16/2020   Procedure: CESAREAN SECTION MULTI-GESTATIONAL;  Surgeon: Natale Milch, MD;  Location: [facility] ORS;  Service: Obstetrics;;   WISDOM TOOTH EXTRACTION     Social History   Socioeconomic History   Marital status: Divorced    Spouse name: Not on file   Number of children: 3   Years of education: Not on file   Highest education level: Not on file  Occupational History   Occupation: stay at home mom  Tobacco Use   Smoking status: Former    Packs/day: 0.50    Years: 13.00    Pack years: 6.50    Types: Cigarettes    Quit date: 09/09/2019    Years since quitting: 1.5   Smokeless tobacco: Never  Vaping Use   Vaping Use: Never  used  Substance and Sexual Activity   Alcohol use: Not Currently    Comment: on occasion   Drug use: Yes    Types: Marijuana   Sexual activity: Yes    Partners: Male    Birth control/protection: Implant  Other Topics Concern   Not on file  Social History Narrative   Recently had twins    Right Handed   Lives in a one story hom e   Social Determinants of Health   Financial Resource Strain: Not on file  Food Insecurity: Not on file  Transportation Needs: Not on file  Physical Activity: Not on file  Stress: Not on file  Social Connections: Not on file  Intimate Partner Violence: Not on file   Family Status  Relation Name Status   Mother  Alive   Father  (Not Specified)   MGM  Deceased   MGF  Deceased   PGM  (Not Specified)   PGF  Deceased   Neg Hx  (Not Specified)   Family History  Problem Relation Age of Onset   Diabetes Mother    Neuropathy Mother    Kidney disease Mother    Heart disease Mother    Cataracts Mother    Eczema Father    Cancer Maternal Grandmother        Mouth Cancer  Heart disease Maternal Grandfather    Diabetes Maternal Grandfather    Cataracts Maternal Grandfather    Anemia Paternal Grandmother    Dementia Paternal Grandmother    Colon cancer Paternal Grandfather    Breast cancer Neg Hx    Allergies  Allergen Reactions   Peanut Oil Anaphylaxis   Shellfish Allergy Anaphylaxis   Amoxapine And Related    Apple Other (See Comments)   Amoxicillin Rash    Patient Care Team: Crystal Hansen, Letticia Bhattacharyya M, MD as PCP - General (Family Medicine)   Medications: Outpatient Medications Prior to Visit  Medication Sig   etonogestrel (NEXPLANON) 68 MG IMPL implant 1 each by Subdermal route once.   No facility-administered medications prior to visit.    Review of Systems  Constitutional:  Negative for chills, fatigue and fever.  HENT:  Negative for congestion, ear pain, rhinorrhea, sinus pain and sore throat.   Respiratory:  Negative for cough,  shortness of breath and wheezing.   Cardiovascular:  Negative for chest pain and leg swelling.  Gastrointestinal:  Negative for abdominal pain, blood in stool, diarrhea, nausea and vomiting.  Genitourinary:  Positive for urgency. Negative for dysuria, flank pain and frequency.  Neurological:  Negative for dizziness and headaches.   @VANISHINGLABLINKS @ Objective     Today's Vitals   03/24/21 1043  BP: 114/80  Pulse: 100  Temp: 97.9 F (36.6 C)  TempSrc: Oral  SpO2: 99%  Weight: 263 lb 11.2 oz (119.6 kg)  Height: 5' 5.5" (1.664 m)   Body mass index is 43.21 kg/m.      Physical Exam Vitals reviewed.  Constitutional:      General: She is not in acute distress.    Appearance: Normal appearance. She is well-developed. She is not diaphoretic.  HENT:     Head: Normocephalic and atraumatic.     Right Ear: Tympanic membrane, ear canal and external ear normal.     Left Ear: Tympanic membrane, ear canal and external ear normal.     Mouth/Throat:     Pharynx: No oropharyngeal exudate.  Eyes:     General: No scleral icterus.    Conjunctiva/sclera: Conjunctivae normal.     Pupils: Pupils are equal, round, and reactive to light.  Neck:     Thyroid: No thyromegaly.  Cardiovascular:     Rate and Rhythm: Normal rate and regular rhythm.     Pulses: Normal pulses.     Heart sounds: Normal heart sounds. No murmur heard. Pulmonary:     Effort: Pulmonary effort is normal. No respiratory distress.     Breath sounds: Normal breath sounds. No wheezing or rales.  Abdominal:     General: There is no distension.     Palpations: Abdomen is soft.     Tenderness: There is no abdominal tenderness.  Musculoskeletal:        General: No deformity.     Cervical back: Neck supple.     Right lower leg: No edema.     Left lower leg: No edema.  Lymphadenopathy:     Cervical: No cervical adenopathy.  Skin:    General: Skin is warm and dry.     Findings: No rash.  Neurological:     Mental Status:  She is alert and oriented to person, place, and time. Mental status is at baseline.     Sensory: No sensory deficit.     Motor: No weakness.     Gait: Gait normal.  Psychiatric:  Mood and Affect: Mood normal.        Behavior: Behavior normal.        Thought Content: Thought content normal.     Last depression screening scores PHQ 2/9 Scores 10/31/2020 09/09/2019 02/20/2019  PHQ - 2 Score 0 1 2  PHQ- 9 Score 4 - 11   Last fall risk screening Fall Risk  10/31/2020  Falls in the past year? 0  Number falls in past yr: 0  Injury with Fall? 0   Last Audit-C alcohol use screening Alcohol Use Disorder Test (AUDIT) 10/31/2020  1. How often do you have a drink containing alcohol? 1  2. How many drinks containing alcohol do you have on a typical day when you are drinking? 2  3. How often do you have six or more drinks on one occasion? 1  AUDIT-C Score 4  4. How often during the last year have you found that you were not able to stop drinking once you had started? 0  5. How often during the last year have you failed to do what was normally expected from you because of drinking? 0  6. How often during the last year have you needed a first drink in the morning to get yourself going after a heavy drinking session? 0  7. How often during the last year have you had a feeling of guilt of remorse after drinking? 0  8. How often during the last year have you been unable to remember what happened the night before because you had been drinking? 0  9. Have you or someone else been injured as a result of your drinking? 0  10. Has a relative or friend or a doctor or another health worker been concerned about your drinking or suggested you cut down? 0  Alcohol Use Disorder Identification Test Final Score (AUDIT) 4  Alcohol Brief Interventions/Follow-up AUDIT Score <7 follow-up not indicated   A score of 3 or more in women, and 4 or more in men indicates increased risk for alcohol abuse, EXCEPT if all of  the points are from question 1   No results found for any visits on 03/24/21.  Assessment & Plan    Routine Health Maintenance and Physical Exam  Exercise Activities and Dietary recommendations  Goals   None     Immunization History  Administered Date(s) Administered   Tdap 08/07/2012, 02/20/2020    Health Maintenance  Topic Date Due   COVID-19 Vaccine (1) Never done   Pneumococcal Vaccine 68-108 Years old (1 - PCV) Never done   Hepatitis C Screening  Never done   INFLUENZA VACCINE  04/07/2021   PAP-Cervical Cytology Screening  10/23/2022   PAP SMEAR-Modifier  10/23/2022   TETANUS/TDAP  02/19/2030   HIV Screening  Completed   HPV VACCINES  Aged Out    Discussed health benefits of physical activity, and encouraged her to engage in regular exercise appropriate for her age and condition.   Problem List Items Addressed This Visit       Other   Morbid obesity (HCC)    Discussed importance of healthy weight management Discussed diet and exercise        Relevant Orders   Lipid panel   Comprehensive metabolic panel   SUI (stress urinary incontinence, female)    New since delivery of her twins No urge incontinence Will refer to pelvic floor PT       Relevant Orders   Ambulatory referral to Physical Therapy   Urinary  frequency    UA clean Likely related to pelvic floor weakness as above       Relevant Orders   POCT Urinalysis Dipstick   Adjustment disorder with mixed anxiety and depressed mood    New problem 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 Encourage therapy Will Start Zoloft 50 mg daily Discussed potential side effects, incl GI upset, sexual dysfunction, increased anxiety, and SI Discussed that it can take 6-8 weeks to reach full efficacy Contracted for safety - no SI/HI Discussed synergistic effects of medications and therapy        Other Visit Diagnoses     Encounter for annual physical exam    -  Primary   Relevant Orders    Hepatitis C Antibody   Lipid panel   Comprehensive metabolic panel   Need for hepatitis C screening test       Relevant Orders   Hepatitis C Antibody        Return in about 3 months (around 06/24/2021) for MDD/GAD f/u, virtual ok.     Danelle Earthly [place],acting as a Neurosurgeon for Shirlee Latch, MD.,have documented all relevant documentation on the behalf of Shirlee Latch, MD,as directed by  Shirlee Latch, MD while in the presence of Shirlee Latch, MD.    I, Shirlee Latch, MD, have reviewed all documentation for this visit. The documentation on 03/24/21 for the exam, diagnosis, procedures, and orders are all accurate and complete.   Franci Oshana, Marzella Schlein, MD, MPH [facility]

## 2021-03-24 NOTE — Patient Instructions (Signed)
Preventive Care 21-29 Years Old, Female Preventive care refers to lifestyle choices and visits with your health care provider that can promote health and wellness. This includes: A yearly physical exam. This is also called an annual wellness visit. Regular dental and eye exams. Immunizations. Screening for certain conditions. Healthy lifestyle choices, such as: Eating a healthy diet. Getting regular exercise. Not using drugs or products that contain nicotine and tobacco. Limiting alcohol use. What can I expect for my preventive care visit? Physical exam Your health care provider may check your: Height and weight. These may be used to calculate your BMI (body mass index). BMI is a measurement that tells if you are at a healthy weight. Heart rate and blood pressure. Body temperature. Skin for abnormal spots. Counseling Your health care provider may ask you questions about your: Past medical problems. Family's medical history. Alcohol, tobacco, and drug use. Emotional well-being. Home life and relationship well-being. Sexual activity. Diet, exercise, and sleep habits. Work and work environment. Access to firearms. Method of birth control. Menstrual cycle. Pregnancy history. What immunizations do I need?  Vaccines are usually given at various ages, according to a schedule. Your health care provider will recommend vaccines for you based on your age, medicalhistory, and lifestyle or other factors, such as travel or where you work. What tests do I need?  Blood tests Lipid and cholesterol levels. These may be checked every 5 years starting at age 20. Hepatitis C test. Hepatitis B test. Screening Diabetes screening. This is done by checking your blood sugar (glucose) after you have not eaten for a while (fasting). STD (sexually transmitted disease) testing, if you are at risk. BRCA-related cancer screening. This may be done if you have a family history of breast, ovarian, tubal, or  peritoneal cancers. Pelvic exam and Pap test. This may be done every 3 years starting at age 21. Starting at age 30, this may be done every 5 years if you have a Pap test in combination with an HPV test. Talk with your health care provider about your test results, treatment options,and if necessary, the need for more tests. Follow these instructions at home: Eating and drinking  Eat a healthy diet that includes fresh fruits and vegetables, whole grains, lean protein, and low-fat dairy products. Take vitamin and mineral supplements as recommended by your health care provider. Do not drink alcohol if: Your health care provider tells you not to drink. You are pregnant, may be pregnant, or are planning to become pregnant. If you drink alcohol: Limit how much you have to 0-1 drink a day. Be aware of how much alcohol is in your drink. In the [place] one drink equals one 12 oz bottle of beer (355 mL), one 5 oz glass of wine (148 mL), or one 1 oz glass of hard liquor (44 mL).  Lifestyle Take daily care of your teeth and gums. Brush your teeth every morning and night with fluoride toothpaste. Floss one time each day. Stay active. Exercise for at least 30 minutes 5 or more days each week. Do not use any products that contain nicotine or tobacco, such as cigarettes, e-cigarettes, and chewing tobacco. If you need help quitting, ask your health care provider. Do not use drugs. If you are sexually active, practice safe sex. Use a condom or other form of protection to prevent STIs (sexually transmitted infections). If you do not wish to become pregnant, use a form of birth control. If you plan to become pregnant, see your health care   provider for a prepregnancy visit. Find healthy ways to cope with stress, such as: Meditation, yoga, or listening to music. Journaling. Talking to a trusted person. Spending time with friends and family. Safety Always wear your seat belt while driving or riding in a  vehicle. Do not drive: If you have been drinking alcohol. Do not ride with someone who has been drinking. When you are tired or distracted. While texting. Wear a helmet and other protective equipment during sports activities. If you have firearms in your house, make sure you follow all gun safety procedures. Seek help if you have been physically or sexually abused. What's next? Go to your health care provider once a year for an annual wellness visit. Ask your health care provider how often you should have your eyes and teeth checked. Stay up to date on all vaccines. This information is not intended to replace advice given to you by your health care provider. Make sure you discuss any questions you have with your healthcare provider. Document Revised: 04/21/2020 Document Reviewed: 05/05/2018 Elsevier Patient Education  2022 [employer].

## 2021-03-24 NOTE — Assessment & Plan Note (Signed)
UA clean Likely related to pelvic floor weakness as above

## 2021-03-24 NOTE — Assessment & Plan Note (Signed)
New since delivery of her twins No urge incontinence Will refer to pelvic floor PT

## 2021-03-24 NOTE — Assessment & Plan Note (Signed)
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

## 2021-03-24 NOTE — Assessment & Plan Note (Signed)
Discussed importance of healthy weight management Discussed diet and exercise  

## 2021-03-25 LAB — COMPREHENSIVE METABOLIC PANEL
ALT: 18 IU/L (ref 0–32)
AST: 14 IU/L (ref 0–40)
Albumin/Globulin Ratio: 1.7 (ref 1.2–2.2)
Albumin: 4.3 g/dL (ref 3.9–5.0)
Alkaline Phosphatase: 112 IU/L (ref 44–121)
BUN/Creatinine Ratio: 21 (ref 9–23)
BUN: 13 mg/dL (ref 6–20)
Bilirubin Total: 0.8 mg/dL (ref 0.0–1.2)
CO2: 23 mmol/L (ref 20–29)
Calcium: 10 mg/dL (ref 8.7–10.2)
Chloride: 104 mmol/L (ref 96–106)
Creatinine, Ser: 0.63 mg/dL (ref 0.57–1.00)
Globulin, Total: 2.6 g/dL (ref 1.5–4.5)
Glucose: 81 mg/dL (ref 65–99)
Potassium: 4.7 mmol/L (ref 3.5–5.2)
Sodium: 142 mmol/L (ref 134–144)
Total Protein: 6.9 g/dL (ref 6.0–8.5)
eGFR: 123 mL/min/{1.73_m2} (ref >59)

## 2021-03-25 LAB — LIPID PANEL
Chol/HDL Ratio: 5 ratio — ABNORMAL HIGH (ref 0.0–4.4)
Cholesterol, Total: 175 mg/dL (ref 100–199)
HDL: 35 mg/dL — ABNORMAL LOW (ref >39)
LDL Chol Calc (NIH): 117 mg/dL — ABNORMAL HIGH (ref 0–99)
Triglycerides: 125 mg/dL (ref 0–149)
VLDL Cholesterol Cal: 23 mg/dL (ref 5–40)

## 2021-03-25 LAB — HEPATITIS C ANTIBODY: Hep C Virus Ab: <0.1 s/co ratio (ref 0.0–0.9)

## 2021-06-30 ENCOUNTER — Encounter: Payer: Self-pay | Admitting: Family Medicine

## 2021-06-30 ENCOUNTER — Ambulatory Visit (INDEPENDENT_AMBULATORY_CARE_PROVIDER_SITE_OTHER): Payer: Medicaid Other | Admitting: Family Medicine

## 2021-06-30 VITALS — Wt 263.0 lb

## 2021-06-30 DIAGNOSIS — F17200 Nicotine dependence, unspecified, uncomplicated: Secondary | ICD-10-CM | POA: Diagnosis not present

## 2021-06-30 DIAGNOSIS — F4323 Adjustment disorder with mixed anxiety and depressed mood: Secondary | ICD-10-CM | POA: Diagnosis not present

## 2021-06-30 MED ORDER — SERTRALINE HCL 100 MG PO TABS: 100.0000 mg | ORAL_TABLET | Freq: Every day | ORAL | 1 refills | Status: DC

## 2021-06-30 NOTE — Progress Notes (Signed)
MyChart Video Visit    Virtual Visit via Video Note   This visit type was conducted due to national recommendations for restrictions regarding the COVID-19 Pandemic (e.g. social distancing) in an effort to limit this patient's exposure and mitigate transmission in our community. This patient is at least at moderate risk for complications without adequate follow up. This format is felt to be most appropriate for this patient at this time. Physical exam was limited by quality of the video and audio technology used for the visit.    Patient location: home Provider location: [facility] Persons involved in the visit: patient, provider   I discussed the limitations of evaluation and management by telemedicine and the availability of in person appointments. The patient expressed understanding and agreed to proceed.  Patient: Crystal Hansen   DOB: 1991/01/14   30 y.o. Female  MRN: [id number] Visit Date: 06/30/2021  Today's healthcare provider: Shirlee Latch, MD   Chief Complaint  Patient presents with   Depression   Subjective    HPI  Depression, Follow-up  She  was last seen for this 3 months ago. Changes made at last visit include start Zoloft 50 mg daily.   She reports excellent compliance with treatment. She is not having side effects.   She reports excellent tolerance of treatment. Current symptoms include: fatigue She feels she is Improved since last visit.  Depression screen [facility] 2/9 06/30/2021 03/24/2021 10/31/2020  Decreased Interest 0 0 0  Down, Depressed, Hopeless 0 2 0  PHQ - 2 Score 0 2 0  Altered sleeping 0 2 1  Tired, decreased energy 0 1 2  Change in appetite 1 2 1   Feeling bad or failure about yourself  0 1 0  Trouble concentrating 0 1 0  Moving slowly or fidgety/restless 0 0 0  Suicidal thoughts 0 0 0  PHQ-9 Score 1 9 4   Difficult doing work/chores Not difficult at all Not difficult at all Not difficult at all     ----------------------------------------------------------------------------------------- Anxiety, Follow-up  She was last seen for anxiety 3 months ago. Changes made at last visit include start Zoloft.   She reports excellent compliance with treatment. She reports excellent tolerance of treatment. She is not having side effects.   She feels her anxiety is mild and Improved since last visit.  Symptoms: No chest pain Yes difficulty concentrating  No dizziness Yes fatigue  No feelings of losing control No insomnia  Yes irritable No palpitations  Yes panic attacks Yes racing thoughts  No shortness of breath No sweating  No tremors/shakes    GAD-7 Results GAD-7 Generalized Anxiety Disorder Screening Tool 06/30/2021  1. Feeling Nervous, Anxious, or on Edge 1  2. Not Being Able to Stop or Control Worrying 0  3. Worrying Too Much About Different Things 0  4. Trouble Relaxing 0  5. Being So Restless it's Hard To Sit Still 0  6. Becoming Easily Annoyed or Irritable 1  7. Feeling Afraid As If Something Awful Might Happen 0  Total GAD-7 Score 2  Difficulty At Work, Home, or Getting  Along With Others? Not difficult at all    PHQ-9 Scores PHQ9 SCORE ONLY 06/30/2021 03/24/2021 10/31/2020  PHQ-9 Total Score 1 9 4     --------------------------------------------------------------------------------------------------- Still smoking - not in a place to quit right now.   Medications: Outpatient Medications Prior to Visit  Medication Sig   etonogestrel (NEXPLANON) 68 MG IMPL implant 1 each by Subdermal route once.   sertraline (  ZOLOFT) 50 MG tablet Take 1 tablet (50 mg total) by mouth daily.   No facility-administered medications prior to visit.    Review of Systems - per HPI     Objective    Wt 263 lb (119.3 kg)   BMI 43.10 kg/m     Physical Exam Constitutional:      General: She is not in acute distress.    Appearance: Normal appearance.  HENT:     Head: Normocephalic.   Pulmonary:     Effort: Pulmonary effort is normal. No respiratory distress.  Neurological:     Mental Status: She is alert and oriented to person, place, and time. Mental status is at baseline.       Assessment & Plan     Problem List Items Addressed This Visit       Other   Tobacco use disorder    3-5 minute discussion regarding the harms of tobacco use, the benefits of cessation, and methods of cessation Discussed that there are medication options to help with cessation Patient is currently precontemplative  Will reassess at next visit       Morbid obesity (HCC)    Discussed importance of healthy weight management Discussed diet and exercise       Adjustment disorder with mixed anxiety and depressed mood - Primary    Improving but not quite to goal Increase zoloft to 100mg  daily Encourage therapy Repeat PHQ9 and GAD 7 at f/u appt        No follow-ups on file.     I discussed the assessment and treatment plan with the patient. The patient was provided an opportunity to ask questions and all were answered. The patient agreed with the plan and demonstrated an understanding of the instructions.   The patient was advised to call back or seek an in-person evaluation if the symptoms worsen or if the condition fails to improve as anticipated.  I, , MD, have reviewed all documentation for this visit. The documentation on 07/01/21 for the exam, diagnosis, procedures, and orders are all accurate and complete.   Rani Idler, 07/03/21, MD, MPH [facility]

## 2021-07-01 NOTE — Telephone Encounter (Signed)
Nexplanon rcvd/charged 05/31/2020

## 2021-07-01 NOTE — Assessment & Plan Note (Signed)
Improving but not quite to goal Increase zoloft to 100mg  daily Encourage therapy Repeat PHQ9 and GAD 7 at f/u appt

## 2021-07-01 NOTE — Assessment & Plan Note (Signed)
Discussed importance of healthy weight management Discussed diet and exercise  

## 2021-07-01 NOTE — Assessment & Plan Note (Signed)
3-5 minute discussion regarding the harms of tobacco use, the benefits of cessation, and methods of cessation Discussed that there are medication options to help with cessation Patient is currently precontemplative  Will reassess at next visit  

## 2021-07-15 ENCOUNTER — Telehealth: Payer: Self-pay | Admitting: Family Medicine

## 2021-07-15 MED ORDER — SERTRALINE HCL 100 MG PO TABS: 100.0000 mg | ORAL_TABLET | Freq: Every day | ORAL | 1 refills | Status: DC

## 2021-07-15 NOTE — Telephone Encounter (Signed)
[facility] faxed refill request for the following medications:  sertraline (ZOLOFT) 100 MG tablet   Please advise.  

## 2021-08-06 ENCOUNTER — Ambulatory Visit: Payer: Self-pay | Admitting: *Deleted

## 2021-08-06 NOTE — Telephone Encounter (Signed)
Pt reports rash, various places, various appearance. Reports onset Tuesday. Does not recall any bite. Rash between thighs, on hands, back, stomach fold. States some areas with mosquito type bumps, others "Gaulded, on my stomach." States some are blister like. Severe itching, no pain, no fever. No availability within protocol timeframe. Care advise given. Advised virtual E visit via [facility] platform. Pt verbalizes understanding, Will follow disposition.        Reason for Disposition  SEVERE itching (i.e., interferes with sleep, normal activities or school)  Answer Assessment - Initial Assessment Questions 1. APPEARANCE of RASH: "Describe the rash." (e.g., spots, blisters, raised areas, skin peeling, scaly)     Some like 2. SIZE: "How big are the spots?" (e.g., tip of pen, eraser, coin; inches, centimeters)     Like mosquito bites 3. LOCATION: "Where is the rash located?"     Various places , back, hands, stomach fold, between thighs 4. COLOR: "What color is the rash?" (Note: It is difficult to assess rash color in people with darker-colored skin. When this situation occurs, simply ask the caller to describe what they see.)     red 5. ONSET: "When did the rash begin?"     Tuesday 6. FEVER: "Do you have a fever?" If Yes, ask: "What is your temperature, how was it measured, and when did it start?"     no 7. ITCHING: "Does the rash itch?" If Yes, ask: "How bad is the itch?" (Scale 1-10; or mild, moderate, severe)     Burning and itching 8. CAUSE: "What do you think is causing the rash?"     Unsure 9. MEDICINE FACTORS: "Have you started any new medicines within the last 2 weeks?" (e.g., antibiotics)      no 10. OTHER SYMPTOMS: "Do you have any other symptoms?" (e.g., dizziness, headache, sore throat, joint pain)       None  Protocols used: Rash or Redness - [facility]

## 2021-08-18 ENCOUNTER — Other Ambulatory Visit: Payer: Self-pay

## 2021-08-18 ENCOUNTER — Ambulatory Visit: Payer: Self-pay

## 2021-08-18 ENCOUNTER — Telehealth (INDEPENDENT_AMBULATORY_CARE_PROVIDER_SITE_OTHER): Payer: Medicaid Other | Admitting: Family Medicine

## 2021-08-18 DIAGNOSIS — F4323 Adjustment disorder with mixed anxiety and depressed mood: Secondary | ICD-10-CM | POA: Diagnosis not present

## 2021-08-18 DIAGNOSIS — R45851 Suicidal ideations: Secondary | ICD-10-CM

## 2021-08-18 MED ORDER — SERTRALINE HCL 50 MG PO TABS: 50.0000 mg | ORAL_TABLET | Freq: Every day | ORAL | 3 refills | Status: DC

## 2021-08-18 NOTE — Telephone Encounter (Addendum)
  Chief Complaint: Suicidal thoughts Symptoms: Not sleeping, angry Frequency: Last week Pertinent Negatives: Patient denies having a plan to harm herself. Disposition: [] ED /[] Urgent Care (no appt availability in office) / [x] Appointment(In office/virtual)/ []  [facility]/ [] Home Care/ [] Refused Recommended Disposition  Additional Notes: Pt. States her Zoloft was increased to 100 mg and was told if she had thoughts of harming herself to call. States she started having these thoughts after the increase and she stopped the medicine last. States "those thoughts have dwindled, but are still there. I have 2 babies so I won't hurt myself. I have to take care of them." Instructed to go to ED for worsening of symptoms. Appointment made today per [facility] in the practice.   Reason for Disposition  Depression symptoms (sadness, hopelessness, decreased energy) interfere with work or school  Answer Assessment - Initial Assessment Questions 1. MAIN CONCERN: "What happened that made you call today?"     Self harm 2. RISK OF HARM - SUICIDAL IDEATION:  "Do you ever have thoughts of hurting or killing yourself?"  (e.g., yes, no, no but preoccupation with thoughts about death)   - WISH TO BE DEAD:  "Have you wished you were dead or wished you could go to sleep and not wake up?"   - INTENT:  "Have you had any thoughts of hurting or killing yourself?" (e.g., yes, no, N/A) If Yes, ask: "Are you having these thoughts about killing yourself right NOW?"   - PLAN: "Have you thought about how you might do this?" "Do you have a specific plan for how you would do this?" (e.g., gun, knife, overdose, no plan, N/A)   - ACCESS: If yes to PLAN, "Do you have access to gun?" (e.g., pills, gun in house, knife in kitchen)     Yes 3. RISK OF HARM - SUICIDE ATTEMPT: "Have you tried to harm yourself recently?" If Yes, ask: When was this?"       No 4. RISK OF HARM - SUICIDAL BEHAVIOR: "Have you ever done anything, started to  do anything, or prepared to do anything to end your life?" (e.g., collected pills, bought a gun, wrote a suicide note, cut yourself, started but changed your mind)     Has a gun in the house 5. EVENTS AND STRESSORS: "Has there been any new stress or recent changes in your life?" (e.g., death of loved one, homelessness, negative event, relationship breakup, work)     No 6. FUNCTIONAL IMPAIRMENT: "How have things been going for you overall? Have you had more difficulty than usual doing your normal daily activities?"  (e.g., better, same, worse; self-care, school, work, interactions)     Worse 7. SUPPORT: "Who is with you now?" "Who do you live with?" "Do you have family or friends who you can talk to?"      Her children 8. THERAPIST: "Do you have a counselor or therapist? Name?"     No 9. ALCOHOL USE OR SUBSTANCE USE (DRUG USE): "Do you drink alcohol or use any illegal drugs?"     No 10. OTHER: "Do you have any other physical symptoms right now?" (e.g., fever)       Not sleeping 11. PREGNANCY or POSTPARTUM: "Is there any chance you are pregnant?" "When was your last menstrual period?" "Were you recently pregnant?" "When did you give birth?"       No  Protocols used: Suicide Concerns-A-AH

## 2021-08-18 NOTE — Progress Notes (Signed)
Established patient visit   Patient: Crystal Hansen   DOB: 1991/04/19   30 y.o. Female  MRN: [id number] Visit Date: 08/18/2021  Today's healthcare provider: Shirlee Latch, MD   Chief Complaint  Patient presents with   Depression    Subjective     Started Zoloft in July and she initially felt like it was working well Early-mid November when her Zoloft dose was increased to 100mg  was when things started getting worse Last Monday, she had intrusive suicidal ideation, but no intent or plan "Only thing keeping me going was my kids" Stopped medication on Monday She talked to her boyfriend on Friday who recommended that she call PCP She has access to multiple firearms that are stored with loaded ammunition  Medications: Outpatient Medications Prior to Visit  Medication Sig   etonogestrel (NEXPLANON) 68 MG IMPL implant 1 each by Subdermal route once.   [DISCONTINUED] sertraline (ZOLOFT) 100 MG tablet Take 1 tablet (100 mg total) by mouth daily.   No facility-administered medications prior to visit.    Review of Systems  Psychiatric/Behavioral:  Positive for dysphoric mood and suicidal ideas. The patient is nervous/anxious.   All other systems reviewed and are negative.     Objective    There were no vitals taken for this visit. {Show previous vital signs (optional):23777}  Physical Exam Constitutional:      Appearance: Normal appearance. She is not ill-appearing or toxic-appearing.  HENT:     Head: Normocephalic and atraumatic.     Right Ear: External ear normal.     Left Ear: External ear normal.     Nose: Rhinorrhea present.  Eyes:     General: No scleral icterus.    Extraocular Movements: Extraocular movements intact.     Conjunctiva/sclera: Conjunctivae normal.     Pupils: Pupils are equal, round, and reactive to light.  Pulmonary:     Effort: Pulmonary effort is normal.  Musculoskeletal:     Cervical back: Normal range of motion.   Neurological:     General: No focal deficit present.     Mental Status: She is alert and oriented to person, place, and time.  Psychiatric:        Attention and Perception: Attention and perception normal.        Mood and Affect: Mood is anxious. Affect is tearful.        Speech: Speech normal.        Behavior: Behavior normal. Behavior is cooperative.        Thought Content: Thought content normal.        Judgment: Judgment normal.     No results found for any visits on 08/18/21.  Assessment & Plan     Problem List Items Addressed This Visit       Other   Adjustment disorder with mixed anxiety and depressed mood - Primary    Poorly controlled Patient stopped taking medication 1 week ago Restart Zoloft at 50mg  daily Referral to psychology Counseled on storing firearms and ammunition separately Counseled on 988 suicide hotline number F/u at appointment in January      Relevant Orders   Ambulatory referral to Psychology   Other Visit Diagnoses     Suicidal thoughts          - passive with no plan - if recur on Zoloft, will need to find a different medication - contracted for safety as above  No follow-ups on file.      09-13-1986  Rosey Bath, Medical Student 08/19/2021, 8:15 AM   Patient seen along with MS3 student Gwyndolyn Kaufman. I personally evaluated this patient along with the student, and verified all aspects of the history, physical exam, and medical decision making as documented by the student. I agree with the student's documentation and have made all necessary edits.  Risa Auman, Marzella Schlein, MD, MPH [facility]

## 2021-08-18 NOTE — Assessment & Plan Note (Signed)
Poorly controlled Patient stopped taking medication 1 week ago Restart Zoloft at 50mg  daily Referral to psychology Counseled on storing firearms and ammunition separately Counseled on 988 suicide hotline number F/u at appointment in January

## 2021-08-18 NOTE — Telephone Encounter (Incomplete Revision)
°  Chief Complaint: Suicidal thoughts Symptoms: Not sleeping, angry Frequency: Last week Pertinent Negatives: Patient denies having a plan to harm herself. Disposition: []ED /[]Urgent Care (no appt availability in office) / [x]Appointment(In office/virtual)/ [] [facility]/ []Home Care/ []Refused Recommended Disposition  Additional Notes: Pt. States her Zoloft was increased to 100 mg and was told if she had thoughts of harming herself to call. States she started having these thoughts after the increase and she stopped the medicine last. States "those thoughts have dwindled, but are still there. I have 2 babies so I won't hurt myself. I have to take care of them." Instructed to go to ED for worsening of symptoms. Appointment made today per Elena in the practice.   Reason for Disposition  Depression symptoms (sadness, hopelessness, decreased energy) interfere with work or school  Answer Assessment - Initial Assessment Questions 1. MAIN CONCERN: "What happened that made you call today?"     Self harm 2. RISK OF HARM - SUICIDAL IDEATION:  "Do you ever have thoughts of hurting or killing yourself?"  (e.g., yes, no, no but preoccupation with thoughts about death)   - WISH TO BE DEAD:  "Have you wished you were dead or wished you could go to sleep and not wake up?"   - INTENT:  "Have you had any thoughts of hurting or killing yourself?" (e.g., yes, no, N/A) If Yes, ask: "Are you having these thoughts about killing yourself right NOW?"   - PLAN: "Have you thought about how you might do this?" "Do you have a specific plan for how you would do this?" (e.g., gun, knife, overdose, no plan, N/A)   - ACCESS: If yes to PLAN, "Do you have access to gun?" (e.g., pills, gun in house, knife in kitchen)     Yes 3. RISK OF HARM - SUICIDE ATTEMPT: "Have you tried to harm yourself recently?" If Yes, ask: When was this?"       No 4. RISK OF HARM - SUICIDAL BEHAVIOR: "Have you ever done anything, started to  do anything, or prepared to do anything to end your life?" (e.g., collected pills, bought a gun, wrote a suicide note, cut yourself, started but changed your mind)     Has a gun in the house 5. EVENTS AND STRESSORS: "Has there been any new stress or recent changes in your life?" (e.g., death of loved one, homelessness, negative event, relationship breakup, work)     No 6. FUNCTIONAL IMPAIRMENT: "How have things been going for you overall? Have you had more difficulty than usual doing your normal daily activities?"  (e.g., better, same, worse; self-care, school, work, interactions)     Worse 7. SUPPORT: "Who is with you now?" "Who do you live with?" "Do you have family or friends who you can talk to?"      Her children 8. THERAPIST: "Do you have a counselor or therapist? Name?"     No 9. ALCOHOL USE OR SUBSTANCE USE (DRUG USE): "Do you drink alcohol or use any illegal drugs?"     No 10. OTHER: "Do you have any other physical symptoms right now?" (e.g., fever)       Not sleeping 11. PREGNANCY or POSTPARTUM: "Is there any chance you are pregnant?" "When was your last menstrual period?" "Were you recently pregnant?" "When did you give birth?"       No  Protocols used: Suicide Concerns-A-AH  

## 2021-08-18 NOTE — Telephone Encounter (Signed)
Noted  

## 2021-08-19 ENCOUNTER — Encounter: Payer: Self-pay | Admitting: Family Medicine

## 2021-08-22 ENCOUNTER — Telehealth: Payer: Medicaid Other | Admitting: Family Medicine

## 2021-08-27 ENCOUNTER — Telehealth: Payer: Self-pay

## 2021-08-27 NOTE — Telephone Encounter (Signed)
Please review message below and advise if ok to wait for Dr. B. Patient recently seen for Depression/ Anxiety and suicidal ideation.

## 2021-08-27 NOTE — Telephone Encounter (Signed)
Spoke with patient on the phone who reports that she feels well and not currently experiencing new or worsening symptoms associated with anxiety or depression. Patient denied suicidal thoughts or plan. I gave patient resources to local agencies in area such as [facility], [facility] and [facility] which has walk in clinic hours from 1-7PM. I tried looking at Dr. Beryle Flock schedule to see if I can move patients appointment sooner but I did not see an available day prior to the 24th. Can you please have Dr. Gasper Sells review her schedule to see when she can see patient and contact her back to schedule. Thank You. KW

## 2021-08-27 NOTE — Telephone Encounter (Signed)
Copied from CRM [id number]. Topic: General - Other >> Aug 27, 2021 12:01 PM Traci Sermon wrote: Reason for CRM: Pt called in to inform that, the referrals that she was sent for a therapist the first one doesn't take referrals and the second one doesn't accpet her insurance, pt also wanted to let PCP know that she is no longer taking her medications do to it upsetting her stomach, but she also mentioned that she is not having any bad thoughts, just having anger issues, please advise.

## 2021-08-28 NOTE — Telephone Encounter (Signed)
If she is feeling fine ok to keep our appt that is scheduled. If she wants to be seen sooner, can see if there is a nurse visit to double book or any appt that could be shortened.  Please send message to Crystal Hansen or referral coordinators about the referral going to places that do not accept her insurance.

## 2021-08-28 NOTE — Telephone Encounter (Signed)
Patient reports that she is feeling fine. That she will keep the scheduled appointment.

## 2021-08-28 NOTE — Telephone Encounter (Signed)
Crystal Hansen, This patient referral has been going to different places that do not accept her insurance. Can you take a look at this please.

## 2021-09-29 NOTE — Progress Notes (Signed)
MyChart Video Visit    Virtual Visit via Video Note   This visit type was conducted due to national recommendations for restrictions regarding the COVID-19 Pandemic (e.g. social distancing) in an effort to limit this patient's exposure and mitigate transmission in our community. This patient is at least at moderate risk for complications without adequate follow up. This format is felt to be most appropriate for this patient at this time. Physical exam was limited by quality of the video and audio technology used for the visit.    Patient location: home Provider location: [place] involved in the visit: patient, provider  I discussed the limitations of evaluation and management by telemedicine and the availability of in person appointments. The patient expressed understanding and agreed to proceed.  Patient: Crystal Hansen   DOB: 1991-02-13   31 y.o. Female  MRN: [id number] Visit Date: 09/30/2021  Today's healthcare provider: Lavon Paganini, MD    I,Joseline E Rosas,acting as a scribe for Lavon Paganini, MD.,have documented all relevant documentation on the behalf of Lavon Paganini, MD,as directed by  Lavon Paganini, MD while in the presence of Lavon Paganini, MD.   Chief Complaint  Patient presents with   Follow-up   Subjective    HPI  Follow up for Adjustment disorder with mixed anxiety and depressed mood:  The patient was last seen for this 1 months ago. Changes made at last visit include Restart Zoloft at 50mg  daily Referral to psychology Counseled on storing firearms and ammunition separately Counseled on 988 suicide hotline number.  Patient reports she is not on any medicine at this time. She tried Zoloft and felt like it made things worse, so she discontinued.  She thinks things are better.  Staying away from negativity and communicating more with her significant other.  She is having problems scheduling appointment with  a psychologist because of her insurance.  She is wanting to hold off on that - she is journaling and this is helping.  States she was diagnosed with ADD in 4th grade. Not on meds since 8th grade. She struggled in school without this. She is worried that this may be underlying her lack of motivation.  Depression screen [facility] 2/9 09/30/2021 06/30/2021 03/24/2021  Decreased Interest 1 0 0  Down, Depressed, Hopeless 0 0 2  PHQ - 2 Score 1 0 2  Altered sleeping 1 0 2  Tired, decreased energy 1 0 1  Change in appetite 0 1 2  Feeling bad or failure about yourself  0 0 1  Trouble concentrating 0 0 1  Moving slowly or fidgety/restless 0 0 0  Suicidal thoughts 0 0 0  PHQ-9 Score 3 1 9   Difficult doing work/chores Not difficult at all Not difficult at all Not difficult at all    GAD 7 : Generalized Anxiety Score 09/30/2021 06/30/2021  Nervous, Anxious, on Edge 1 1  Control/stop worrying 1 0  Worry too much - different things 1 0  Trouble relaxing 2 0  Restless 1 0  Easily annoyed or irritable 3 1  Afraid - awful might happen 1 0  Total GAD 7 Score 10 2  Anxiety Difficulty Somewhat difficult Not difficult at all     -----------------------------------------------------------------------------------------    Medications: Outpatient Medications Prior to Visit  Medication Sig   etonogestrel (NEXPLANON) 68 MG IMPL implant 1 each by Subdermal route once.   [DISCONTINUED] sertraline (ZOLOFT) 50 MG tablet Take 1 tablet (50 mg total) by mouth daily. (Patient  not taking: Reported on 09/30/2021)   No facility-administered medications prior to visit.    Review of Systems per HPI    Objective    There were no vitals taken for this visit.   Physical Exam Constitutional:      General: She is not in acute distress.    Appearance: Normal appearance.  HENT:     Head: Normocephalic.  Pulmonary:     Effort: Pulmonary effort is normal. No respiratory distress.  Neurological:     Mental Status:  She is alert and oriented to person, place, and time. Mental status is at baseline.       Assessment & Plan     Problem List Items Addressed This Visit       Other   Adjustment disorder with mixed anxiety and depressed mood - Primary    Well controlled now Not on meds Declines repeat referral to therapy at this time as she is doing well      Concentration deficit    Diagnosed with ADD as a child Will refer to Neuropsych for repeat testing and then consider treatment      Relevant Orders   Ambulatory referral to Neuropsychology     Return in about 6 months (around 03/30/2022) for CPE.     I discussed the assessment and treatment plan with the patient. The patient was provided an opportunity to ask questions and all were answered. The patient agreed with the plan and demonstrated an understanding of the instructions.   The patient was advised to call back or seek an in-person evaluation if the symptoms worsen or if the condition fails to improve as anticipated.  I, Lavon Paganini, MD, have reviewed all documentation for this visit. The documentation on 09/30/21 for the exam, diagnosis, procedures, and orders are all accurate and complete.   Cearra Portnoy, Dionne Bucy, MD, MPH [facility]

## 2021-09-30 ENCOUNTER — Encounter: Payer: Self-pay | Admitting: Family Medicine

## 2021-09-30 ENCOUNTER — Telehealth (INDEPENDENT_AMBULATORY_CARE_PROVIDER_SITE_OTHER): Payer: Medicaid Other | Admitting: Family Medicine

## 2021-09-30 DIAGNOSIS — R41840 Attention and concentration deficit: Secondary | ICD-10-CM | POA: Diagnosis not present

## 2021-09-30 DIAGNOSIS — F4323 Adjustment disorder with mixed anxiety and depressed mood: Secondary | ICD-10-CM

## 2021-09-30 NOTE — Assessment & Plan Note (Signed)
Well controlled now Not on meds Declines repeat referral to therapy at this time as she is doing well

## 2021-09-30 NOTE — Assessment & Plan Note (Signed)
Diagnosed with ADD as a child Will refer to Neuropsych for repeat testing and then consider treatment

## 2021-10-08 ENCOUNTER — Ambulatory Visit: Payer: Medicaid Other | Admitting: Psychology

## 2022-01-07 NOTE — Progress Notes (Signed)
?  ? ?I,Sulibeya S Dimas,acting as a scribe for Shirlee Latch, MD.,have documented all relevant documentation on the behalf of Shirlee Latch, MD,as directed by  Shirlee Latch, MD while in the presence of Shirlee Latch, MD. ? ? ?Established patient visit ? ? ?Patient: Crystal Hansen   DOB: May 25, 1991   31 y.o. Female  MRN: [id number] ?Visit Date: 01/08/2022 ? ?Today's healthcare provider: Shirlee Latch, MD  ? ?Chief Complaint  ?Patient presents with  ? Menstrual Problem  ? ?Subjective  ?  ?HPI  ?Patient C/O heavy/painful menstrual cycles lasting 5-7 days. Patient reports severe cramps, back pain, nausea, vomiting and diarrhea with last period. Which started on 01/02/22. She reports taking tylenol and pain medication, reports no pain control. Patient reports irregular periods with nexplanon.   Skipped last month's period.  This is the first heavy period and dysmenorrhea.  This is her third Nexplanon. Placed in 05/2020. ? ?Bleeding is getting lighter.  Last N/V/D on 01/06/22.  Pain is improving, but still bad at times. ? ?Medications: ?Outpatient Medications Prior to Visit  ?Medication Sig  ? etonogestrel (NEXPLANON) 68 MG IMPL implant 1 each by Subdermal route once.  ? ?No facility-administered medications prior to visit.  ? ? ?Review of Systems  ?Cardiovascular:  Negative for chest pain and palpitations.  ?Gastrointestinal:  Positive for abdominal pain, diarrhea, nausea and vomiting.  ?Hematological:  Bruises/bleeds easily.  ? ? ?  Objective  ?  ?BP 124/84 (Patient Position: Sitting, Cuff Size: Large)   Pulse (!) 120   Temp 97.8 ?F (36.6 ?C) (Oral)   Resp 16   Ht 5' 5.5" (1.664 m)   Wt 260 lb 12.8 oz (118.3 kg)   LMP 01/02/2022 (Exact Date)   SpO2 99%   BMI 42.74 kg/m?  ? ? ?Physical Exam ?Vitals reviewed.  ?Constitutional:   ?   General: She is not in acute distress. ?   Appearance: She is well-developed.  ?HENT:  ?   Head: Normocephalic and atraumatic.  ?Eyes:  ?   General: No scleral  icterus. ?   Conjunctiva/sclera: Conjunctivae normal.  ?Cardiovascular:  ?   Rate and Rhythm: Normal rate and regular rhythm.  ?Pulmonary:  ?   Effort: Pulmonary effort is normal. No respiratory distress.  ?Abdominal:  ?   General: There is no distension.  ?   Palpations: Abdomen is soft.  ?   Tenderness: There is no abdominal tenderness. There is no guarding.  ?Genitourinary: ?   Comments: GYN:  External genitalia within normal limits.  Vaginal mucosa pink, moist, normal rugae.  Nonfriable cervix without lesions, no discharge, +bleeding noted on speculum exam.  Bimanual exam revealed normal, nongravid uterus.  No cervical motion tenderness. No adnexal masses bilaterally.    ?Skin: ?   General: Skin is warm and dry.  ?   Findings: No rash.  ?Neurological:  ?   Mental Status: She is alert and oriented to person, place, and time.  ?Psychiatric:     ?   Behavior: Behavior normal.  ?  ? ? ?Results for orders placed or performed in visit on 01/08/22  ?POCT urine pregnancy  ?Result Value Ref Range  ? Preg Test, Ur Negative Negative  ? ? Assessment & Plan  ?  ? ?Problem List Items Addressed This Visit   ? ?  ? Genitourinary  ? Dysmenorrhea  ?  New problem ?Suspect that her nausea and vomiting was related to pain ?Is improving some, but still difficult to manage at times ?  Suspect this is related to her menorrhagia and irregular cycles as below-see plan below ?No abnormalities on exam ?Mobic as needed ? ?  ?  ? Relevant Orders  ? TSH  ? CBC  ? Prolactin  ? POCT urine pregnancy (Completed)  ?  ? Other  ? Menorrhagia with irregular cycle - Primary  ?  New problem ?Suspect this is related to Nexplanon use ?Negative pregnancy test ?Check TSH, prolactin, CBC ?No indication for hormonal testing at this time given that it will not be very accurate with Nexplanon in place ?No structural abnormalities on exam today ?Consider TVUS if continues or worsens ?Consider GYN referral if continues or worsens ?1 month of OCPs to see if we can  stabilize the endometrium while on Nexplanon ?No estrogen contraindications ? ?  ?  ? Relevant Orders  ? TSH  ? CBC  ? Prolactin  ? POCT urine pregnancy (Completed)  ?  ? ?No follow-ups on file.  ?   ? ?I, Shirlee Latch, MD, have reviewed all documentation for this visit. The documentation on 01/08/22 for the exam, diagnosis, procedures, and orders are all accurate and complete. ? ? ?Erasmo Downer, MD, MPH ?[facility] ?[facility]   ?

## 2022-01-08 ENCOUNTER — Encounter: Payer: Self-pay | Admitting: Family Medicine

## 2022-01-08 ENCOUNTER — Ambulatory Visit (INDEPENDENT_AMBULATORY_CARE_PROVIDER_SITE_OTHER): Payer: Medicaid Other | Admitting: Family Medicine

## 2022-01-08 ENCOUNTER — Ambulatory Visit: Payer: Medicaid Other | Admitting: Family Medicine

## 2022-01-08 VITALS — BP 124/84 | HR 120 | Temp 97.8°F | Resp 16 | Ht 65.5 in | Wt 260.8 lb

## 2022-01-08 DIAGNOSIS — N921 Excessive and frequent menstruation with irregular cycle: Secondary | ICD-10-CM | POA: Diagnosis not present

## 2022-01-08 DIAGNOSIS — N946 Dysmenorrhea, unspecified: Secondary | ICD-10-CM

## 2022-01-08 LAB — POCT URINE PREGNANCY: Preg Test, Ur: NEGATIVE

## 2022-01-08 MED ORDER — NORETHINDRONE ACET-ETHINYL EST 1.5-30 MG-MCG PO TABS: 1.0000 | ORAL_TABLET | Freq: Every day | ORAL | 0 refills | Status: DC

## 2022-01-08 MED ORDER — MELOXICAM 15 MG PO TABS: 15.0000 mg | ORAL_TABLET | Freq: Every day | ORAL | 0 refills | Status: DC

## 2022-01-08 NOTE — Assessment & Plan Note (Signed)
New problem ?Suspect that her nausea and vomiting was related to pain ?Is improving some, but still difficult to manage at times ?Suspect this is related to her menorrhagia and irregular cycles as below-see plan below ?No abnormalities on exam ?Mobic as needed ?

## 2022-01-08 NOTE — Assessment & Plan Note (Signed)
New problem ?Suspect this is related to Nexplanon use ?Negative pregnancy test ?Check TSH, prolactin, CBC ?No indication for hormonal testing at this time given that it will not be very accurate with Nexplanon in place ?No structural abnormalities on exam today ?Consider TVUS if continues or worsens ?Consider GYN referral if continues or worsens ?1 month of OCPs to see if we can stabilize the endometrium while on Nexplanon ?No estrogen contraindications ?

## 2022-01-09 LAB — CBC
Hematocrit: 42 % (ref 34.0–46.6)
Hemoglobin: 14.2 g/dL (ref 11.1–15.9)
MCH: 27.7 pg (ref 26.6–33.0)
MCHC: 33.8 g/dL (ref 31.5–35.7)
MCV: 82 fL (ref 79–97)
Platelets: 360 10*3/uL (ref 150–450)
RBC: 5.13 x10E6/uL (ref 3.77–5.28)
RDW: 13.1 % (ref 11.7–15.4)
WBC: 14.4 10*3/uL — ABNORMAL HIGH (ref 3.4–10.8)

## 2022-01-09 LAB — TSH: TSH: 0.86 u[IU]/mL (ref 0.450–4.500)

## 2022-01-09 LAB — PROLACTIN: Prolactin: 7.4 ng/mL (ref 4.8–23.3)

## 2022-01-12 ENCOUNTER — Ambulatory Visit: Payer: Self-pay | Admitting: *Deleted

## 2022-01-12 ENCOUNTER — Encounter: Payer: Self-pay | Admitting: *Deleted

## 2022-01-12 NOTE — Telephone Encounter (Signed)
Patient returned call to see if PCP has recommendations for a different medication or additional medication for menstrual pain. Please advise. Recommended if pain persists and becomes worse go to ED. Patient verbalized understanding .  ? ? ?

## 2022-01-12 NOTE — Telephone Encounter (Signed)
I returned her call.   Was prescribed Mobic for severe menstrual pain but it's not working.   Can something stronger be prescribed?   ? ? ?Reason for Disposition ? [1] Caller has URGENT medicine question about med that PCP or specialist prescribed AND [2] triager unable to answer question ? ?Answer Assessment - Initial Assessment Questions ?1. NAME of MEDICATION: "What medicine are you calling about?" ?    Mobic ?2. QUESTION: "What is your question?" (e.g., double dose of medicine, side effect) ?    It's not helping the severe menstrual cramps.  It's not even touching the pain. ?3. PRESCRIBING HCP: "Who prescribed it?" Reason: if prescribed by specialist, call should be referred to that group. ?    Dr. Brita Romp ?4. SYMPTOMS: "Do you have any symptoms?" ?    Severe menstrual cramps ?5. SEVERITY: If symptoms are present, ask "Are they mild, moderate or severe?" ?    Severe ?6. PREGNANCY:  "Is there any chance that you are pregnant?" "When was your last menstrual period?" ?    Not asked ? ?Protocols used: Medication Question Call-A-AH ? ?

## 2022-01-12 NOTE — Telephone Encounter (Signed)
?  Chief Complaint: The Mobic is not touching the severe menstrual pain.  Seen by Dr. Beryle Flock 01/08/2022.   Requesting something stronger for pain like the Ibuprofen 800 mg.  She's tried Lesser strengths of ibuprofen and extra strength Tylenol. ?Symptoms: Severe menstrual cramps/pain ?Frequency: Now ?Pertinent Negatives: Patient denies N/A ?Disposition: [] ED /[] Urgent Care (no appt availability in office) / [] Appointment(In office/virtual)/ []  [place] [facility]/ [] Home Care/ [] Refused Recommended Disposition /[] [place] [facility]/ [x]  Follow-up with PCP ?Additional Notes: High priority message sent to Dr. .  ?

## 2022-01-12 NOTE — Telephone Encounter (Signed)
This encounter was created in error - please disregard.

## 2022-01-13 ENCOUNTER — Telehealth: Payer: Self-pay | Admitting: Family Medicine

## 2022-01-13 MED ORDER — IBUPROFEN 800 MG PO TABS: 800.0000 mg | ORAL_TABLET | Freq: Three times a day (TID) | ORAL | 1 refills | Status: DC

## 2022-01-13 NOTE — Telephone Encounter (Signed)
See triage call 01/12/22- patient requesting response. ?

## 2022-01-13 NOTE — Addendum Note (Signed)
Addended by: Hyacinth Meeker on: 01/13/2022 10:45 AM ? ? Modules accepted: Orders ? ?

## 2022-01-13 NOTE — Telephone Encounter (Signed)
We can switch from Mobic to ibuprofen 800mg  TID prn. Cannot take both together. Ok to send new Rx for ibuprofen #60 r1

## 2022-01-13 NOTE — Telephone Encounter (Signed)
Copied from CRM [id number]. Topic: General - Other ?>> Jan 13, 2022  7:47 AM Jaquita Rector A wrote: ?Reason for CRM:  Patient called in to follow up on the multiple calls from yesterday needing something other than meloxicam (MOBIC) 15 MG tablet for her menstraul cramps. Want Dr B to know that she is still hurting and would like something today or at least a call back saying that her messages have been received. Can be reached at  Ph# [phone] ?

## 2022-01-13 NOTE — Telephone Encounter (Signed)
See other phone note with response ?

## 2022-01-19 ENCOUNTER — Encounter: Payer: Self-pay | Admitting: Family Medicine

## 2022-01-19 ENCOUNTER — Ambulatory Visit (INDEPENDENT_AMBULATORY_CARE_PROVIDER_SITE_OTHER): Payer: Medicaid Other | Admitting: Family Medicine

## 2022-01-19 VITALS — BP 129/65 | Temp 98.6°F | Resp 16 | Wt 264.1 lb

## 2022-01-19 DIAGNOSIS — J0110 Acute frontal sinusitis, unspecified: Secondary | ICD-10-CM

## 2022-01-19 DIAGNOSIS — R1011 Right upper quadrant pain: Secondary | ICD-10-CM | POA: Diagnosis not present

## 2022-01-19 DIAGNOSIS — R350 Frequency of micturition: Secondary | ICD-10-CM

## 2022-01-19 LAB — POCT URINALYSIS DIPSTICK
Bilirubin, UA: NEGATIVE
Glucose, UA: NEGATIVE
Ketones, UA: NEGATIVE
Nitrite, UA: NEGATIVE
Protein, UA: POSITIVE — AB
Spec Grav, UA: 1.01 (ref 1.010–1.025)
Urobilinogen, UA: 1 E.U./dL
pH, UA: 7 (ref 5.0–8.0)

## 2022-01-19 MED ORDER — AZITHROMYCIN 250 MG PO TABS: ORAL_TABLET | ORAL | 0 refills | Status: AC

## 2022-01-19 MED ORDER — DOXYCYCLINE HYCLATE 100 MG PO TABS: 100.0000 mg | ORAL_TABLET | Freq: Two times a day (BID) | ORAL | 0 refills | Status: DC

## 2022-01-19 NOTE — Assessment & Plan Note (Signed)
Acute, stable ?Has been using flonase and zyrtec and benadryl ?Tenderness present ?Allergic to PCN ?

## 2022-01-19 NOTE — Assessment & Plan Note (Signed)
UA positive; will send for UCx ?Plan to hold urine Abx at this time ?Pt will do AZO and cranberry and force water ?Plan to treat if UCx positive given on respiratory abx  ?

## 2022-01-19 NOTE — Progress Notes (Signed)
?  ? ? ?Established patient visit ? ? ?Patient: Crystal Hansen   DOB: Dec 12, 1990   31 y.o. Female  MRN: [id number] ?Visit Date: 01/19/2022 ? ?Today's healthcare provider: Jacky Kindle, FNP  ? ?Rae Lips as a scribe for Jacky Kindle, FNP.,have documented all relevant documentation on the behalf of Jacky Kindle, FNP,as directed by  Jacky Kindle, FNP while in the presence of Jacky Kindle, FNP.  ?Chief Complaint  ?Patient presents with  ? Follow-up  ? Enviromental allergies  ?  Patient reports that she has been sufferring with seasonal allergies and has been using otc nasal spray with no relief. Patient would like to discuss starting prescription strength medication.   ? ?Subjective  ?  ?HPI ?HPI   ? ? Enviromental allergies   ? Additional comments: Patient reports that she has been sufferring with seasonal allergies and has been using otc nasal spray with no relief. Patient would like to discuss starting prescription strength medication.  ? ?  ?  ?Last edited by Fonda Kinder, CMA on 01/19/2022  4:10 PM.  ?  ?  ?Follow up for Menorrhagia  ? ?The patient was last seen for this 1 months ago. ?Changes made at last visit include Suspect this is related to Nexplanon use ?Negative pregnancy test ?Check TSH, prolactin, CBC ?No indication for hormonal testing at this time given that it will not be very accurate with Nexplanon in place ?No structural abnormalities on exam today ?Consider TVUS if continues or worsens ?Consider GYN referral if continues or worsens ?1 month of OCPs to see if we can stabilize the endometrium while on Nexplanon ?No estrogen contraindications. ?She reports excellent compliance with treatment. ?She feels that condition is Unchanged.patient reports that bleeding has resolved but reports RUQ pain that she describes as sharp and shooting down into her pelvis.  ?She is not having side effects. Believes that Abdominal pain is  gallbladder. ? ?-----------------------------------------------------------------------------------------  ? ?Medications: ?Outpatient Medications Prior to Visit  ?Medication Sig  ? etonogestrel (NEXPLANON) 68 MG IMPL implant 1 each by Subdermal route once.  ? ibuprofen (ADVIL) 800 MG tablet Take 1 tablet (800 mg total) by mouth 3 (three) times daily.  ? Norethindrone Acetate-Ethinyl Estradiol (JUNEL 1.5/30) 1.5-30 MG-MCG tablet Take 1 tablet by mouth daily.  ? ?No facility-administered medications prior to visit.  ? ? ?Review of Systems ? ? ?  Objective  ?  ?BP 129/65   Temp 98.6 ?F (37 ?C) (Oral)   Resp 16   Wt 264 lb 1.6 oz (119.8 kg)   LMP 01/02/2022 (Exact Date)   BMI 43.28 kg/m?  ? ? ?Physical Exam ?Vitals and nursing note reviewed.  ?Constitutional:   ?   General: She is not in acute distress. ?   Appearance: Normal appearance. She is obese. She is not ill-appearing, toxic-appearing or diaphoretic.  ?HENT:  ?   Head: Normocephalic and atraumatic.  ?   Nose:  ?   Right Sinus: Frontal sinus tenderness present.  ?   Left Sinus: Frontal sinus tenderness present.  ?Cardiovascular:  ?   Rate and Rhythm: Normal rate and regular rhythm.  ?   Pulses: Normal pulses.  ?   Heart sounds: Normal heart sounds. No murmur heard. ?  No friction rub. No gallop.  ?Pulmonary:  ?   Effort: Pulmonary effort is normal. No respiratory distress.  ?   Breath sounds: Normal breath sounds. No stridor. No wheezing, rhonchi or rales.  ?Chest:  ?  Chest wall: No tenderness.  ?Abdominal:  ?   General: Bowel sounds are normal.  ?   Palpations: Abdomen is soft.  ?   Tenderness: There is abdominal tenderness.  ?Musculoskeletal:     ?   General: No swelling, tenderness, deformity or signs of injury. Normal range of motion.  ?   Right lower leg: No edema.  ?   Left lower leg: No edema.  ?Skin: ?   General: Skin is warm and dry.  ?   Capillary Refill: Capillary refill takes less than 2 seconds.  ?   Coloration: Skin is not jaundiced or pale.   ?   Findings: No bruising, erythema, lesion or rash.  ?Neurological:  ?   General: No focal deficit present.  ?   Mental Status: She is alert and oriented to person, place, and time. Mental status is at baseline.  ?   Cranial Nerves: No cranial nerve deficit.  ?   Sensory: No sensory deficit.  ?   Motor: No weakness.  ?   Coordination: Coordination normal.  ?Psychiatric:     ?   Mood and Affect: Mood normal.     ?   Behavior: Behavior normal.     ?   Thought Content: Thought content normal.     ?   Judgment: Judgment normal.  ?  ? ?Results for orders placed or performed in visit on 01/19/22  ?POCT urinalysis dipstick  ?Result Value Ref Range  ? Color, UA yellow   ? Clarity, UA clear   ? Glucose, UA Negative Negative  ? Bilirubin, UA negative   ? Ketones, UA negative   ? Spec Grav, UA 1.010 1.010 - 1.025  ? Blood, UA non hemolyzed   ? pH, UA 7.0 5.0 - 8.0  ? Protein, UA Positive (A) Negative  ? Urobilinogen, UA 1.0 0.2 or 1.0 E.U./dL  ? Nitrite, UA negative   ? Leukocytes, UA Small (1+) (A) Negative  ? Appearance    ? Odor    ? ? Assessment & Plan  ?  ? ?Problem List Items Addressed This Visit   ? ?  ? Respiratory  ? Acute non-recurrent frontal sinusitis  ?  Acute, stable ?Has been using flonase and zyrtec and benadryl ?Tenderness present ?Allergic to PCN ? ?  ?  ? Relevant Medications  ? azithromycin (ZITHROMAX) 250 MG tablet  ? doxycycline (VIBRA-TABS) 100 MG tablet  ?  ? Other  ? RUQ pain - Primary  ?  Acute, stable ?Complaints on NVD ?Will send for [place] ?Labs completed  ? ?  ?  ? Relevant Orders  ? US Abdomen Limited RUQ (LIVER/GB)  ? CBC with Differential/Platelet  ? Comprehensive Metabolic Panel (CMET)  ? Hepatic function panel  ? Urinary frequency  ?  UA positive; will send for UCx ?Plan to hold urine Abx at this time ?Pt will do AZO and cranberry and force water ?Plan to treat if UCx positive given on respiratory abx  ? ?  ?  ? Relevant Orders  ? Urine Culture  ? POCT urinalysis dipstick (Completed)   ? ? ? ?Return if symptoms worsen or fail to improve.  ?   ? ?I, Jacky Kindle, FNP, have reviewed all documentation for this visit. The documentation on 01/19/22 for the exam, diagnosis, procedures, and orders are all accurate and complete. ? ? ? ?Jacky Kindle, FNP  ?[facility] ?[phone] (phone) ?[phone] (fax) ? ?[facility] ?

## 2022-01-19 NOTE — Assessment & Plan Note (Signed)
Acute, stable ?Complaints on NVD ?Will send for [place] ?Labs completed  ?

## 2022-01-20 ENCOUNTER — Telehealth: Payer: Self-pay | Admitting: Family Medicine

## 2022-01-20 ENCOUNTER — Other Ambulatory Visit: Payer: Self-pay | Admitting: Family Medicine

## 2022-01-20 LAB — CBC WITH DIFFERENTIAL/PLATELET
Basophils Absolute: 0.1 10*3/uL (ref 0.0–0.2)
Basos: 1 %
EOS (ABSOLUTE): 1 10*3/uL — ABNORMAL HIGH (ref 0.0–0.4)
Eos: 7 %
Hematocrit: 38 % (ref 34.0–46.6)
Hemoglobin: 13 g/dL (ref 11.1–15.9)
Immature Grans (Abs): 0 10*3/uL (ref 0.0–0.1)
Immature Granulocytes: 0 %
Lymphocytes Absolute: 2.7 10*3/uL (ref 0.7–3.1)
Lymphs: 19 %
MCH: 27.7 pg (ref 26.6–33.0)
MCHC: 34.2 g/dL (ref 31.5–35.7)
MCV: 81 fL (ref 79–97)
Monocytes Absolute: 0.3 10*3/uL (ref 0.1–0.9)
Monocytes: 2 %
Neutrophils Absolute: 10.4 10*3/uL — ABNORMAL HIGH (ref 1.4–7.0)
Neutrophils: 71 %
Platelets: 388 10*3/uL (ref 150–450)
RBC: 4.69 x10E6/uL (ref 3.77–5.28)
RDW: 13.5 % (ref 11.7–15.4)
WBC: 14.6 10*3/uL — ABNORMAL HIGH (ref 3.4–10.8)

## 2022-01-20 LAB — COMPREHENSIVE METABOLIC PANEL
ALT: 16 IU/L (ref 0–32)
AST: 14 IU/L (ref 0–40)
Albumin/Globulin Ratio: 1.5 (ref 1.2–2.2)
Albumin: 4 g/dL (ref 3.9–5.0)
Alkaline Phosphatase: 82 IU/L (ref 44–121)
BUN/Creatinine Ratio: 13 (ref 9–23)
BUN: 9 mg/dL (ref 6–20)
Bilirubin Total: 0.7 mg/dL (ref 0.0–1.2)
CO2: 20 mmol/L (ref 20–29)
Calcium: 9.4 mg/dL (ref 8.7–10.2)
Chloride: 105 mmol/L (ref 96–106)
Creatinine, Ser: 0.72 mg/dL (ref 0.57–1.00)
Globulin, Total: 2.7 g/dL (ref 1.5–4.5)
Glucose: 94 mg/dL (ref 70–99)
Potassium: 4 mmol/L (ref 3.5–5.2)
Sodium: 140 mmol/L (ref 134–144)
Total Protein: 6.7 g/dL (ref 6.0–8.5)
eGFR: 115 mL/min/{1.73_m2} (ref >59)

## 2022-01-20 LAB — HEPATIC FUNCTION PANEL: Bilirubin, Direct: 0.2 mg/dL (ref 0.00–0.40)

## 2022-01-20 NOTE — Telephone Encounter (Signed)
Pt stated she spoke with Robynn Pane about sending some allergy medication to the pharmacy as well / but nothing for allergies was sent to  ?[id number] - [place], [place] - [address] AT [facility] [address] [facility] Phone:  [phone]  ?Fax:  [phone]  ?  ?Pt would still like an RX for her allergies / please advise  ?

## 2022-01-22 LAB — URINE CULTURE

## 2022-01-22 NOTE — Telephone Encounter (Signed)
Patient advised.

## 2022-01-30 ENCOUNTER — Ambulatory Visit: Payer: Self-pay

## 2022-01-30 ENCOUNTER — Ambulatory Visit: Payer: Medicaid Other

## 2022-01-30 NOTE — Telephone Encounter (Signed)
  Chief Complaint: Cough Symptoms: Cough, BA, HA Frequency: since yesterday Pertinent Negatives: Patient denies SOB, Disposition: [] ED /[] Urgent Care (no appt availability in office) / [] Appointment(In office/virtual)/ []  [place] [facility]/ [x] Home Care/ [] Refused Recommended Disposition /[] [place] [facility]/ []  Follow-up with PCP Additional Notes: Pt would like cough medicine called in. PT unable to come to ofice and says that virtual care has not worked for her in the past. Pt will go to UC if needed.    Summary: cough and medication   Pt asked if cough medication can be called in for her / she has a cough and it is caused her to have a sore throat and tonsil is swollen / please advise      Reason for Disposition  Cough with cold symptoms (e.g., runny nose, postnasal drip, throat clearing)  Answer Assessment - Initial Assessment Questions 1. ONSET: "When did the cough begin?"      yesterday 2. SEVERITY: "How bad is the cough today?"      Coughing a lot 3. SPUTUM: "Describe the color of your sputum" (none, dry cough; clear, white, yellow, green)     clear 4. HEMOPTYSIS: "Are you coughing up any blood?" If so ask: "How much?" (flecks, streaks, tablespoons, etc.)     no 5. DIFFICULTY BREATHING: "Are you having difficulty breathing?" If Yes, ask: "How bad is it?" (e.g., mild, moderate, severe)    - MILD: No SOB at rest, mild SOB with walking, speaks normally in sentences, can lie down, no retractions, pulse < 100.    - MODERATE: SOB at rest, SOB with minimal exertion and prefers to sit, cannot lie down flat, speaks in phrases, mild retractions, audible wheezing, pulse 100-120.    - SEVERE: Very SOB at rest, speaks in single words, struggling to breathe, sitting hunched forward, retractions, pulse > 120      no 6. FEVER: "Do you have a fever?" If Yes, ask: "What is your temperature, how was it measured, and when did it start?"     possibly 7. CARDIAC HISTORY: "Do you have any  history of heart disease?" (e.g., heart attack, congestive heart failure)      no 8. LUNG HISTORY: "Do you have any history of lung disease?"  (e.g., pulmonary embolus, asthma, emphysema)     no 9. PE RISK FACTORS: "Do you have a history of blood clots?" (or: recent major surgery, recent prolonged travel, bedridden)     no 10. OTHER SYMPTOMS: "Do you have any other symptoms?" (e.g., runny nose, wheezing, chest pain)       HA, BA 11. PREGNANCY: "Is there any chance you are pregnant?" "When was your last menstrual period?"       na 12. TRAVEL: "Have you traveled out of the country in the last month?" (e.g., travel history, exposures)       na  Protocols used: Cough - Acute Productive-A-AH

## 2022-01-30 NOTE — Telephone Encounter (Signed)
  Chief Complaint: Fever 100.7 Symptoms: congestion Frequency: unknown Pertinent Negatives: Patient denies  Disposition: [] ED /[] Urgent Care (no appt availability in office) / [] Appointment(In office/virtual)/ []  [place] [facility]/ [] Home Care/ [] Refused Recommended Disposition /[] [place] [facility]/ [x]  Follow-up with PCP Additional Notes: Pt called to see if she should cancel appt today.  Pt was unsure of where she should be going for , nd did not have phone number.  RN called [facility] out pt imaging, and spoke to . will cancel appt and call pt to reschedule. Summary: Advice   Pt is calling bc this has has a morning however has been running a temp of 100.7. Pt would like to know should she still come for US/?      Reason for Disposition  Health Information question, no triage required and triager able to answer question  Answer Assessment - Initial Assessment Questions 1. REASON FOR CALL or QUESTION: "What is your reason for calling today?" or "How can I best help you?" or "What question do you have that I can help answer?"     Should she go to [place] appt today?  Protocols used: Information Only Call - No Triage-A-AH

## 2022-01-30 NOTE — Telephone Encounter (Signed)
Patient was advised.  

## 2022-01-30 NOTE — Telephone Encounter (Signed)
Noted  

## 2022-02-03 ENCOUNTER — Encounter: Payer: Self-pay | Admitting: Family Medicine

## 2022-02-03 ENCOUNTER — Ambulatory Visit (INDEPENDENT_AMBULATORY_CARE_PROVIDER_SITE_OTHER): Payer: Medicaid Other | Admitting: Family Medicine

## 2022-02-03 VITALS — BP 113/76 | HR 111 | Temp 99.0°F | Resp 20 | Wt 250.5 lb

## 2022-02-03 DIAGNOSIS — J189 Pneumonia, unspecified organism: Secondary | ICD-10-CM | POA: Diagnosis not present

## 2022-02-03 MED ORDER — CEPHALEXIN 500 MG PO CAPS: 500.0000 mg | ORAL_CAPSULE | Freq: Two times a day (BID) | ORAL | 0 refills | Status: AC

## 2022-02-03 NOTE — Progress Notes (Signed)
I,Sulibeya S Dimas,acting as a Education administrator for Lavon Paganini, MD.,have documented all relevant documentation on the behalf of Lavon Paganini, MD,as directed by  Lavon Paganini, MD while in the presence of Lavon Paganini, MD.   Established patient visit   Patient: Crystal Hansen   DOB: 03-29-1991   31 y.o. Female  MRN: [id number] Visit Date: 02/03/2022  Today's healthcare provider: Lavon Paganini, MD   Chief Complaint  Patient presents with   Follow-up   Subjective    HPI  Follow up ER visit  Patient was seen in ER for URI symptoms on 02/01/22. She was treated for pneumonia. Treatment for this included 7 day course of cefpodoxime.  She reports she was unable to get medication due to insurance not covering medication.  She reports this condition is Unchanged.  -----------------------------------------------------------------------------------------   Medications: Outpatient Medications Prior to Visit  Medication Sig   ibuprofen (ADVIL) 800 MG tablet Take 1 tablet (800 mg total) by mouth 3 (three) times daily.   Norethindrone Acetate-Ethinyl Estradiol (JUNEL 1.5/30) 1.5-30 MG-MCG tablet Take 1 tablet by mouth daily.   etonogestrel (NEXPLANON) 68 MG IMPL implant 1 each by Subdermal route once. (Patient not taking: Reported on 02/03/2022)   [DISCONTINUED] doxycycline (VIBRA-TABS) 100 MG tablet Take 1 tablet (100 mg total) by mouth 2 (two) times daily. (Patient not taking: Reported on 02/03/2022)   No facility-administered medications prior to visit.    Review of Systems  Constitutional:  Negative for chills and fever.  Respiratory:  Positive for cough, shortness of breath and wheezing.   Psychiatric/Behavioral:  Positive for sleep disturbance.    Last CBC Lab Results  Component Value Date   WBC 14.6 (H) 01/19/2022   HGB 13.0 01/19/2022   HCT 38.0 01/19/2022   MCV 81 01/19/2022   MCH 27.7 01/19/2022   RDW 13.5 01/19/2022   PLT 388 79/89/2119   Last  metabolic panel Lab Results  Component Value Date   GLUCOSE 94 01/19/2022   NA 140 01/19/2022   K 4.0 01/19/2022   CL 105 01/19/2022   CO2 20 01/19/2022   BUN 9 01/19/2022   CREATININE 0.72 01/19/2022   EGFR 115 01/19/2022   CALCIUM 9.4 01/19/2022   PROT 6.7 01/19/2022   ALBUMIN 4.0 01/19/2022   LABGLOB 2.7 01/19/2022   AGRATIO 1.5 01/19/2022   BILITOT 0.7 01/19/2022   ALKPHOS 82 01/19/2022   AST 14 01/19/2022   ALT 16 01/19/2022   ANIONGAP 14 05/03/2020   Last lipids Lab Results  Component Value Date   CHOL 175 03/24/2021   HDL 35 (L) 03/24/2021   LDLCALC 117 (H) 03/24/2021   TRIG 125 03/24/2021   CHOLHDL 5.0 (H) 03/24/2021       Objective    BP 113/76 (BP Location: Left Arm, Patient Position: Sitting, Cuff Size: Large)   Pulse (!) 111   Temp 99 F (37.2 C) (Oral)   Resp 20   Wt 250 lb 8 oz (113.6 kg)   LMP 01/27/2022 (Approximate)   SpO2 96%   BMI 41.05 kg/m  BP Readings from Last 3 Encounters:  02/03/22 113/76  01/19/22 129/65  01/08/22 124/84   Wt Readings from Last 3 Encounters:  02/03/22 250 lb 8 oz (113.6 kg)  01/19/22 264 lb 1.6 oz (119.8 kg)  01/08/22 260 lb 12.8 oz (118.3 kg)      Physical Exam Vitals reviewed.  Constitutional:      General: She is not in acute distress.    Appearance:  Normal appearance. She is well-developed. She is not diaphoretic.  HENT:     Head: Normocephalic and atraumatic.  Eyes:     General: No scleral icterus.    Conjunctiva/sclera: Conjunctivae normal.  Neck:     Thyroid: No thyromegaly.  Cardiovascular:     Rate and Rhythm: Normal rate and regular rhythm.     Pulses: Normal pulses.     Heart sounds: Normal heart sounds. No murmur heard. Pulmonary:     Effort: Pulmonary effort is normal. No respiratory distress.     Breath sounds: Rhonchi present. No wheezing or rales.  Musculoskeletal:     Cervical back: Neck supple.     Right lower leg: No edema.     Left lower leg: No edema.  Lymphadenopathy:      Cervical: No cervical adenopathy.  Skin:    General: Skin is warm and dry.     Findings: No rash.  Neurological:     Mental Status: She is alert and oriented to person, place, and time. Mental status is at baseline.  Psychiatric:        Mood and Affect: Mood normal.        Behavior: Behavior normal.      No results found for any visits on 02/03/22.  Assessment & Plan     1. Community acquired pneumonia, unspecified laterality - noted on CT in ER - she continues to have fever, cough, SOB - she has been unable to get cefpodoxime from the pharmacy - she already had macrolide treatment for sinusitis - will treat with keflex instead  - reviewed imaging from ER - discussed benign finding of small renal cyst  Meds ordered this encounter  Medications   cephALEXin (KEFLEX) 500 MG capsule    Sig: Take 1 capsule (500 mg total) by mouth 2 (two) times daily for 7 days.    Dispense:  14 capsule    Refill:  0     No follow-ups on file.      I, Lavon Paganini, MD, have reviewed all documentation for this visit. The documentation on 02/03/22 for the exam, diagnosis, procedures, and orders are all accurate and complete.   Nazire Fruth, Dionne Bucy, MD, MPH [facility]

## 2022-02-19 IMAGING — CT CT ABD-PELV W/O CM
2 of 4 series · 17 of 46 positions shown, 19 images · non-contrast
Comparison: None.

CLINICAL DATA: Prior C-section.  Draining from incision.

EXAM:
CT ABDOMEN AND PELVIS WITHOUT CONTRAST
TECHNIQUE: Multidetector CT imaging of the abdomen and pelvis was performed
following the standard protocol without IV contrast.

[Series 2: routine abd/pel wo · axial · 0.98mm/px · z∈[-1172,-698]mm · 14 of 105 slices shown, 16 images]
[im 5/105  soft-tissue]
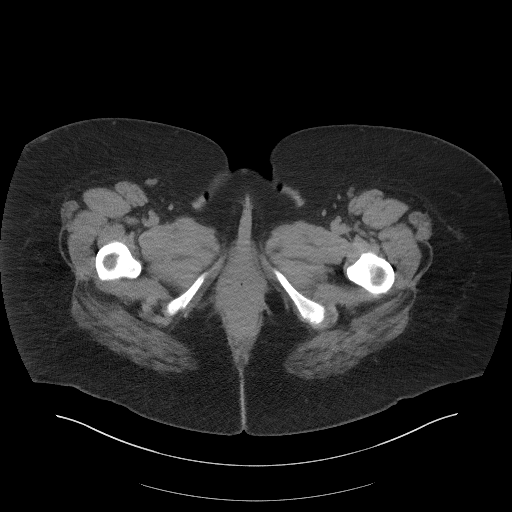
[im 5/105  bone]
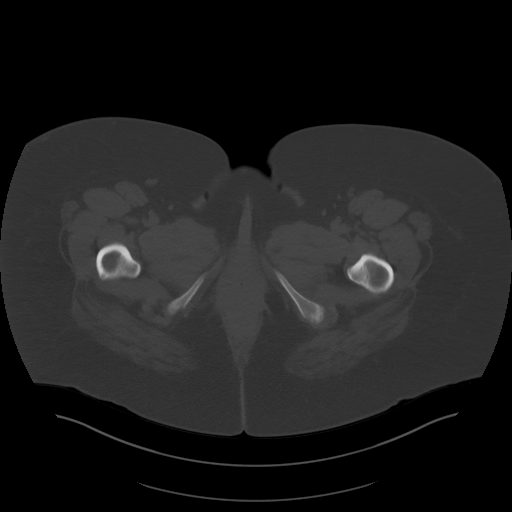
[im 14/105  soft-tissue]
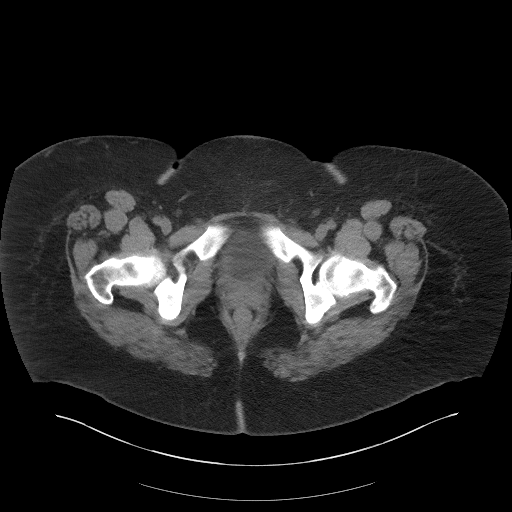
[im 19/105  soft-tissue]
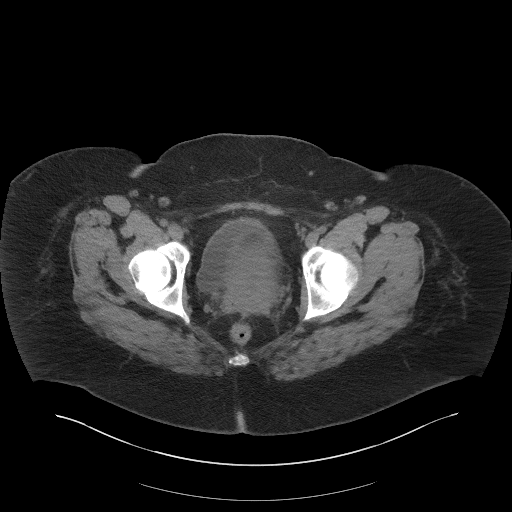
[im 28/105  soft-tissue]
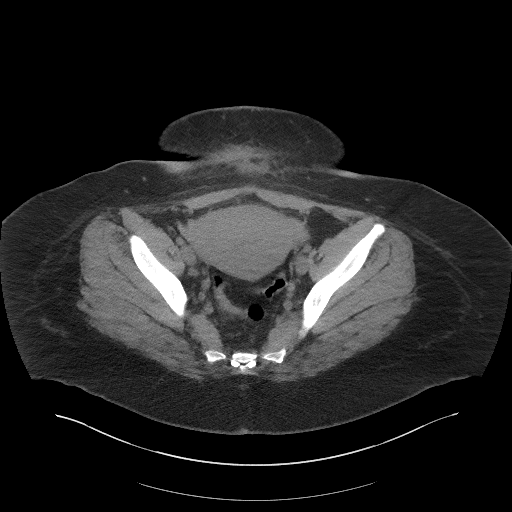
[im 37/105  soft-tissue]
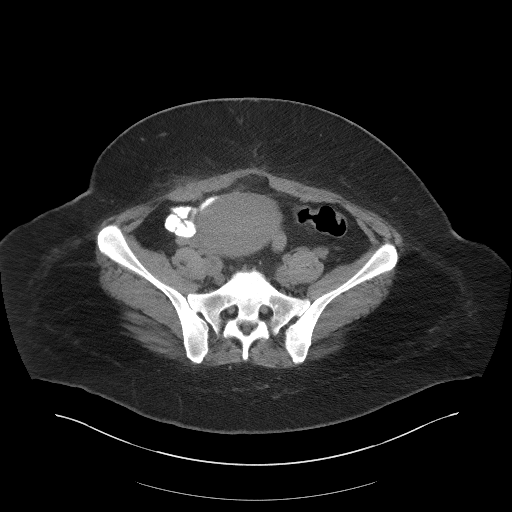
[im 41/105  soft-tissue]
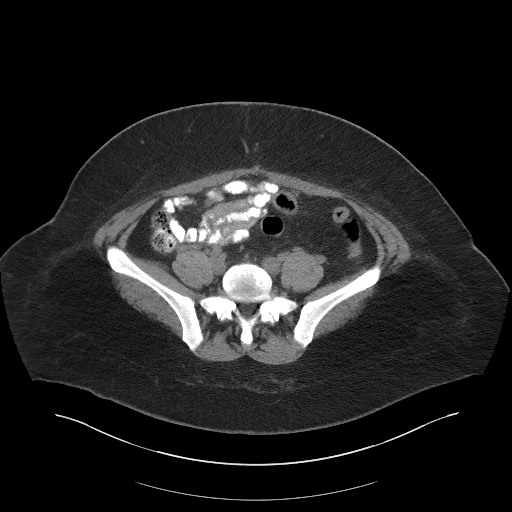
[im 50/105  soft-tissue]
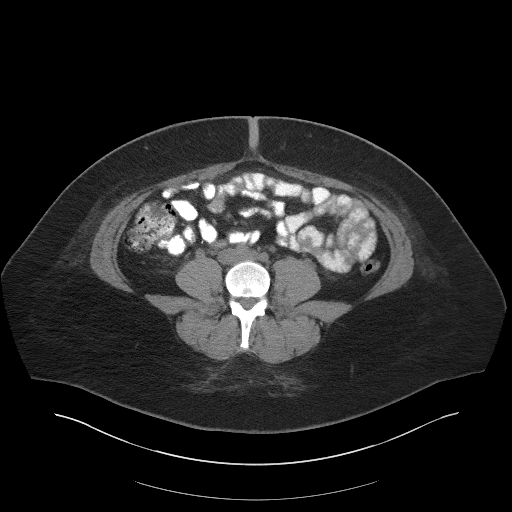
[im 55/105  soft-tissue]
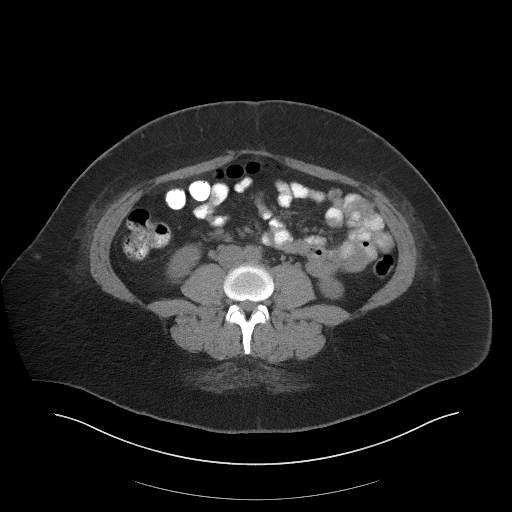
[im 64/105  soft-tissue]
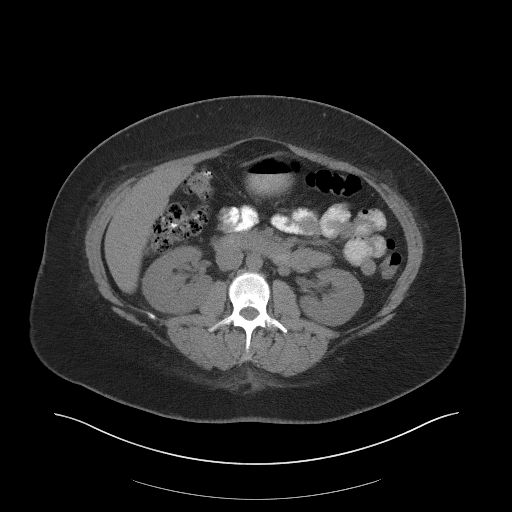
[im 64/105  bone]
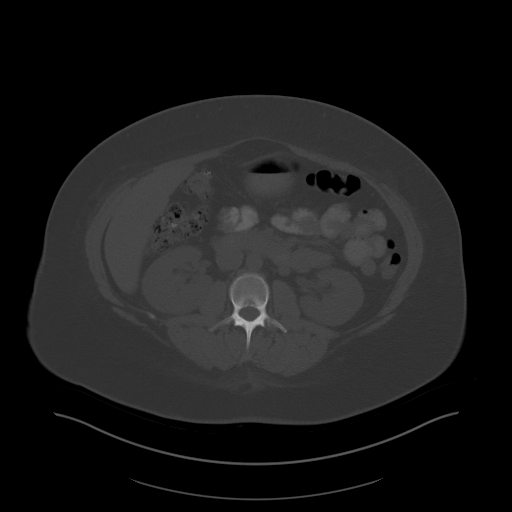
[im 68/105  soft-tissue]
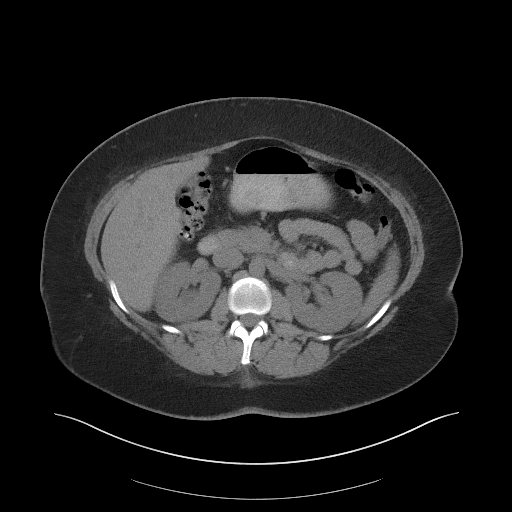
[im 77/105  soft-tissue]
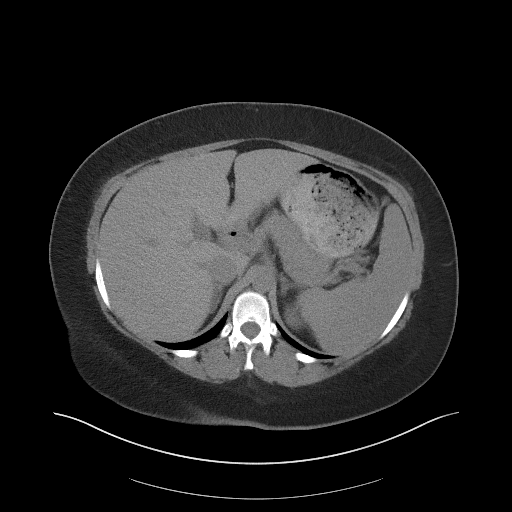
[im 86/105  soft-tissue]
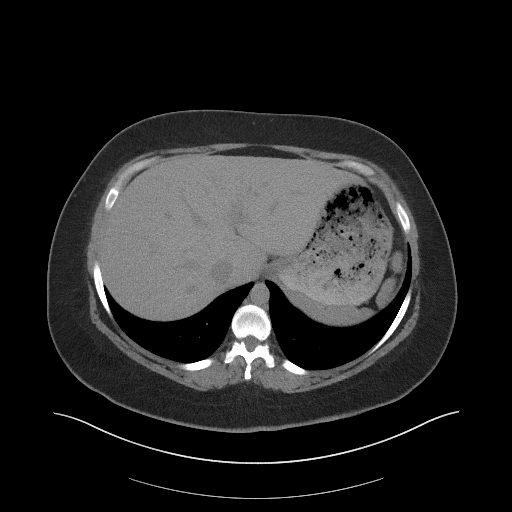
[im 91/105  soft-tissue]
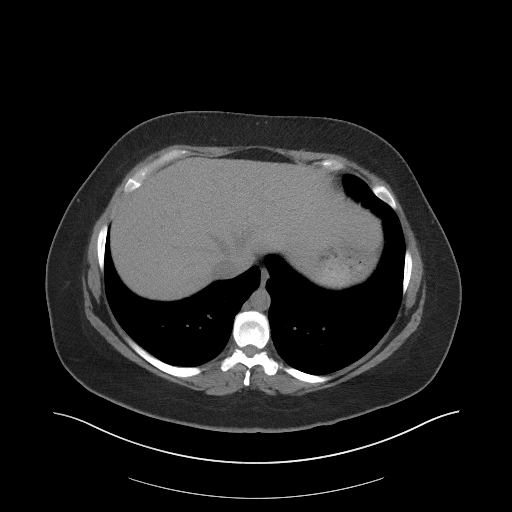
[im 100/105  soft-tissue]
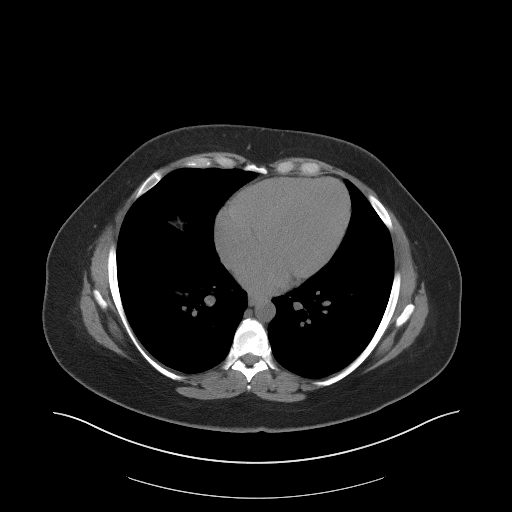

[Series 5: coronal st · coronal · 0.99mm/px · 3 of 104 slices shown]
[im 35/104  soft-tissue]
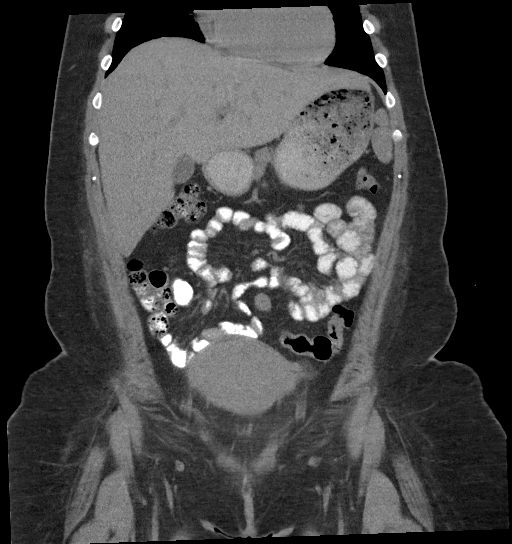
[im 46/104  soft-tissue]
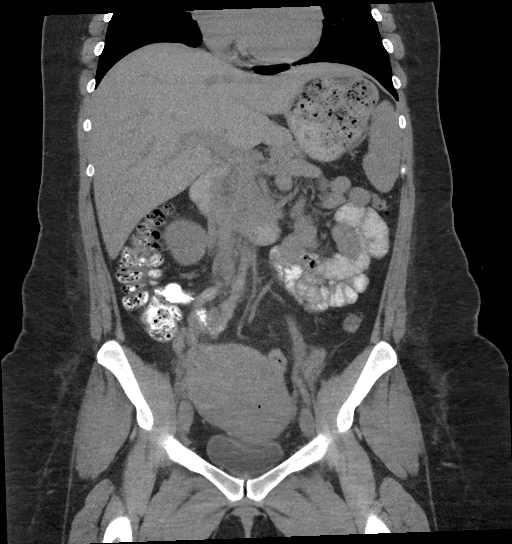
[im 58/104  soft-tissue]
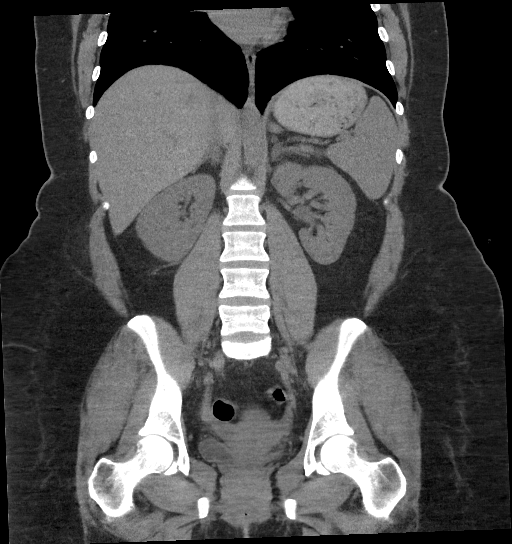

[17 of 46 positions shown; findings below may reference images not displayed]

FINDINGS: Lower chest: Lung bases are clear. No effusions. Heart is normal
size.

Hepatobiliary: No focal hepatic abnormality. Gallbladder
unremarkable.

Pancreas: No focal abnormality or ductal dilatation.

Spleen: No focal abnormality.  Normal size.

Adrenals/Urinary Tract: No adrenal abnormality. No focal renal
abnormality. No stones or hydronephrosis. Urinary bladder is
unremarkable.

Stomach/Bowel: Stomach, large and small bowel grossly unremarkable.
Normal appendix.

Vascular/Lymphatic: No evidence of aneurysm or adenopathy.

Reproductive: Small amount of gas noted within uterus, likely
endometrium, likely related to recent C-section. Uterus is prominent
compatible with recent postpartum state.

Other: No free fluid or free air. Stranding in the subcutaneous soft
tissues in the lower abdominal wall related to recent C-section. No
focal fluid collection.

Musculoskeletal: No acute bony abnormality.
IMPRESSION: Changes of recent C-section with stranding in the anterior abdominal
wall. No focal fluid collection.

## 2022-03-12 ENCOUNTER — Encounter: Payer: Medicaid Other | Admitting: Psychology

## 2022-03-27 ENCOUNTER — Encounter: Payer: Medicaid Other | Admitting: Family Medicine

## 2022-04-01 ENCOUNTER — Ambulatory Visit (INDEPENDENT_AMBULATORY_CARE_PROVIDER_SITE_OTHER): Payer: Medicaid Other | Admitting: Family Medicine

## 2022-04-01 ENCOUNTER — Telehealth: Payer: Self-pay

## 2022-04-01 ENCOUNTER — Encounter: Payer: Self-pay | Admitting: Family Medicine

## 2022-04-01 ENCOUNTER — Other Ambulatory Visit (HOSPITAL_COMMUNITY)
Admission: RE | Admit: 2022-04-01 | Discharge: 2022-04-01 | Disposition: A | Discharge status: Home / Self Care | Payer: Medicaid Other | Source: Ambulatory Visit | Attending: Family Medicine | Admitting: Family Medicine

## 2022-04-01 VITALS — BP 103/68 | HR 79 | Resp 20 | Ht 65.5 in | Wt 259.0 lb

## 2022-04-01 DIAGNOSIS — Z113 Encounter for screening for infections with a predominantly sexual mode of transmission: Secondary | ICD-10-CM | POA: Insufficient documentation

## 2022-04-01 DIAGNOSIS — F4323 Adjustment disorder with mixed anxiety and depressed mood: Secondary | ICD-10-CM

## 2022-04-01 DIAGNOSIS — Z0000 Encounter for general adult medical examination without abnormal findings: Secondary | ICD-10-CM

## 2022-04-01 DIAGNOSIS — N921 Excessive and frequent menstruation with irregular cycle: Secondary | ICD-10-CM

## 2022-04-01 DIAGNOSIS — J3089 Other allergic rhinitis: Secondary | ICD-10-CM | POA: Diagnosis not present

## 2022-04-01 DIAGNOSIS — N9089 Other specified noninflammatory disorders of vulva and perineum: Secondary | ICD-10-CM

## 2022-04-01 DIAGNOSIS — N393 Stress incontinence (female) (male): Secondary | ICD-10-CM | POA: Diagnosis not present

## 2022-04-01 NOTE — Assessment & Plan Note (Signed)
Appears consistent with angiokeratoma, reassurance provided.

## 2022-04-01 NOTE — Assessment & Plan Note (Signed)
-   Referred to pelvic floor therapy

## 2022-04-01 NOTE — Assessment & Plan Note (Signed)
Doing well with coping mechanisms, declines therapy.

## 2022-04-01 NOTE — Assessment & Plan Note (Signed)
Worse with nexplanon, refractory to OCPs. Previously normal internal exam. Refer to GYN for evaluation, TVUS, and discussion of options, desiring hysterectomy.

## 2022-04-01 NOTE — Telephone Encounter (Signed)
Noted  

## 2022-04-01 NOTE — Telephone Encounter (Signed)
Copied from CRM [id number]. Topic: General - Inquiry >> Apr 01, 2022  8:35 AM De Blanch wrote: Reason for CRM:Pt stated she is running behind for the appt. Pt made aware of late policy

## 2022-04-01 NOTE — Progress Notes (Signed)
BP 103/68   Pulse 79   Resp 20   Ht 5' 5.5" (1.664 m)   Wt 259 lb (117.5 kg)   LMP 03/06/2022 (Approximate) Comment: irregular  SpO2 99%   Breastfeeding No   BMI 42.44 kg/m    Subjective:    Patient ID: Crystal Hansen, female    DOB: 1991/04/17, 31 y.o.   MRN: [id number]  HPI: Crystal Hansen is a 31 y.o. female presenting on 04/01/2022 for comprehensive medical examination. Current medical complaints include: bumps on labia  Bumps on labia - noticing for the past month. Occasionally itchy, not painful. Slightly dark in color. Not red or oozing. Wants testing for STI.  Adjustment d/o with anxiety/depression - previously on zoloft. Not on meds currently. Doing well. Journals and smokes MJ for coping.   Dysmenorrhea - nexplanon placed August 2021 after birth of twins. With subsequent menorrhagia. Trialed OCPs which worked some but no longer helping, stopped taking.  Not interested in IUD, wants to see GYN about hysterectomy, done having kids.    Depression Screen done today and results listed below:     04/01/2022    9:09 AM 02/03/2022   10:26 AM 01/08/2022   10:43 AM 09/30/2021    9:24 AM 06/30/2021   12:57 PM  Depression screen PHQ 2/9  Decreased Interest 0 0 1 1 0  Down, Depressed, Hopeless 0 1 1 0 0  PHQ - 2 Score 0 1 2 1  0  Altered sleeping 0 0 1 1 0  Tired, decreased energy 1 1 1 1  0  Change in appetite 1 2 1  0 1  Feeling bad or failure about yourself  0 2 1 0 0  Trouble concentrating 0 0 1 0 0  Moving slowly or fidgety/restless 0 0 0 0 0  Suicidal thoughts 0 0 0 0 0  PHQ-9 Score 2 6 7 3 1   Difficult doing work/chores Not difficult at all Not difficult at all Somewhat difficult Not difficult at all Not difficult at all    Past Medical History:  Past Medical History:  Diagnosis Date   Allergy    Anxiety    Arthritis    Cesarean wound infection 05/03/2020   Headache     Surgical History:  Past Surgical History:  Procedure Laterality Date    APPENDECTOMY     CESAREAN SECTION MULTI-GESTATIONAL  04/16/2020   Procedure: CESAREAN SECTION MULTI-GESTATIONAL;  Surgeon: , MD;  Location: [facility] ORS;  Service: Obstetrics;;   WISDOM TOOTH EXTRACTION      Medications:  Current Outpatient Medications on File Prior to Visit  Medication Sig   cetirizine (ZYRTEC) 10 MG tablet Take 10 mg by mouth as needed for allergies.   etonogestrel (NEXPLANON) 68 MG IMPL implant 1 each by Subdermal route once.   ibuprofen (ADVIL) 800 MG tablet Take 1 tablet (800 mg total) by mouth 3 (three) times daily. (Patient taking differently: Take 800 mg by mouth every 8 (eight) hours as needed.)   No current facility-administered medications on file prior to visit.    Allergies:  Allergies  Allergen Reactions   Peanut Oil Anaphylaxis   Shellfish Allergy Anaphylaxis   Amoxapine And Related    Apple Juice Other (See Comments)   Amoxicillin Rash    Social History:  Social History   Socioeconomic History   Marital status: Divorced    Spouse name: Not on file   Number of children: 3   Years of education: Not on file  Highest education level: Not on file  Occupational History   Occupation: stay at home mom  Tobacco Use   Smoking status: Former    Packs/day: 0.50    Years: 13.00    Total pack years: 6.50    Types: Cigarettes    Quit date: 09/09/2019    Years since quitting: 2.5   Smokeless tobacco: Never  Vaping Use   Vaping Use: Never used  Substance and Sexual Activity   Alcohol use: Yes    Alcohol/week: 3.0 - 4.0 standard drinks of alcohol    Types: 3 - 4 Standard drinks or equivalent per week    Comment: on occasion   Drug use: Yes    Types: Marijuana   Sexual activity: Yes    Partners: Male    Birth control/protection: Implant  Other Topics Concern   Not on file  Social History Narrative   Recently had twins    Right Handed   Lives in a one story hom e   Social Determinants of Health   Financial Resource Strain:  Not on file  Food Insecurity: Not on file  Transportation Needs: Not on file  Physical Activity: Not on file  Stress: Not on file  Social Connections: Not on file  Intimate Partner Violence: Not on file   Social History   Tobacco Use  Smoking Status Former   Packs/day: 0.50   Years: 13.00   Total pack years: 6.50   Types: Cigarettes   Quit date: 09/09/2019   Years since quitting: 2.5  Smokeless Tobacco Never   Social History   Substance and Sexual Activity  Alcohol Use Yes   Alcohol/week: 3.0 - 4.0 standard drinks of alcohol   Types: 3 - 4 Standard drinks or equivalent per week   Comment: on occasion    Family History:  Family History  Problem Relation Age of Onset   Diabetes Mother    Neuropathy Mother    Kidney disease Mother    Heart disease Mother    Cataracts Mother    Eczema Father    Heart Problems Maternal Aunt    Cancer Maternal Grandmother        Mouth Cancer    Heart disease Maternal Grandfather    Diabetes Maternal Grandfather    Cataracts Maternal Grandfather    Anemia Paternal Grandmother    Dementia Paternal Grandmother    Colon cancer Paternal Grandfather    Breast cancer Neg Hx     Past medical history, surgical history, medications, allergies, family history and social history reviewed with patient today and changes made to appropriate areas of the chart.      Objective:    BP 103/68   Pulse 79   Resp 20   Ht 5' 5.5" (1.664 m)   Wt 259 lb (117.5 kg)   LMP 03/06/2022 (Approximate) Comment: irregular  SpO2 99%   Breastfeeding No   BMI 42.44 kg/m   Wt Readings from Last 3 Encounters:  04/01/22 259 lb (117.5 kg)  02/03/22 250 lb 8 oz (113.6 kg)  01/19/22 264 lb 1.6 oz (119.8 kg)    Physical Exam Vitals reviewed. Exam conducted with a chaperone present.  HENT:     Head: Normocephalic.     Right Ear: External ear normal.     Left Ear: External ear normal.     Nose: Nose normal.     Mouth/Throat:     Mouth: Mucous membranes are  moist.     Pharynx: Oropharynx is clear.  Eyes:     Extraocular Movements: Extraocular movements intact.     Pupils: Pupils are equal, round, and reactive to light.  Cardiovascular:     Rate and Rhythm: Normal rate and regular rhythm.     Heart sounds: Normal heart sounds. No murmur heard. Pulmonary:     Effort: Pulmonary effort is normal.     Breath sounds: Normal breath sounds.  Abdominal:     General: Bowel sounds are normal.     Palpations: Abdomen is soft.  Genitourinary:    Comments: Few darker flesh colored papules to b/l labia majora Musculoskeletal:        General: Normal range of motion.     Cervical back: Normal range of motion.     Right lower leg: No edema.     Left lower leg: No edema.  Lymphadenopathy:     Cervical: No cervical adenopathy.  Skin:    General: Skin is warm and dry.  Neurological:     Mental Status: She is alert and oriented to person, place, and time. Mental status is at baseline.  Psychiatric:        Mood and Affect: Mood normal.        Behavior: Behavior normal.     Results for orders placed or performed in visit on 01/20/22  Urine Culture  Result Value Ref Range   Urine Culture, Routine Final report    Organism ID, Bacteria Comment       Assessment & Plan:   Problem List Items Addressed This Visit       Respiratory   Allergic rhinitis    Well controlled with zyrtec, continue.        Genitourinary   Labial lesion    Appears consistent with angiokeratoma, reassurance provided.         Other   Adjustment disorder with mixed anxiety and depressed mood    Doing well with coping mechanisms, declines therapy.       Menorrhagia with irregular cycle    Worse with nexplanon, refractory to OCPs. Previously normal internal exam. Refer to GYN for evaluation, TVUS, and discussion of options, desiring hysterectomy.      Relevant Orders   Ambulatory referral to Gynecology   Morbid obesity (HCC)   SUI (stress urinary incontinence,  female)    Referred to pelvic floor therapy.      Relevant Orders   Ambulatory referral to Physical Therapy   Other Visit Diagnoses     Routine screening for STI (sexually transmitted infection)    -  Primary   Relevant Orders   RPR   Hepatitis C antibody   HIV antibody (with reflex)   Cervicovaginal ancillary only   Annual physical exam       Relevant Orders   Lipid panel   Hemoglobin A1c        Follow up plan: Return in about 1 year (around 04/02/2023) for cpe.   LABORATORY TESTING:  - Pap smear: up to date  IMMUNIZATIONS:   - Tdap: Tetanus vaccination status reviewed: last tetanus booster within 10 years. - Influenza: Postponed to flu season - Pneumovax: Not applicable - Prevnar: Not applicable - HPV: Not applicable - Shingrix vaccine: Not applicable - COVID vaccine: refused  SCREENING: - Mammogram: Not applicable  - Colonoscopy: Not applicable  - Bone Density: Not applicable  - Lung Cancer Screening: Not applicable   Hep C Screening: UTD STD testing and prevention (HIV/chl/gon/syphilis): done today Sexual History : monogamous Menstrual History/LMP/Abnormal Bleeding: see  above Incontinence Symptoms: stress incontinence  Osteoporosis: Discussed high calcium and vitamin D supplementation, weight bearing exercises  Advanced Care Planning: A voluntary discussion about advance care planning including the explanation and discussion of advance directives.  Discussed health care proxy and Living will, and the patient was able to identify a health care proxy as mom, Iver Fehrenbach.  Patient does not have a living will at present time. If patient does have living will, I have requested they bring this to the clinic to be scanned in to their chart.  PATIENT COUNSELING:   Advised to take 1 mg of folate supplement per day if capable of pregnancy.   Sexuality: Discussed sexually transmitted diseases, partner selection, use of condoms, avoidance of unintended  pregnancy  and contraceptive alternatives.   Advised to avoid cigarette smoking.  I discussed with the patient that most people either abstain from alcohol or drink within safe limits (<=14/week and <=4 drinks/occasion for males, <=7/weeks and <= 3 drinks/occasion for females) and that the risk for alcohol disorders and other health effects rises proportionally with the number of drinks per week and how often a drinker exceeds daily limits.  Discussed cessation/primary prevention of drug use and availability of treatment for abuse.   Diet: Encouraged to adjust caloric intake to maintain  or achieve ideal body weight, to reduce intake of dietary saturated fat and total fat, to limit sodium intake by avoiding high sodium foods and not adding table salt, and to maintain adequate dietary potassium and calcium preferably from fresh fruits, vegetables, and low-fat dairy products.    stressed the importance of regular exercise  Injury prevention: Discussed safety belts, safety helmets, smoke detector, smoking near bedding or upholstery.   Dental health: Discussed importance of regular tooth brushing, flossing, and dental visits.    NEXT PREVENTATIVE PHYSICAL DUE IN 1 YEAR. Return in about 1 year (around 04/02/2023) for cpe.

## 2022-04-01 NOTE — Assessment & Plan Note (Signed)
Well controlled with zyrtec, continue.

## 2022-04-02 ENCOUNTER — Telehealth: Payer: Self-pay | Admitting: Family Medicine

## 2022-04-02 ENCOUNTER — Other Ambulatory Visit: Payer: Self-pay | Admitting: Family Medicine

## 2022-04-02 LAB — CERVICOVAGINAL ANCILLARY ONLY
Chlamydia: NEGATIVE
Comment: NEGATIVE
Comment: NEGATIVE
Comment: NORMAL
Neisseria Gonorrhea: NEGATIVE
Trichomonas: NEGATIVE

## 2022-04-02 LAB — LIPID PANEL
Chol/HDL Ratio: 4.2 ratio (ref 0.0–4.4)
Cholesterol, Total: 157 mg/dL (ref 100–199)
HDL: 37 mg/dL — ABNORMAL LOW (ref >39)
LDL Chol Calc (NIH): 106 mg/dL — ABNORMAL HIGH (ref 0–99)
Triglycerides: 74 mg/dL (ref 0–149)
VLDL Cholesterol Cal: 14 mg/dL (ref 5–40)

## 2022-04-02 LAB — HIV ANTIBODY (ROUTINE TESTING W REFLEX): HIV Screen 4th Generation wRfx: NONREACTIVE

## 2022-04-02 LAB — HEMOGLOBIN A1C
Est. average glucose Bld gHb Est-mCnc: 100 mg/dL
Hgb A1c MFr Bld: 5.1 % (ref 4.8–5.6)

## 2022-04-02 LAB — RPR: RPR Ser Ql: NONREACTIVE

## 2022-04-02 LAB — HEPATITIS C ANTIBODY: Hep C Virus Ab: NONREACTIVE

## 2022-04-02 MED ORDER — CETIRIZINE HCL 10 MG PO TABS
10.0000 mg | ORAL_TABLET | ORAL | 3 refills | Status: DC | PRN
Start: 2022-04-02 — End: 2023-06-29

## 2022-04-02 NOTE — Telephone Encounter (Signed)
Medication Refill - Medication: cetirizine (ZYRTEC) 10 MG tablet  Has the patient contacted their pharmacy? Yes.   (Agent: If no, request that the patient contact the pharmacy for the refill. If patient does not wish to contact the pharmacy document the reason why and proceed with request.) (Agent: If yes, when and what did the pharmacy advise?)  Preferred Pharmacy (with phone number or street name):  [id number] [place], [place] - [address] AT [facility] OF [address]  [address] [place] [address]  Phone: [phone] Fax: [phone]  Hours: Not open 24 hours   Has the patient been seen for an appointment in the last year OR does the patient have an upcoming appointment? Yes.   Pt had appt yesterday and this was supposed to be restarted, looks as in process  Agent: Please be advised that RX refills may take up to 3 business days. We ask that you follow-up with your pharmacy.   Pls complete pt has been calling pharmacy today

## 2022-04-03 NOTE — Telephone Encounter (Signed)
Patient advised.

## 2022-05-04 ENCOUNTER — Ambulatory Visit (INDEPENDENT_AMBULATORY_CARE_PROVIDER_SITE_OTHER): Payer: Medicaid Other | Admitting: Obstetrics & Gynecology

## 2022-05-04 VITALS — BP 122/70 | Wt 260.2 lb

## 2022-05-04 DIAGNOSIS — N921 Excessive and frequent menstruation with irregular cycle: Secondary | ICD-10-CM | POA: Diagnosis not present

## 2022-05-04 NOTE — Progress Notes (Signed)
Subjective:     Crystal Hansen is a 31 y.o. female here for a routine exam.  Current complaints: menorrhagia w/reg cycle.  Personal health questionnaire reviewed: yes.   Gynecologic History Patient's last menstrual period was 04/25/2022 (exact date). Contraception: Nexplanon Last Pap: 10/23/2019. Results were: normal Last mammogram: undocumented.   Obstetric History OB History  Gravida Para Term Preterm AB Living  2 2 2     3   SAB IAB Ectopic Multiple Live Births        1 3    # Outcome Date GA Lbr Len/2nd Weight Sex Delivery Anes PTL Lv  2A Term 04/16/20 [redacted]w[redacted]d  6 lb 12.1 oz (3.065 kg) M CS-LTranv EPI  LIV  2B Term 04/16/20 [redacted]w[redacted]d  7 lb 15 oz (3.6 kg) M CS-LTranv EPI  LIV  1 Term 12/11/12 109w5d  8 lb 3 oz (3.714 kg) M Vag-Spont EPI  LIV     The following portions of the patient's history were reviewed and updated as appropriate: allergies, current medications, past family history, past medical history, past social history, past surgical history, and problem list.  Review of Systems A comprehensive review of systems was negative.    Objective:    BP 122/70   Wt 260 lb 3.2 oz (118 kg)   LMP 04/25/2022 (Exact Date)   BMI 42.64 kg/m  General appearance: alert, cooperative, and mild distress Abdomen: soft, non-tender; bowel sounds normal; no masses,  no organomegaly Pelvic: cervix normal in appearance, external genitalia normal, no adnexal masses or tenderness, no cervical motion tenderness, uterus normal size, shape, and consistency, and vagina normal without discharge Extremities: extremities normal, atraumatic, no cyanosis or edema Skin: Skin color, texture, turgor normal. No rashes or lesions    Assessment:    Healthy female exam.    Plan:    AS transvaginal Rtn as scheduled  04/27/2022, MD  06/08/2022 6:31 AM

## 2022-05-14 ENCOUNTER — Ambulatory Visit
Admission: RE | Admit: 2022-05-14 | Discharge: 2022-05-14 | Disposition: A | Discharge status: Home / Self Care | Payer: Medicaid Other | Source: Ambulatory Visit | Attending: Obstetrics & Gynecology | Admitting: Obstetrics & Gynecology

## 2022-05-14 DIAGNOSIS — N921 Excessive and frequent menstruation with irregular cycle: Secondary | ICD-10-CM | POA: Diagnosis not present

## 2022-05-18 ENCOUNTER — Ambulatory Visit (INDEPENDENT_AMBULATORY_CARE_PROVIDER_SITE_OTHER): Payer: Medicaid Other | Admitting: Obstetrics & Gynecology

## 2022-05-18 ENCOUNTER — Encounter: Payer: Self-pay | Admitting: Obstetrics & Gynecology

## 2022-05-18 ENCOUNTER — Other Ambulatory Visit (HOSPITAL_COMMUNITY)
Admission: RE | Admit: 2022-05-18 | Discharge: 2022-05-18 | Disposition: A | Discharge status: Home / Self Care | Payer: Medicaid Other | Source: Ambulatory Visit | Attending: Obstetrics & Gynecology | Admitting: Obstetrics & Gynecology

## 2022-05-18 VITALS — BP 118/82 | Wt 261.8 lb

## 2022-05-18 DIAGNOSIS — N921 Excessive and frequent menstruation with irregular cycle: Secondary | ICD-10-CM

## 2022-05-20 LAB — SURGICAL PATHOLOGY

## 2022-06-08 ENCOUNTER — Encounter: Payer: Self-pay | Admitting: Obstetrics & Gynecology

## 2022-06-14 NOTE — Progress Notes (Signed)
Subjective:     Crystal Hansen is a 31 y.o. female here for a routine exam.  Current complaints: menorrhagia w/irregular cycle.  Personal health questionnaire reviewed: yes.   Gynecologic History Patient's last menstrual period was 04/25/2022 (exact date). Contraception:  implant Last Pap: 10/24/2019. Results were: normal Last mammogram: undocumented  Obstetric History OB History  Gravida Para Term Preterm AB Living  2 2 2     3   SAB IAB Ectopic Multiple Live Births        1 3    # Outcome Date GA Lbr Len/2nd Weight Sex Delivery Anes PTL Lv  2A Term 04/16/20 [redacted]w[redacted]d  6 lb 12.1 oz (3.065 kg) M CS-LTranv EPI  LIV  2B Term 04/16/20 [redacted]w[redacted]d  7 lb 15 oz (3.6 kg) M CS-LTranv EPI  LIV  1 Term 12/11/12 [redacted]w[redacted]d  8 lb 3 oz (3.714 kg) M Vag-Spont EPI  LIV     The following portions of the patient's history were reviewed and updated as appropriate: allergies, current medications, past family history, past medical history, past social history, past surgical history, and problem list.  Review of Systems A comprehensive review of systems was negative.    Objective:    BP 118/82   Wt 261 lb 12.8 oz (118.8 kg)   LMP 04/25/2022 (Exact Date)   BMI 42.90 kg/m  General appearance: alert, cooperative, and no distress Abdomen: soft, non-tender; bowel sounds normal; no masses,  no organomegaly Pelvic: cervix normal in appearance, external genitalia normal, no adnexal masses or tenderness, no cervical motion tenderness, uterus normal size, shape, and consistency, and vagina normal without discharge Extremities: extremities normal, atraumatic, no cyanosis or edema Skin: Skin color, texture, turgor normal. No rashes or lesions    Assessment:    Healthy female exam.    Plan:  Surgical pathology ordered  RTO as scheduled     Rosario Adie, MD  06/14/2022 10:37 PM

## 2022-06-17 ENCOUNTER — Other Ambulatory Visit: Payer: Self-pay | Admitting: Family Medicine

## 2022-06-17 NOTE — Telephone Encounter (Signed)
Medication Refill - Medication: ibuprofen (ADVIL) 800 MG tablet  Has the patient contacted their pharmacy? No. (Agent: If no, request that the patient contact the pharmacy for the refill. If patient does not wish to contact the pharmacy document the reason why and proceed with request.) (Agent: If yes, when and what did the pharmacy advise?)  Preferred Pharmacy (with phone number or street name):  [facility], [place] AT [place] Phone:  [phone]  Fax:  [phone]     Has the patient been seen for an appointment in the last year OR does the patient have an upcoming appointment? Yes.    Agent: Please be advised that RX refills may take up to 3 business days. We ask that you follow-up with your pharmacy.

## 2022-06-18 MED ORDER — IBUPROFEN 800 MG PO TABS: 800.0000 mg | ORAL_TABLET | Freq: Three times a day (TID) | ORAL | 0 refills | Status: DC | PRN

## 2022-06-18 NOTE — Telephone Encounter (Signed)
Requested Prescriptions  Pending Prescriptions Disp Refills  . ibuprofen (ADVIL) 800 MG tablet 270 tablet 0    Sig: Take 1 tablet (800 mg total) by mouth every 8 (eight) hours as needed.     Analgesics:  NSAIDS Failed - 06/17/2022  2:23 PM      Failed - Manual Review: Labs are only required if the patient has taken medication for more than 8 weeks.      Passed - Cr in normal range and within 360 days    Creatinine, Ser  Date Value Ref Range Status  01/19/2022 0.72 0.57 - 1.00 mg/dL Final         Passed - HGB in normal range and within 360 days    Hemoglobin  Date Value Ref Range Status  01/19/2022 13.0 11.1 - 15.9 g/dL Final         Passed - PLT in normal range and within 360 days    Platelets  Date Value Ref Range Status  01/19/2022 388 150 - 450 x10E3/uL Final         Passed - HCT in normal range and within 360 days    Hematocrit  Date Value Ref Range Status  01/19/2022 38.0 34.0 - 46.6 % Final         Passed - eGFR is 30 or above and within 360 days    GFR calc Af Amer  Date Value Ref Range Status  05/03/2020 >60 >60 mL/min Final   GFR calc non Af Amer  Date Value Ref Range Status  05/03/2020 >60 >60 mL/min Final   eGFR  Date Value Ref Range Status  01/19/2022 115 >59 mL/min/1.73 Final         Passed - Patient is not pregnant      Passed - Valid encounter within last 12 months    Recent Outpatient Visits          2 months ago Routine screening for STI (sexually transmitted infection)   [facility], Jake Church, DO   4 months ago Community acquired pneumonia, unspecified laterality   [facility], Dionne Bucy, MD   5 months ago RUQ pain   [facility] Tally Joe T, FNP   5 months ago Menorrhagia with irregular cycle   [facility], Dionne Bucy, MD   8 months ago Adjustment disorder with mixed anxiety and depressed mood   [facility] Bacigalupo, Dionne Bucy, MD       Future Appointments            In 9 months Bacigalupo, Dionne Bucy, MD [facility], [place]

## 2022-06-29 ENCOUNTER — Ambulatory Visit (INDEPENDENT_AMBULATORY_CARE_PROVIDER_SITE_OTHER): Payer: Medicaid Other | Admitting: Family Medicine

## 2022-06-29 ENCOUNTER — Encounter: Payer: Self-pay | Admitting: Family Medicine

## 2022-06-29 VITALS — BP 129/68 | HR 87 | Resp 16 | Wt 252.0 lb

## 2022-06-29 DIAGNOSIS — F4323 Adjustment disorder with mixed anxiety and depressed mood: Secondary | ICD-10-CM

## 2022-06-29 MED ORDER — HYDROXYZINE PAMOATE 25 MG PO CAPS: 25.0000 mg | ORAL_CAPSULE | Freq: Three times a day (TID) | ORAL | 0 refills | Status: DC | PRN

## 2022-06-29 MED ORDER — ESCITALOPRAM OXALATE 10 MG PO TABS: 10.0000 mg | ORAL_TABLET | Freq: Every day | ORAL | 0 refills | Status: DC

## 2022-06-29 NOTE — Assessment & Plan Note (Signed)
Chronic, uncontrolled and recently exacerbated by relational stress. Referred for counseling, will start lexapro with atarax for panic attacks, insomnia. Discussed healthy coping mechanism. No active SI, contracted for safety. Does have access to firearms, discussed safety precautions. Protective factors include twin sons and supportive mom. F/u in 4 weeks.

## 2022-06-29 NOTE — Progress Notes (Signed)
   SUBJECTIVE:   CHIEF COMPLAINT / HPI:   Adjustment d/o with anxiety/depressed mood - Medications: none. Previously on zoloft, did not do well, had increased GI discomfort and suicidal thoughts. - Taking: n/a - Counseling: not currently - Previous hospitalizations: none - Symptoms: insomnia, loss of appetite, panic attacks. - Current stressors: recent breakup with boyfriend. Grandmother died in 11-26-22.  - Coping Mechanisms: smokes MJ, deep breathing, crying     06/29/2022    9:00 AM 04/01/2022    9:09 AM 02/03/2022   10:26 AM  Depression screen PHQ 2/9  Decreased Interest 2 0 0  Down, Depressed, Hopeless 2 0 1  PHQ - 2 Score 4 0 1  Altered sleeping 2 0 0  Tired, decreased energy 2 1 1   Change in appetite 2 1 2   Feeling bad or failure about yourself  2 0 2  Trouble concentrating 2 0 0  Moving slowly or fidgety/restless 1 0 0  Suicidal thoughts 1 0 0  PHQ-9 Score 16 2 6   Difficult doing work/chores Very difficult Not difficult at all Not difficult at all      06/29/2022    8:59 AM 09/30/2021    9:25 AM 06/30/2021   12:58 PM  GAD 7 : Generalized Anxiety Score  Nervous, Anxious, on Edge 2 1 1   Control/stop worrying 2 1 0  Worry too much - different things 2 1 0  Trouble relaxing 2 2 0  Restless 2 1 0  Easily annoyed or irritable 2 3 1   Afraid - awful might happen 2 1 0  Total GAD 7 Score 14 10 2   Anxiety Difficulty Very difficult Somewhat difficult Not difficult at all     OBJECTIVE:   BP 129/68 (BP Location: Left Arm, Patient Position: Sitting, Cuff Size: Large)   Pulse 87   Resp 16   Wt 252 lb (114.3 kg)   SpO2 100%   BMI 41.30 kg/m   Gen: nontoxic appearing, in mild distress Psych: tearful. Speech nonpressured, thought process non tangential. No active SI. Appropriate insight and judgement.   ASSESSMENT/PLAN:   Adjustment disorder with mixed anxiety and depressed mood Chronic, uncontrolled and recently exacerbated by relational stress. Referred for  counseling, will start lexapro with atarax for panic attacks, insomnia. Discussed healthy coping mechanism. No active SI, contracted for safety. Does have access to firearms, discussed safety precautions. Protective factors include twin sons and supportive mom. F/u in 4 weeks.      Myles Gip, DO

## 2022-07-08 ENCOUNTER — Other Ambulatory Visit: Payer: Self-pay | Admitting: Licensed Clinical Social Worker

## 2022-07-08 NOTE — Patient Instructions (Signed)
Visit Information  Crystal Hansen was given information about Medicaid Managed Care team care coordination services as a part of their [place] Medicaid benefit. [place] verbally consented to engagement with the [facility] Managed Care team.   If you are experiencing a medical emergency, please call 911 or report to your local emergency department or urgent care.   If you have a non-emergency medical problem during routine business hours, please contact your provider's office and ask to speak with a nurse.   For questions related to your [facility], please call: [phone] or visit the homepage here: [email or web address]  If you would like to schedule transportation through your [facility], please call the following number at least 2 days in advance of your appointment: [phone]   Rides for urgent appointments can also be made after hours by calling Member Services.  Call the [place] at [phone], at any time, 24 hours a day, 7 days a week. If you are in danger or need immediate medical attention call 911.  If you would like help to quit smoking, call 1-800-QUIT-NOW 253-593-2108) OR Espaol: 1-855-Djelo-Ya (7-681-157-2620) o para ms informacin haga clic aqu or Text READY to 200-400 to register via text   Following is a copy of your plan of care:  Care Plan : LCSW Plan of Care  Updates made by Greg Cutter, LCSW since 07/08/2022 12:00 AM     Problem: Coping Skills (General Plan of Care)      Long-Range Goal: Coping Skills Enhanced   Start Date: 07/08/2022  This Visit's Progress: On track  Priority: Medium  Note:   Priority: High  Timeframe:  Long-Range Goal Priority:  High Start Date:   07/08/22            Expected End Date:  ongoing                     Follow Up Date-- 07/14/22 at 49 am  - keep 90  percent of scheduled appointments -consider counseling or psychiatry -consider bumping up your self-care  -consider creating a stronger support network   Why is this important?             Beating depression may take some time.            If you don't feel better right away, don't give up on your treatment plan.    Current barriers:   Chronic Mental Health needs related to depression, stress and adjustment disorder. Patient requires Support, Education, Resources, Referrals, Advocacy, and Care Coordination, in order to meet Unmet Mental Health Needs. Patient will implement clinical interventions discussed today to decrease symptoms of depression and increase knowledge and/or ability of: coping skills. Mental Health Concerns and Social Isolation Patient lacks knowledge of available community counseling agencies and resources.  Clinical Goal(s): verbalize understanding of plan for management of Adjustment Disorder, Depression, and Stress and demonstrate a reduction in symptoms. Patient will connect with a provider for ongoing mental health treatment, increase coping skills, healthy habits, self-management skills, and stress reduction        Patient Goals/Self-Care Activities: Over the next 120 days Attend scheduled medical appointments Utilize healthy coping skills and supportive resources discussed Contact PCP with any questions or concerns Keep 90 percent of counseling appointments Call your insurance provider for more information about your Enhanced Benefits  Check out counseling resources provided  Begin personal counseling with LCSW, to reduce  and manage symptoms of Depression and Stress, until well-established with mental health provider Accept all calls from representative with [facility] in an effort to establish ongoing mental health counseling and supportive services. Incorporate into daily practice - relaxation techniques, deep breathing exercises, and mindfulness meditation  strategies. Talk about feelings with friends, family members, spiritual advisor, etc. Contact LCSW directly [phone]), if you have questions, need assistance, or if additional social work needs are identified between now and our next scheduled telephone outreach call. Call 988 for mental health hotline/crisis line if needed (24/7 available) Try techniques to reduce symptoms of anxiety/negative thinking (deep breathing, distraction, positive self talk, etc)  - develop a personal safety plan - develop a plan to deal with triggers like holidays, anniversaries - exercise at least 2 to 3 times per week - have a plan for how to handle bad days - journal feelings and what helps to feel better or worse - spend time or talk with others at least 2 to 3 times per week - watch for early signs of feeling worse - begin personal counseling - call and visit an old friend - check out volunteer opportunities - join a support group - laugh; watch a funny movie or comedian - learn and use visualization or guided imagery - perform a random act of kindness - practice relaxation or meditation daily - start or continue a personal journal - practice positive thinking and self-talk -continue with compliance of taking medication  -identify current effective and ineffective coping strategies.  -implement positive self-talk in care to increase self-esteem, confidence and feelings of control.  -consider alternative and complementary therapy approaches such as meditation, mindfulness or yoga.  -journaling, prayer, worship services, meditation or pastoral counseling.  -increase participation in pleasurable group activities such as hobbies, singing, sports or volunteering).  -consider the use of meditative movement therapy such as tai chi, yoga or qigong.  -start a regular daily exercise program based on tolerance, ability and patient choice to support positive thinking and activity    If you are experiencing a  Mental Health or [place] or need someone to talk to, please call the Suicide and Crisis Lifeline: 988    Patient Goals: Initial goal

## 2022-07-08 NOTE — Patient Outreach (Signed)
Medicaid Managed Care Social Work Note  07/08/2022 Name:  MARYLOU WAGES MRN:  [id number] DOB:  1991/08/20  Rosann Auerbach Windholz is an 31 y.o. year old female who is a primary patient of Bacigalupo, Dionne Bucy, MD.  The Medicaid Managed Care Coordination team was consulted for assistance with:  [place] and Resources  Ms. Danielsen was given information about Medicaid Managed Care Coordination team services today. [place] Patient agreed to services and verbal consent obtained.  Engaged with patient  for by telephone forinitial visit in response to referral for case management and/or care coordination services.   Assessments/Interventions:  Review of past medical history, allergies, medications, health status, including review of consultants reports, laboratory and other test data, was performed as part of comprehensive evaluation and provision of chronic care management services.  SDOH: (Social Determinant of Health) assessments and interventions performed: SDOH Interventions    Flowsheet Row Patient Outreach Telephone from 07/08/2022 in [place] Office Visit from 06/29/2022 in [place] Visit from 02/20/2019 in [place]  SDOH Interventions     Depression Interventions/Treatment  Medication, Counseling Medication, Counseling Patient refuses Treatment  Stress Interventions Offered [employer], Provide Counseling -- --       Advanced Directives Status:  See Care Plan for related entries.  Care Plan                 Allergies  Allergen Reactions   Peanut Oil Anaphylaxis   Shellfish Allergy Anaphylaxis   Amoxapine And Related    Apple Juice Other (See Comments)   Amoxicillin Rash    Medications Reviewed Today     Reviewed by Greg Cutter, LCSW (Social Worker) on 07/08/22 at 11  Med List Status: <None>   Medication Order Taking? Sig Documenting  Provider Last Dose Status Informant  cetirizine (ZYRTEC) 10 MG tablet [id number] No Take 1 tablet (10 mg total) by mouth as needed for allergies. [name], FNP Taking Active   escitalopram (LEXAPRO) 10 MG tablet [id number]  Take 1 tablet (10 mg total) by mouth daily. Myles Gip, DO  Active   etonogestrel (NEXPLANON) 68 MG IMPL implant [id number] No 1 each by Subdermal route once. [provider] Taking Active   hydrOXYzine (VISTARIL) 25 MG capsule [id number]  Take 1 capsule (25 mg total) by mouth every 8 (eight) hours as needed. Myles Gip, DO  Active   ibuprofen (ADVIL) 800 MG tablet [id number] No Take 1 tablet (800 mg total) by mouth every 8 (eight) hours as needed. [name], MD Taking Active             Patient Active Problem List   Diagnosis Date Noted   Labial lesion 04/01/2022   Acute non-recurrent frontal sinusitis 01/19/2022   RUQ pain 01/19/2022   Dysmenorrhea 01/08/2022   Menorrhagia with irregular cycle 01/08/2022   Concentration deficit 09/30/2021   Morbid obesity ([place]) 03/24/2021   SUI (stress urinary incontinence, female) 03/24/2021   Urinary frequency 03/24/2021   Adjustment disorder with mixed anxiety and depressed mood 03/24/2021   COVID-19 vaccination declined 09/13/2020   Chronic left-sided low back pain without sciatica 09/13/2020   Meralgia paresthetica of left side 09/13/2020   Allergic rhinitis 02/23/2019   Tobacco use disorder 02/15/2019   Marijuana use 12/10/2012    Conditions to be addressed/monitored per PCP order:  Anxiety and Depression  Care Plan : LCSW Plan of Care  Updates made by Blanch [place],  Glennie Rodda L, LCSW since 07/08/2022 12:00 AM     Problem: Coping Skills (General Plan of Care)      Long-Range Goal: Coping Skills Enhanced   Start Date: 07/08/2022  This Visit's Progress: On track  Priority: Medium  Note:   Priority: High  Timeframe:  Long-Range Goal Priority:  High Start Date:   07/08/22             Expected End Date:  ongoing                     Follow Up Date-- 07/14/22 at 52 am  - keep 90 percent of scheduled appointments -consider counseling or psychiatry -consider bumping up your self-care  -consider creating a stronger support network   Why is this important?             Beating depression may take some time.            If you don't feel better right away, don't give up on your treatment plan.    Current barriers:   Chronic Mental Health needs related to depression, stress and adjustment disorder. Patient requires Support, Education, Resources, Referrals, Advocacy, and Care Coordination, in order to meet Unmet Mental Health Needs. Patient will implement clinical interventions discussed today to decrease symptoms of depression and increase knowledge and/or ability of: coping skills. Mental Health Concerns and Social Isolation Patient lacks knowledge of available community counseling agencies and resources.  Clinical Goal(s): verbalize understanding of plan for management of Adjustment Disorder, Depression, and Stress and demonstrate a reduction in symptoms. Patient will connect with a provider for ongoing mental health treatment, increase coping skills, healthy habits, self-management skills, and stress reduction        Clinical Interventions:  Assessed patient's previous and current treatment, coping skills, support system and barriers to care. Patient cannot take Zoloft as it previously caused adverse side effects that led to suicidal ideations and almost attempts. Crisis resource information provided to patient.  Verbalization of feelings encouraged, motivational interviewing employed Emotional support provided, positive coping strategies explored. Establishing healthy boundaries emphasized and healthy self-care education provided. Patient has had conflict with spouse which brought about this new added stress.  Patient was educated on available mental health resources within  their area that accept Medicaid and offer counseling and psychiatry. Patient only wants counseling. Patient reports that she recently started on a medicine for depression and one for her anxiety. She reports already seeing improvement in her symptoms.  Patient reports significant worsening anxiety and depression impacting her ability to function appropriately and carry out daily task. Patient is agreeable to referral to [facility] for counseling. [facility] LCSW made referral on 07/08/22. LCSW provided education on relaxation techniques such as meditation, deep breathing, massage, grounding exercises or yoga that can activate the body's relaxation response and ease symptoms of stress and anxiety. LCSW ask that when pt is struggling with difficult emotions and racing thoughts that they start this relaxation response process. LCSW provided extensive education on healthy coping skills for anxiety. SW used active and reflective listening, validated patient's feelings/concerns, and provided emotional support. Patient will work on implementing appropriate self-care habits into their daily routine such as: staying positive, writing a gratitude list, drinking water, staying active around the house, taking their medications as prescribed, combating negative thoughts or emotions and staying connected with their family and friends. Positive reinforcement provided for this decision to work on this.  Motivational Interviewing employed Depression screen reviewed  PHQ2/ PHQ9  completed or reviewed  Mindfulness or Relaxation training provided Active listening / Reflection utilized  Advance Care and HCPOA education provided Emotional Support Provided Problem [place] strategies reviewed Provided psychoeducation for mental health needs  Provided brief CBT  Reviewed mental health medications and discussed importance of compliance:  Quality of sleep assessed & Sleep Hygiene techniques promoted  Participation  in counseling encouraged  Verbalization of feelings encouraged  Suicidal Ideation/Homicidal Ideation assessed: Patient denies SI/HI  Review resources, discussed options and provided patient information about  [place] care team collaboration (see longitudinal plan of care) Patient Goals/Self-Care Activities: Over the next 120 days Attend scheduled medical appointments Utilize healthy coping skills and supportive resources discussed Contact PCP with any questions or concerns Keep 90 percent of counseling appointments Call your insurance provider for more information about your Enhanced Benefits  Check out counseling resources provided  Begin personal counseling with LCSW, to reduce and manage symptoms of Depression and Stress, until well-established with mental health provider Accept all calls from representative with [facility] in an effort to establish ongoing mental health counseling and supportive services. Incorporate into daily practice - relaxation techniques, deep breathing exercises, and mindfulness meditation strategies. Talk about feelings with friends, family members, spiritual advisor, etc. Contact LCSW directly [phone]), if you have questions, need assistance, or if additional social work needs are identified between now and our next scheduled telephone outreach call. Call 988 for mental health hotline/crisis line if needed (24/7 available) Try techniques to reduce symptoms of anxiety/negative thinking (deep breathing, distraction, positive self talk, etc)  - develop a personal safety plan - develop a plan to deal with triggers like holidays, anniversaries - exercise at least 2 to 3 times per week - have a plan for how to handle bad days - journal feelings and what helps to feel better or worse - spend time or talk with others at least 2 to 3 times per week - watch for early signs of feeling worse - begin personal counseling -  call and visit an old friend - check out volunteer opportunities - join a support group - laugh; watch a funny movie or comedian - learn and use visualization or guided imagery - perform a random act of kindness - practice relaxation or meditation daily - start or continue a personal journal - practice positive thinking and self-talk -continue with compliance of taking medication  -identify current effective and ineffective coping strategies.  -implement positive self-talk in care to increase self-esteem, confidence and feelings of control.  -consider alternative and complementary therapy approaches such as meditation, mindfulness or yoga.  -journaling, prayer, worship services, meditation or pastoral counseling.  -increase participation in pleasurable group activities such as hobbies, singing, sports or volunteering).  -consider the use of meditative movement therapy such as tai chi, yoga or qigong.  -start a regular daily exercise program based on tolerance, ability and patient choice to support positive thinking and activity    If you are experiencing a Mental Health or [place] or need someone to talk to, please call the Suicide and Crisis Lifeline: 988    Patient Goals: Initial goal      Follow up:  Patient agrees to Care Plan and Follow-up.  Plan: The Managed Medicaid care management team will reach out to the patient again over the next 30 days.  Date/time of next scheduled Social Work care management/care coordination outreach:  07/14/22 at [place], [place], MSW, [employer] Managed Medicaid LCSW [place]  [email or web address] Phone: [phone]

## 2022-07-13 ENCOUNTER — Ambulatory Visit: Payer: Medicaid Other | Admitting: Licensed Clinical Social Worker

## 2022-07-14 ENCOUNTER — Other Ambulatory Visit: Payer: Medicaid Other | Admitting: Licensed Clinical Social Worker

## 2022-07-14 NOTE — Patient Instructions (Signed)
Visit Information  Ms. Mcmeekin was given information about Medicaid Managed Care team care coordination services as a part of their [place] Medicaid benefit. [place] verbally consented to engagement with the [facility] Managed Care team.   If you are experiencing a medical emergency, please call 911 or report to your local emergency department or urgent care.   If you have a non-emergency medical problem during routine business hours, please contact your provider's office and ask to speak with a nurse.   For questions related to your [facility], please call: [phone] or visit the homepage here: [email or web address]  If you would like to schedule transportation through your [facility], please call the following number at least 2 days in advance of your appointment: [phone]   Rides for urgent appointments can also be made after hours by calling Member Services.  Call the [place] at [phone], at any time, 24 hours a day, 7 days a week. If you are in danger or need immediate medical attention call 911.  If you would like help to quit smoking, call 1-800-QUIT-NOW 209-261-1327) OR Espaol: 1-855-Djelo-Ya (3-419-622-2979) o para ms informacin haga clic aqu or Text READY to 200-400 to register via text  Following is a copy of your plan of care:  Care Plan : LCSW Plan of Care  Updates made by Greg Cutter, LCSW since 07/14/2022 12:00 AM     Problem: Coping Skills (General Plan of Care)      Long-Range Goal: Coping Skills Enhanced   Start Date: 07/08/2022  Recent Progress: On track  Priority: Medium  Note:   Priority: High  Timeframe:  Long-Range Goal Priority:  High Start Date:   07/08/22            Expected End Date:  ongoing                     Follow Up Date-- 07/14/22 at 31 am  - keep 90 percent  of scheduled appointments -consider counseling or psychiatry -consider bumping up your self-care  -consider creating a stronger support network   Why is this important?             Beating depression may take some time.            If you don't feel better right away, don't give up on your treatment plan.    Current barriers:   Chronic Mental Health needs related to depression, stress and adjustment disorder. Patient requires Support, Education, Resources, Referrals, Advocacy, and Care Coordination, in order to meet Unmet Mental Health Needs. Patient will implement clinical interventions discussed today to decrease symptoms of depression and increase knowledge and/or ability of: coping skills. Mental Health Concerns and Social Isolation Patient lacks knowledge of available community counseling agencies and resources.  Clinical Goal(s): verbalize understanding of plan for management of Adjustment Disorder, Depression, and Stress and demonstrate a reduction in symptoms. Patient will connect with a provider for ongoing mental health treatment, increase coping skills, healthy habits, self-management skills, and stress reduction        Patient Goals/Self-Care Activities: Over the next 120 days Attend scheduled medical appointments Utilize healthy coping skills and supportive resources discussed Contact PCP with any questions or concerns Keep 90 percent of counseling appointments Call your insurance provider for more information about your Enhanced Benefits  Check out counseling resources provided  Begin personal counseling with LCSW, to reduce and manage  symptoms of Depression and Stress, until well-established with mental health provider Accept all calls from representative with [facility] in an effort to establish ongoing mental health counseling and supportive services. Incorporate into daily practice - relaxation techniques, deep breathing exercises, and mindfulness meditation  strategies. Talk about feelings with friends, family members, spiritual advisor, etc. Contact LCSW directly [phone]), if you have questions, need assistance, or if additional social work needs are identified between now and our next scheduled telephone outreach call. Call 988 for mental health hotline/crisis line if needed (24/7 available) Try techniques to reduce symptoms of anxiety/negative thinking (deep breathing, distraction, positive self talk, etc)  - develop a personal safety plan - develop a plan to deal with triggers like holidays, anniversaries - exercise at least 2 to 3 times per week - have a plan for how to handle bad days - journal feelings and what helps to feel better or worse - spend time or talk with others at least 2 to 3 times per week - watch for early signs of feeling worse - begin personal counseling - call and visit an old friend - check out volunteer opportunities - join a support group - laugh; watch a funny movie or comedian - learn and use visualization or guided imagery - perform a random act of kindness - practice relaxation or meditation daily - start or continue a personal journal - practice positive thinking and self-talk -continue with compliance of taking medication  -identify current effective and ineffective coping strategies.  -implement positive self-talk in care to increase self-esteem, confidence and feelings of control.  -consider alternative and complementary therapy approaches such as meditation, mindfulness or yoga.  -journaling, prayer, worship services, meditation or pastoral counseling.  -increase participation in pleasurable group activities such as hobbies, singing, sports or volunteering).  -consider the use of meditative movement therapy such as tai chi, yoga or qigong.  -start a regular daily exercise program based on tolerance, ability and patient choice to support positive thinking and activity    If you are experiencing a  Mental Health or [place] or need someone to talk to, please call the Suicide and Crisis Lifeline: 988    Patient Goals: Initial goal

## 2022-07-14 NOTE — Patient Outreach (Signed)
Medicaid Managed Care Social Work Note  07/14/2022 Name:  Crystal Hansen MRN:  [id number] DOB:  03/31/1991  Crystal Hansen is an 31 y.o. year old female who is a primary patient of Bacigalupo, Dionne Bucy, MD.  The Medicaid Managed Care Coordination team was consulted for assistance with:  [place] and Resources  Ms. Liddell was given information about Medicaid Managed Care Coordination team services today. [place] Patient agreed to services and verbal consent obtained.  Engaged with patient  for by telephone forfollow up visit in response to referral for case management and/or care coordination services.   Assessments/Interventions:  Review of past medical history, allergies, medications, health status, including review of consultants reports, laboratory and other test data, was performed as part of comprehensive evaluation and provision of chronic care management services.  SDOH: (Social Determinant of Health) assessments and interventions performed: SDOH Interventions    Flowsheet Row Patient Outreach Telephone from 07/08/2022 in [place] Office Visit from 06/29/2022 in [place] Visit from 02/20/2019 in [place]  SDOH Interventions     Depression Interventions/Treatment  Medication, Counseling Medication, Counseling Patient refuses Treatment  Stress Interventions Offered [employer], Provide Counseling -- --       Advanced Directives Status:  See Care Plan for related entries.  Care Plan                 Allergies  Allergen Reactions   Peanut Oil Anaphylaxis   Shellfish Allergy Anaphylaxis   Amoxapine And Related    Apple Juice Other (See Comments)   Amoxicillin Rash    Medications Reviewed Today     Reviewed by Greg Cutter, LCSW (Social Worker) on 07/08/22 at 58  Med List Status: <None>   Medication Order Taking? Sig  Documenting Provider Last Dose Status Informant  cetirizine (ZYRTEC) 10 MG tablet [id number] No Take 1 tablet (10 mg total) by mouth as needed for allergies. [name], FNP Taking Active   escitalopram (LEXAPRO) 10 MG tablet [id number]  Take 1 tablet (10 mg total) by mouth daily. Myles Gip, DO  Active   etonogestrel (NEXPLANON) 68 MG IMPL implant [id number] No 1 each by Subdermal route once. [provider] Taking Active   hydrOXYzine (VISTARIL) 25 MG capsule [id number]  Take 1 capsule (25 mg total) by mouth every 8 (eight) hours as needed. Myles Gip, DO  Active   ibuprofen (ADVIL) 800 MG tablet [id number] No Take 1 tablet (800 mg total) by mouth every 8 (eight) hours as needed. [name], MD Taking Active             Patient Active Problem List   Diagnosis Date Noted   Labial lesion 04/01/2022   Acute non-recurrent frontal sinusitis 01/19/2022   RUQ pain 01/19/2022   Dysmenorrhea 01/08/2022   Menorrhagia with irregular cycle 01/08/2022   Concentration deficit 09/30/2021   Morbid obesity ([place]) 03/24/2021   SUI (stress urinary incontinence, female) 03/24/2021   Urinary frequency 03/24/2021   Adjustment disorder with mixed anxiety and depressed mood 03/24/2021   COVID-19 vaccination declined 09/13/2020   Chronic left-sided low back pain without sciatica 09/13/2020   Meralgia paresthetica of left side 09/13/2020   Allergic rhinitis 02/23/2019   Tobacco use disorder 02/15/2019   Marijuana use 12/10/2012    Conditions to be addressed/monitored per PCP order:  Anxiety and Depression  Care Plan : LCSW Plan of Care  Updates made by  Greg Cutter, LCSW since 07/14/2022 12:00 AM     Problem: Coping Skills (General Plan of Care)      Long-Range Goal: Coping Skills Enhanced   Start Date: 07/08/2022  Recent Progress: On track  Priority: Medium  Note:   Priority: High  Timeframe:  Long-Range Goal Priority:  High Start Date:   07/08/22             Expected End Date:  ongoing                     Follow Up Date-- 07/14/22 at 54 am  - keep 90 percent of scheduled appointments -consider counseling or psychiatry -consider bumping up your self-care  -consider creating a stronger support network   Why is this important?             Beating depression may take some time.            If you don't feel better right away, don't give up on your treatment plan.    Current barriers:   Chronic Mental Health needs related to depression, stress and adjustment disorder. Patient requires Support, Education, Resources, Referrals, Advocacy, and Care Coordination, in order to meet Unmet Mental Health Needs. Patient will implement clinical interventions discussed today to decrease symptoms of depression and increase knowledge and/or ability of: coping skills. Mental Health Concerns and Social Isolation Patient lacks knowledge of available community counseling agencies and resources.  Clinical Goal(s): verbalize understanding of plan for management of Adjustment Disorder, Depression, and Stress and demonstrate a reduction in symptoms. Patient will connect with a provider for ongoing mental health treatment, increase coping skills, healthy habits, self-management skills, and stress reduction        Clinical Interventions:  Assessed patient's previous and current treatment, coping skills, support system and barriers to care. Patient cannot take Zoloft as it previously caused adverse side effects that led to suicidal ideations and almost attempts. Crisis resource information provided to patient.  Verbalization of feelings encouraged, motivational interviewing employed Emotional support provided, positive coping strategies explored. Establishing healthy boundaries emphasized and healthy self-care education provided. Patient has had conflict with spouse which brought about this new added stress.  Patient was educated on available mental health resources within  their area that accept Medicaid and offer counseling and psychiatry. Patient only wants counseling. Patient reports that she recently started on a medicine for depression and one for her anxiety. She reports already seeing improvement in her symptoms.  Patient reports significant worsening anxiety and depression impacting her ability to function appropriately and carry out daily task. Patient is agreeable to referral to [facility] for counseling. [facility] LCSW made referral on 07/08/22. LCSW provided education on relaxation techniques such as meditation, deep breathing, massage, grounding exercises or yoga that can activate the body's relaxation response and ease symptoms of stress and anxiety. LCSW ask that when pt is struggling with difficult emotions and racing thoughts that they start this relaxation response process. LCSW provided extensive education on healthy coping skills for anxiety. SW used active and reflective listening, validated patient's feelings/concerns, and provided emotional support. Patient will work on implementing appropriate self-care habits into their daily routine such as: staying positive, writing a gratitude list, drinking water, staying active around the house, taking their medications as prescribed, combating negative thoughts or emotions and staying connected with their family and friends. Positive reinforcement provided for this decision to work on this. UPDATE- Patient reports that she has not had the chance  to follow up on counseling referral yet but will do so over the next 2 weeks in order to complete intake for [facility]. Reminder email sent to patient as well today.  Motivational Interviewing employed Depression screen reviewed  PHQ2/ PHQ9 completed or reviewed  Mindfulness or Relaxation training provided Active listening / Reflection utilized  Advance Care and HCPOA education provided Emotional Support Provided Problem [place] strategies  reviewed Provided psychoeducation for mental health needs  Provided brief CBT  Reviewed mental health medications and discussed importance of compliance:  Quality of sleep assessed & Sleep Hygiene techniques promoted  Participation in counseling encouraged  Verbalization of feelings encouraged  Suicidal Ideation/Homicidal Ideation assessed: Patient denies SI/HI  Review resources, discussed options and provided patient information about  [place] care team collaboration (see longitudinal plan of care) Patient Goals/Self-Care Activities: Over the next 120 days Attend scheduled medical appointments Utilize healthy coping skills and supportive resources discussed Contact PCP with any questions or concerns Keep 90 percent of counseling appointments Call your insurance provider for more information about your Enhanced Benefits  Check out counseling resources provided  Begin personal counseling with LCSW, to reduce and manage symptoms of Depression and Stress, until well-established with mental health provider Accept all calls from representative with [facility] in an effort to establish ongoing mental health counseling and supportive services. Incorporate into daily practice - relaxation techniques, deep breathing exercises, and mindfulness meditation strategies. Talk about feelings with friends, family members, spiritual advisor, etc. Contact LCSW directly [phone]), if you have questions, need assistance, or if additional social work needs are identified between now and our next scheduled telephone outreach call. Call 988 for mental health hotline/crisis line if needed (24/7 available) Try techniques to reduce symptoms of anxiety/negative thinking (deep breathing, distraction, positive self talk, etc)  - develop a personal safety plan - develop a plan to deal with triggers like holidays, anniversaries - exercise at least 2 to 3 times per week -  have a plan for how to handle bad days - journal feelings and what helps to feel better or worse - spend time or talk with others at least 2 to 3 times per week - watch for early signs of feeling worse - begin personal counseling - call and visit an old friend - check out volunteer opportunities - join a support group - laugh; watch a funny movie or comedian - learn and use visualization or guided imagery - perform a random act of kindness - practice relaxation or meditation daily - start or continue a personal journal - practice positive thinking and self-talk -continue with compliance of taking medication  -identify current effective and ineffective coping strategies.  -implement positive self-talk in care to increase self-esteem, confidence and feelings of control.  -consider alternative and complementary therapy approaches such as meditation, mindfulness or yoga.  -journaling, prayer, worship services, meditation or pastoral counseling.  -increase participation in pleasurable group activities such as hobbies, singing, sports or volunteering).  -consider the use of meditative movement therapy such as tai chi, yoga or qigong.  -start a regular daily exercise program based on tolerance, ability and patient choice to support positive thinking and activity    If you are experiencing a Mental Health or [place] or need someone to talk to, please call the Suicide and Crisis Lifeline: 988    Patient Goals: Initial goal      Follow up:  Patient agrees to Care Plan and Follow-up.  Plan: The  Managed Medicaid care management team will reach out to the patient again over the next 30 days.  Date/time of next scheduled Social Work care management/care coordination outreach:  07/28/22 at 45 am  Eula Fried, BSW, MSW, [place] Medicaid [email or web address] Phone: [phone]

## 2022-07-15 ENCOUNTER — Ambulatory Visit (INDEPENDENT_AMBULATORY_CARE_PROVIDER_SITE_OTHER): Payer: Medicaid Other | Admitting: Obstetrics and Gynecology

## 2022-07-15 ENCOUNTER — Encounter: Payer: Self-pay | Admitting: Obstetrics and Gynecology

## 2022-07-15 VITALS — BP 122/73 | HR 86 | Ht 65.5 in | Wt 244.0 lb

## 2022-07-15 DIAGNOSIS — Z3009 Encounter for other general counseling and advice on contraception: Secondary | ICD-10-CM

## 2022-07-15 DIAGNOSIS — Z7689 Persons encountering health services in other specified circumstances: Secondary | ICD-10-CM

## 2022-07-15 NOTE — Progress Notes (Signed)
Patient presents today to discuss possible BTL. She states that she has three children and that she is done with childbearing. Patient currently has a Nexplanon, feels that it makes her "crazy". No additional concerns.

## 2022-07-15 NOTE — Progress Notes (Signed)
HPI:      [name] is a 31 y.o. [phone] who LMP was Patient's last menstrual period was 07/09/2022 (approximate).  Subjective:   She presents today because she would like to discuss permanent sterilization.  She has Nexplanon and she likes her menses because she is currently amenorrheic but she hates the fact that it is making her "crazy".  She reports that her regular menstrual period is very heavy with clots and cramps and obvious significant dysmenorrhea.  Her main concern is that she thinks the mood changes associated with Nexplanon are untenable. She has affirmed her desire for permanent sterilization as she has no doubt she is finished having children.    Hx: The following portions of the patient's history were reviewed and updated as appropriate:             She  has a past medical history of Allergy, Anxiety, Arthritis, Cesarean wound infection (05/03/2020), and Headache. She does not have any pertinent problems on file. She  has a past surgical history that includes Appendectomy; Cesarean section multi-gestational (04/16/2020); and Wisdom tooth extraction. Her family history includes Anemia in her paternal grandmother; Cancer in her maternal grandmother; Cataracts in her maternal grandfather and mother; Colon cancer in her paternal grandfather; Dementia in her paternal grandmother; Diabetes in her maternal grandfather and mother; Eczema in her father; Heart Problems in her maternal aunt; Heart disease in her maternal grandfather and mother; Kidney disease in her mother; Neuropathy in her mother. She  reports that she quit smoking about 2 years ago. Her smoking use included cigarettes. She has a 6.50 pack-year smoking history. She has never used smokeless tobacco. She reports current alcohol use of about 3.0 - 4.0 standard drinks of alcohol per week. She reports current drug use. Drug: Marijuana. She has a current medication list which includes the following prescription(s):  cetirizine, escitalopram, nexplanon, hydroxyzine, and ibuprofen. She is allergic to peanut oil, shellfish allergy, amoxapine and related, apple juice, and amoxicillin.       Review of Systems:  Review of Systems  Constitutional: Denied constitutional symptoms, night sweats, recent illness, fatigue, fever, insomnia and weight loss.  Eyes: Denied eye symptoms, eye pain, photophobia, vision change and visual disturbance.  Ears/Nose/Throat/Neck: Denied ear, nose, throat or neck symptoms, hearing loss, nasal discharge, sinus congestion and sore throat.  Cardiovascular: Denied cardiovascular symptoms, arrhythmia, chest pain/pressure, edema, exercise intolerance, orthopnea and palpitations.  Respiratory: Denied pulmonary symptoms, asthma, pleuritic pain, productive sputum, cough, dyspnea and wheezing.  Gastrointestinal: Denied, gastro-esophageal reflux, melena, nausea and vomiting.  Genitourinary: Denied genitourinary symptoms including symptomatic vaginal discharge, pelvic relaxation issues, and urinary complaints.  Musculoskeletal: Denied musculoskeletal symptoms, stiffness, swelling, muscle weakness and myalgia.  Dermatologic: Denied dermatology symptoms, rash and scar.  Neurologic: Denied neurology symptoms, dizziness, headache, neck pain and syncope.  Psychiatric: Denied psychiatric symptoms, anxiety and depression.  Endocrine: Denied endocrine symptoms including hot flashes and night sweats.   Meds:   Current Outpatient Medications on File Prior to Visit  Medication Sig Dispense Refill   cetirizine (ZYRTEC) 10 MG tablet Take 1 tablet (10 mg total) by mouth as needed for allergies. 90 tablet 3   escitalopram (LEXAPRO) 10 MG tablet Take 1 tablet (10 mg total) by mouth daily. 90 tablet 0   etonogestrel (NEXPLANON) 68 MG IMPL implant 1 each by Subdermal route once.     hydrOXYzine (VISTARIL) 25 MG capsule Take 1 capsule (25 mg total) by mouth every 8 (eight) hours as needed. 30 capsule  0    ibuprofen (ADVIL) 800 MG tablet Take 1 tablet (800 mg total) by mouth every 8 (eight) hours as needed. 270 tablet 0   No current facility-administered medications on file prior to visit.      Objective:     Vitals:   07/15/22 1335  BP: 122/73  Pulse: 86   Filed Weights   07/15/22 1335  Weight: 244 lb (110.7 kg)                        Assessment:    G2P2003 Patient Active Problem List   Diagnosis Date Noted   Labial lesion 04/01/2022   Acute non-recurrent frontal sinusitis 01/19/2022   RUQ pain 01/19/2022   Dysmenorrhea 01/08/2022   Menorrhagia with irregular cycle 01/08/2022   Concentration deficit 09/30/2021   Morbid obesity ([facility]) 03/24/2021   SUI (stress urinary incontinence, female) 03/24/2021   Urinary frequency 03/24/2021   Adjustment disorder with mixed anxiety and depressed mood 03/24/2021   COVID-19 vaccination declined 09/13/2020   Chronic left-sided low back pain without sciatica 09/13/2020   Meralgia paresthetica of left side 09/13/2020   Allergic rhinitis 02/23/2019   Tobacco use disorder 02/15/2019   Marijuana use 12/10/2012     1. Establishing care with new doctor, encounter for   2. Consultation for female sterilization        Plan:            1.  We have discussed permanent sterilization as well as LARC methods of birth control.  I have tentatively recommended the IUD for the benefit of cycle control as well as birth control.  After discussion she has decided that she would like to try the IUD and then if she is unhappy with that she will go forth with her permanent sterilization idea.  I personally think this is a good choice for her.  She will return with her menses for Nexplanon removal and IUD insertion with her next visit.  Orders No orders of the defined types were placed in this encounter.   No orders of the defined types were placed in this encounter.     F/U  Return in about 3 weeks (around 08/05/2022). I spent 31 minutes  involved in the care of this patient preparing to see the patient by obtaining and reviewing her medical history (including labs, imaging tests and prior procedures), documenting clinical information in the electronic health record (EHR), counseling and coordinating care plans, writing and sending prescriptions, ordering tests or procedures and in direct communicating with the patient and medical staff discussing pertinent items from her history and physical exam.  Elonda Husky, M.D. 07/15/2022 2:16 PM

## 2022-07-24 ENCOUNTER — Other Ambulatory Visit: Payer: Self-pay | Admitting: Family Medicine

## 2022-07-24 MED ORDER — HYDROXYZINE PAMOATE 25 MG PO CAPS: 25.0000 mg | ORAL_CAPSULE | Freq: Three times a day (TID) | ORAL | 0 refills | Status: DC | PRN

## 2022-07-24 NOTE — Telephone Encounter (Signed)
Appointment 08/04/22 Requested Prescriptions  Pending Prescriptions Disp Refills   hydrOXYzine (VISTARIL) 25 MG capsule 30 capsule 0    Sig: Take 1 capsule (25 mg total) by mouth every 8 (eight) hours as needed.     Ear, Nose, and Throat:  Antihistamines 2 Passed - 07/24/2022  1:21 PM      Passed - Cr in normal range and within 360 days    Creatinine, Ser  Date Value Ref Range Status  01/19/2022 0.72 0.57 - 1.00 mg/dL Final         Passed - Valid encounter within last 12 months    Recent Outpatient Visits           3 weeks ago Adjustment disorder with mixed anxiety and depressed mood   [facility] Caro Laroche, DO   3 months ago Routine screening for STI (sexually transmitted infection)   [facility], Darl Householder, DO   5 months ago Community acquired pneumonia, unspecified laterality   [facility] [place], Marzella Schlein, MD   6 months ago RUQ pain   [facility] Merita Norton T, FNP   6 months ago Menorrhagia with irregular cycle   [facility], Marzella Schlein, MD       Future Appointments             In 1 week Bacigalupo, Marzella Schlein, MD [facility]   In 8 months Bacigalupo, Marzella Schlein, MD [facility]

## 2022-07-24 NOTE — Telephone Encounter (Signed)
Medication Refill - Medication: hydrOXYzine (VISTARIL) 25 MG capsule [[id number]]   Has the patient contacted their pharmacy? Yes.   (Agent: If no, request that the patient contact the pharmacy for the refill. If patient does not wish to contact the pharmacy document the reason why and proceed with request.) (Agent: If yes, when and what did the pharmacy advise?)  Preferred Pharmacy (with phone number or street name): [id number] [place], [place] - [address] AT [facility] [address] [place] [address] [place], [place] [address] Phone: [phone]  Fax: [phone]  Has the patient been seen for an appointment in the last year OR does the patient have an upcoming appointment? Yes.    Agent: Please be advised that RX refills may take up to 3 business days. We ask that you follow-up with your pharmacy.

## 2022-07-28 ENCOUNTER — Other Ambulatory Visit: Payer: Medicaid Other | Admitting: Licensed Clinical Social Worker

## 2022-07-28 NOTE — Patient Instructions (Signed)
Crystal Hansen ,   The [facility] Managed Care Team is available to provide assistance to you with your healthcare needs at no cost and as a benefit of your [facility] Health plan. We have been unable to reach you on 3 separate attempts. The care management team is available to assist with your healthcare needs at any time. Please do not hesitate to contact me at the number below. .   Thank you,   Dickie La, BSW, MSW, LCSW Managed Medicaid LCSW [facility]  [email or web address] Phone: [phone]

## 2022-07-28 NOTE — Patient Outreach (Signed)
  Medicaid Managed Care   Unsuccessful Attempt Note   07/28/2022 Name: Crystal Hansen MRN: [id number] DOB: Sep 24, 1990  Referred by: Erasmo Downer, MD Reason for referral : No chief complaint on file.   An unsuccessful telephone outreach was attempted today. The patient was referred to the case management team for assistance with care management and care coordination.    Follow Up Plan: The Managed Medicaid care management team will reach out to the patient again over the next 30 days.   Dickie La, BSW, MSW, [employer] Managed Medicaid LCSW [facility]  [facility] [email or web address] Phone: [phone]

## 2022-07-29 NOTE — Progress Notes (Signed)
I,Sulibeya S Dimas,acting as a Neurosurgeon for Shirlee Latch, MD.,have documented all relevant documentation on the behalf of Shirlee Latch, MD,as directed by  Shirlee Latch, MD while in the presence of Shirlee Latch, MD.     Established patient visit   Patient: Crystal Hansen   DOB: 1991-08-20   31 y.o. Female  MRN: [id number] Visit Date: 08/04/2022  Today's healthcare provider: Shirlee Latch, MD   Chief Complaint  Patient presents with   Depression   Anxiety   Subjective    HPI  Depression/Anxiety, Follow-up  She  was last seen for this 1 months ago. Changes made at last visit include start Lexapro with atarax for panic attacks. Referred to counseling.   She reports excellent compliance with treatment. She is not having side effects.   She reports excellent tolerance of treatment. Current symptoms include:  denies any symptoms today She feels she is Improved since last visit.     08/04/2022   10:52 AM 07/08/2022   11:02 AM 06/29/2022    9:00 AM  Depression screen PHQ 2/9  Decreased Interest 0 2 2  Down, Depressed, Hopeless 0 2 2  PHQ - 2 Score 0 4 4  Altered sleeping 0 2 2  Tired, decreased energy 1 1 2   Change in appetite 2 2 2   Feeling bad or failure about yourself  0 2 2  Trouble concentrating 1 2 2   Moving slowly or fidgety/restless 0 0 1  Suicidal thoughts 0 1 1  PHQ-9 Score 4 14 16   Difficult doing work/chores Not difficult at all Very difficult Very difficult      08/04/2022   10:52 AM 06/29/2022    8:59 AM 09/30/2021    9:25 AM 06/30/2021   12:58 PM  GAD 7 : Generalized Anxiety Score  Nervous, Anxious, on Edge 1 2 1 1   Control/stop worrying 0 2 1 0  Worry too much - different things 1 2 1  0  Trouble relaxing 1 2 2  0  Restless 0 2 1 0  Easily annoyed or irritable 1 2 3 1   Afraid - awful might happen 0 2 1 0  Total GAD 7 Score 4 14 10 2   Anxiety Difficulty Not difficult at all Very difficult Somewhat difficult Not  difficult at all   -----------------------------------------------------------------------------------------   Medications: Outpatient Medications Prior to Visit  Medication Sig   cetirizine (ZYRTEC) 10 MG tablet Take 1 tablet (10 mg total) by mouth as needed for allergies.   etonogestrel (NEXPLANON) 68 MG IMPL implant 1 each by Subdermal route once.   ibuprofen (ADVIL) 800 MG tablet Take 1 tablet (800 mg total) by mouth every 8 (eight) hours as needed.   [DISCONTINUED] escitalopram (LEXAPRO) 10 MG tablet Take 1 tablet (10 mg total) by mouth daily.   [DISCONTINUED] hydrOXYzine (VISTARIL) 25 MG capsule Take 1 capsule (25 mg total) by mouth every 8 (eight) hours as needed.   No facility-administered medications prior to visit.    Review of Systems  Constitutional:  Positive for appetite change and fatigue.  Respiratory:  Negative for chest tightness and shortness of breath.   Cardiovascular:  Negative for chest pain.  Psychiatric/Behavioral:  Negative for agitation, decreased concentration, sleep disturbance and suicidal ideas. The patient is nervous/anxious and is hyperactive.        Objective    BP 118/70 (BP Location: Left Arm, Patient Position: Sitting, Cuff Size: Large)   Pulse 65   Temp 98.5 F (36.9 C) (Oral)   Resp  16   Wt 237 lb (107.5 kg)   LMP 07/09/2022 (Approximate)   BMI 38.84 kg/m  BP Readings from Last 3 Encounters:  08/04/22 118/70  07/15/22 122/73  06/29/22 129/68   Wt Readings from Last 3 Encounters:  08/04/22 237 lb (107.5 kg)  07/15/22 244 lb (110.7 kg)  06/29/22 252 lb (114.3 kg)      Physical Exam Vitals reviewed.  Constitutional:      General: She is not in acute distress.    Appearance: She is well-developed.  HENT:     Head: Normocephalic and atraumatic.  Eyes:     General: No scleral icterus.    Conjunctiva/sclera: Conjunctivae normal.  Cardiovascular:     Rate and Rhythm: Normal rate and regular rhythm.  Pulmonary:     Effort:  Pulmonary effort is normal. No respiratory distress.  Skin:    General: Skin is warm and dry.     Findings: No rash.  Neurological:     Mental Status: She is alert and oriented to person, place, and time.  Psychiatric:        Mood and Affect: Mood normal.        Behavior: Behavior normal.       No results found for any visits on 08/04/22.  Assessment & Plan     Problem List Items Addressed This Visit       Other   Adjustment disorder with mixed anxiety and depressed mood - Primary    Chronic and well controlled Still looking into counseling Continue lexapro at current dose Continue hydroxyzine prn        Return for as scheduled.      I, Shirlee Latch, MD, have reviewed all documentation for this visit. The documentation on 08/04/22 for the exam, diagnosis, procedures, and orders are all accurate and complete.   Lashena Signer, Marzella Schlein, MD, MPH [facility]

## 2022-08-04 ENCOUNTER — Ambulatory Visit (INDEPENDENT_AMBULATORY_CARE_PROVIDER_SITE_OTHER): Payer: Medicaid Other | Admitting: Family Medicine

## 2022-08-04 ENCOUNTER — Encounter: Payer: Self-pay | Admitting: Family Medicine

## 2022-08-04 VITALS — BP 118/70 | HR 65 | Temp 98.5°F | Resp 16 | Wt 237.0 lb

## 2022-08-04 DIAGNOSIS — F4323 Adjustment disorder with mixed anxiety and depressed mood: Secondary | ICD-10-CM | POA: Diagnosis not present

## 2022-08-04 MED ORDER — ESCITALOPRAM OXALATE 10 MG PO TABS: 10.0000 mg | ORAL_TABLET | Freq: Every day | ORAL | 1 refills | Status: DC

## 2022-08-04 MED ORDER — HYDROXYZINE PAMOATE 25 MG PO CAPS
25.0000 mg | ORAL_CAPSULE | Freq: Three times a day (TID) | ORAL | 1 refills | Status: DC | PRN
Start: 2022-08-04 — End: 2022-11-16

## 2022-08-04 NOTE — Assessment & Plan Note (Signed)
Chronic and well controlled Still looking into counseling Continue lexapro at current dose Continue hydroxyzine prn

## 2022-08-06 ENCOUNTER — Other Ambulatory Visit: Payer: Medicaid Other | Admitting: Licensed Clinical Social Worker

## 2022-08-06 NOTE — Patient Instructions (Signed)
Visit Information  Crystal Hansen was given information about Medicaid Managed Care team care coordination services as a part of their [place] Medicaid benefit. Crystal Hansen verbally consented to engagement with the [facility] Managed Care team.   If you are experiencing a medical emergency, please call 911 or report to your local emergency department or urgent care.   If you have a non-emergency medical problem during routine business hours, please contact your provider's office and ask to speak with a nurse.   For questions related to your [facility], please call: [phone] or visit the homepage here: [email or web address]  If you would like to schedule transportation through your [facility], please call the following number at least 2 days in advance of your appointment: [phone]   Rides for urgent appointments can also be made after hours by calling Member Services.  Call the [place] at [phone], at any time, 24 hours a day, 7 days a week. If you are in danger or need immediate medical attention call 911.  If you would like help to quit smoking, call 1-800-QUIT-NOW 405 281 4369) OR Espaol: 1-855-Djelo-Ya (4-097-353-2992) o para ms informacin haga clic aqu or Text READY to 200-400 to register via text   Following is a copy of your plan of care:  Care Plan : LCSW Plan of Care  Updates made by Greg Cutter, LCSW since 08/06/2022 12:00 AM     Problem: Coping Skills (General Plan of Care)      Long-Range Goal: Coping Skills Enhanced   Start Date: 07/08/2022  Recent Progress: On track  Priority: Medium  Note:   Priority: High  Timeframe:  Long-Range Goal Priority:  High Start Date:   07/08/22            Expected End Date:  ongoing                     Follow Up Date-- 08/13/22 at 47  - keep 90 percent  of scheduled appointments -consider counseling or psychiatry -consider bumping up your self-care  -consider creating a stronger support network   Why is this important?             Beating depression may take some time.            If you don't feel better right away, don't give up on your treatment plan.    Current barriers:   Chronic Mental Health needs related to depression, stress and adjustment disorder. Patient requires Support, Education, Resources, Referrals, Advocacy, and Care Coordination, in order to meet Unmet Mental Health Needs. Patient will implement clinical interventions discussed today to decrease symptoms of depression and increase knowledge and/or ability of: coping skills. Mental Health Concerns and Social Isolation Patient lacks knowledge of available community counseling agencies and resources.  Clinical Goal(s): verbalize understanding of plan for management of Adjustment Disorder, Depression, and Stress and demonstrate a reduction in symptoms. Patient will connect with a provider for ongoing mental health treatment, increase coping skills, healthy habits, self-management skills, and stress reduction        Patient Goals/Self-Care Activities: Over the next 120 days Attend scheduled medical appointments Utilize healthy coping skills and supportive resources discussed Contact PCP with any questions or concerns Keep 90 percent of counseling appointments Call your insurance provider for more information about your Enhanced Benefits  Check out counseling resources provided  Begin personal counseling with LCSW, to reduce and manage  symptoms of Depression and Stress, until well-established with mental health provider Accept all calls from representative with [facility] in an effort to establish ongoing mental health counseling and supportive services. Incorporate into daily practice - relaxation techniques, deep breathing exercises, and mindfulness meditation  strategies. Talk about feelings with friends, family members, spiritual advisor, etc. Contact LCSW directly [phone]), if you have questions, need assistance, or if additional social work needs are identified between now and our next scheduled telephone outreach call. Call 988 for mental health hotline/crisis line if needed (24/7 available) Try techniques to reduce symptoms of anxiety/negative thinking (deep breathing, distraction, positive self talk, etc)  - develop a personal safety plan - develop a plan to deal with triggers like holidays, anniversaries - exercise at least 2 to 3 times per week - have a plan for how to handle bad days - journal feelings and what helps to feel better or worse - spend time or talk with others at least 2 to 3 times per week - watch for early signs of feeling worse - begin personal counseling - call and visit an old friend - check out volunteer opportunities - join a support group - laugh; watch a funny movie or comedian - learn and use visualization or guided imagery - perform a random act of kindness - practice relaxation or meditation daily - start or continue a personal journal - practice positive thinking and self-talk -continue with compliance of taking medication  -identify current effective and ineffective coping strategies.  -implement positive self-talk in care to increase self-esteem, confidence and feelings of control.  -consider alternative and complementary therapy approaches such as meditation, mindfulness or yoga.  -journaling, prayer, worship services, meditation or pastoral counseling.  -increase participation in pleasurable group activities such as hobbies, singing, sports or volunteering).  -consider the use of meditative movement therapy such as tai chi, yoga or qigong.  -start a regular daily exercise program based on tolerance, ability and patient choice to support positive thinking and activity    If you are experiencing a  Mental Health or [place] or need someone to talk to, please call the Suicide and Crisis Lifeline: 988    Patient Goals: Follow up goal

## 2022-08-06 NOTE — Patient Outreach (Signed)
Medicaid Managed Care Social Work Note  08/06/2022 Name:  Crystal Hansen MRN:  [id number] DOB:  11-19-1990  Crystal Hansen is an 31 y.o. year old female who is a primary patient of Bacigalupo, Dionne Bucy, MD.  The Medicaid Managed Care Coordination team was consulted for assistance with:  [place] and Resources  Ms. Belardo was given information about Medicaid Managed Care Coordination team services today. [place] Patient agreed to services and verbal consent obtained.  Engaged with patient  for by telephone forfollow up visit in response to referral for case management and/or care coordination services.   Assessments/Interventions:  Review of past medical history, allergies, medications, health status, including review of consultants reports, laboratory and other test data, was performed as part of comprehensive evaluation and provision of chronic care management services.  SDOH: (Social Determinant of Health) assessments and interventions performed: SDOH Interventions    Flowsheet Row Patient Outreach Telephone from 08/06/2022 in [place] Office Visit from 08/04/2022 in [place] Patient Outreach Telephone from 07/08/2022 in [place] Coordination Office Visit from 06/29/2022 in [place] Office Visit from 02/20/2019 in [place]  SDOH Interventions       Depression Interventions/Treatment  -- Currently on Treatment Medication, Counseling Medication, Counseling Patient refuses Treatment  Stress Interventions Provide Counseling, Max, Provide Counseling -- --       Advanced Directives Status:  See Care Plan for related entries.  Care Plan                 Allergies  Allergen Reactions   Peanut Oil Anaphylaxis   Shellfish Allergy Anaphylaxis   Amoxapine And Related     Apple Juice Other (See Comments)   Amoxicillin Rash    Medications Reviewed Today     Reviewed by Greg Cutter, LCSW (Social Worker) on 08/06/22 at 96  Med List Status: <None>   Medication Order Taking? Sig Documenting Provider Last Dose Status Informant  cetirizine (ZYRTEC) 10 MG tablet [id number] No Take 1 tablet (10 mg total) by mouth as needed for allergies. [name], FNP Taking Active   escitalopram (LEXAPRO) 10 MG tablet [id number]  Take 1 tablet (10 mg total) by mouth daily. [name], MD  Active   etonogestrel (NEXPLANON) 68 MG IMPL implant [id number] No 1 each by Subdermal route once. [provider] Taking Active   hydrOXYzine (VISTARIL) 25 MG capsule [id number]  Take 1 capsule (25 mg total) by mouth every 8 (eight) hours as needed. [name], MD  Active   ibuprofen (ADVIL) 800 MG tablet [id number] No Take 1 tablet (800 mg total) by mouth every 8 (eight) hours as needed. [name], MD Taking Active             Patient Active Problem List   Diagnosis Date Noted   Labial lesion 04/01/2022   RUQ pain 01/19/2022   Dysmenorrhea 01/08/2022   Menorrhagia with irregular cycle 01/08/2022   Concentration deficit 09/30/2021   Morbid obesity ([place]) 03/24/2021   SUI (stress urinary incontinence, female) 03/24/2021   Urinary frequency 03/24/2021   Adjustment disorder with mixed anxiety and depressed mood 03/24/2021   Chronic left-sided low back pain without sciatica 09/13/2020   Meralgia paresthetica of left side 09/13/2020   Allergic rhinitis 02/23/2019   Tobacco use disorder 02/15/2019   Marijuana use 12/10/2012    Conditions to be addressed/monitored  per PCP order:  Anxiety and Depression  Care Plan : LCSW Plan of Care  Updates made by Greg Cutter, LCSW since 08/06/2022 12:00 AM     Problem: Coping Skills (General Plan of Care)      Long-Range Goal: Coping Skills Enhanced   Start Date: 07/08/2022  Recent Progress:  On track  Priority: Medium  Note:   Priority: High  Timeframe:  Long-Range Goal Priority:  High Start Date:   07/08/22            Expected End Date:  ongoing                     Follow Up Date-- 08/13/22 at 62  - keep 90 percent of scheduled appointments -consider counseling or psychiatry -consider bumping up your self-care  -consider creating a stronger support network   Why is this important?             Beating depression may take some time.            If you don't feel better right away, don't give up on your treatment plan.    Current barriers:   Chronic Mental Health needs related to depression, stress and adjustment disorder. Patient requires Support, Education, Resources, Referrals, Advocacy, and Care Coordination, in order to meet Unmet Mental Health Needs. Patient will implement clinical interventions discussed today to decrease symptoms of depression and increase knowledge and/or ability of: coping skills. Mental Health Concerns and Social Isolation Patient lacks knowledge of available community counseling agencies and resources.  Clinical Goal(s): verbalize understanding of plan for management of Adjustment Disorder, Depression, and Stress and demonstrate a reduction in symptoms. Patient will connect with a provider for ongoing mental health treatment, increase coping skills, healthy habits, self-management skills, and stress reduction        Clinical Interventions:  Assessed patient's previous and current treatment, coping skills, support system and barriers to care. Patient cannot take Zoloft as it previously caused adverse side effects that led to suicidal ideations and almost attempts. Crisis resource information provided to patient.  Verbalization of feelings encouraged, motivational interviewing employed Emotional support provided, positive coping strategies explored. Establishing healthy boundaries emphasized and healthy self-care education provided. Patient has  had conflict with spouse which brought about this new added stress.  Patient was educated on available mental health resources within their area that accept Medicaid and offer counseling and psychiatry. Patient only wants counseling. Patient reports that she recently started on a medicine for depression and one for her anxiety. She reports already seeing improvement in her symptoms.  Patient reports significant worsening anxiety and depression impacting her ability to function appropriately and carry out daily task. Patient is agreeable to referral to [facility] for counseling. [facility] LCSW made referral on 07/08/22. LCSW provided education on relaxation techniques such as meditation, deep breathing, massage, grounding exercises or yoga that can activate the body's relaxation response and ease symptoms of stress and anxiety. LCSW ask that when pt is struggling with difficult emotions and racing thoughts that they start this relaxation response process. LCSW provided extensive education on healthy coping skills for anxiety. SW used active and reflective listening, validated patient's feelings/concerns, and provided emotional support. Patient will work on implementing appropriate self-care habits into their daily routine such as: staying positive, writing a gratitude list, drinking water, staying active around the house, taking their medications as prescribed, combating negative thoughts or emotions and staying connected with their family and friends. Positive reinforcement  provided for this decision to work on this. UPDATE- Patient reports that she has not had the chance to follow up on counseling referral yet but will do so over the next 2 weeks in order to complete intake for [facility]. Reminder email sent to patient as well today. 08/06/22- Patient's phone is now working but she is afraid that she missed a phone call from [place] regarding her counseling referral. Patient has a new email  too. [facility] LCSW and patient completed joint call to [facility] and left a message regarding this. [facility] LCSW sent an email to staff inquiring about this as well. Agency reports that they will plan on contacting patient for a second attempt today but will have to close referral after 3 attempts. Patient made aware.   Motivational Interviewing employed Depression screen reviewed  PHQ2/ PHQ9 completed or reviewed  Mindfulness or Relaxation training provided Active listening / Reflection utilized  Advance Care and HCPOA education provided Emotional Support Provided Problem [place] strategies reviewed Provided psychoeducation for mental health needs  Provided brief CBT  Reviewed mental health medications and discussed importance of compliance:  Quality of sleep assessed & Sleep Hygiene techniques promoted  Participation in counseling encouraged  Verbalization of feelings encouraged  Suicidal Ideation/Homicidal Ideation assessed: Patient denies SI/HI  Review resources, discussed options and provided patient information about  [place] care team collaboration (see longitudinal plan of care) Patient Goals/Self-Care Activities: Over the next 120 days Attend scheduled medical appointments Utilize healthy coping skills and supportive resources discussed Contact PCP with any questions or concerns Keep 90 percent of counseling appointments Call your insurance provider for more information about your Enhanced Benefits  Check out counseling resources provided  Begin personal counseling with LCSW, to reduce and manage symptoms of Depression and Stress, until well-established with mental health provider Accept all calls from representative with [facility] in an effort to establish ongoing mental health counseling and supportive services. Incorporate into daily practice - relaxation techniques, deep breathing exercises, and mindfulness meditation  strategies. Talk about feelings with friends, family members, spiritual advisor, etc. Contact LCSW directly [phone]), if you have questions, need assistance, or if additional social work needs are identified between now and our next scheduled telephone outreach call. Call 988 for mental health hotline/crisis line if needed (24/7 available) Try techniques to reduce symptoms of anxiety/negative thinking (deep breathing, distraction, positive self talk, etc)  - develop a personal safety plan - develop a plan to deal with triggers like holidays, anniversaries - exercise at least 2 to 3 times per week - have a plan for how to handle bad days - journal feelings and what helps to feel better or worse - spend time or talk with others at least 2 to 3 times per week - watch for early signs of feeling worse - begin personal counseling - call and visit an old friend - check out volunteer opportunities - join a support group - laugh; watch a funny movie or comedian - learn and use visualization or guided imagery - perform a random act of kindness - practice relaxation or meditation daily - start or continue a personal journal - practice positive thinking and self-talk -continue with compliance of taking medication  -identify current effective and ineffective coping strategies.  -implement positive self-talk in care to increase self-esteem, confidence and feelings of control.  -consider alternative and complementary therapy approaches such as meditation, mindfulness or yoga.  -journaling, prayer, worship services, meditation or pastoral counseling.  -  increase participation in pleasurable group activities such as hobbies, singing, sports or volunteering).  -consider the use of meditative movement therapy such as tai chi, yoga or qigong.  -start a regular daily exercise program based on tolerance, ability and patient choice to support positive thinking and activity    If you are experiencing a  Mental Health or [place] or need someone to talk to, please call the Suicide and Crisis Lifeline: 988    Patient Goals: Follow up goal      Follow up:  Patient agrees to Care Plan and Follow-up.  Plan: The Managed Medicaid care management team will reach out to the patient again over the next 30 days.  Date/time of next scheduled Social Work care management/care coordination outreach:  08/13/22 at [place], [place], MSW, [place] Medicaid LCSW [email or web address] Phone: [phone]

## 2022-08-11 ENCOUNTER — Ambulatory Visit (INDEPENDENT_AMBULATORY_CARE_PROVIDER_SITE_OTHER): Payer: Medicaid Other | Admitting: Obstetrics and Gynecology

## 2022-08-11 ENCOUNTER — Encounter: Payer: Self-pay | Admitting: Obstetrics and Gynecology

## 2022-08-11 VITALS — BP 105/73 | HR 121 | Ht 65.5 in | Wt 238.5 lb

## 2022-08-11 DIAGNOSIS — Z30430 Encounter for insertion of intrauterine contraceptive device: Secondary | ICD-10-CM | POA: Diagnosis not present

## 2022-08-11 DIAGNOSIS — Z3046 Encounter for surveillance of implantable subdermal contraceptive: Secondary | ICD-10-CM

## 2022-08-11 NOTE — Progress Notes (Signed)
Patient presents today for Nexplanon removal and Mirena IUD insertion. Consent signed. No additional questions at this time.

## 2022-08-11 NOTE — Progress Notes (Signed)
HPI:      [name] is a 31 y.o. [phone] who LMP was Patient's last menstrual period was 07/09/2022 (approximate).  Subjective:   She presents today for IUD insertion after Nexplanon removal.    Hx: The following portions of the patient's history were reviewed and updated as appropriate:             She  has a past medical history of Allergy, Anxiety, Arthritis, Cesarean wound infection (05/03/2020), and Headache. She does not have any pertinent problems on file. She  has a past surgical history that includes Appendectomy; Cesarean section multi-gestational (04/16/2020); and Wisdom tooth extraction. Her family history includes Anemia in her paternal grandmother; Cancer in her maternal grandmother; Cataracts in her maternal grandfather and mother; Colon cancer in her paternal grandfather; Dementia in her paternal grandmother; Diabetes in her maternal grandfather and mother; Eczema in her father; Heart Problems in her maternal aunt; Heart disease in her maternal grandfather and mother; Kidney disease in her mother; Neuropathy in her mother. She  reports that she has been smoking cigars and cigarettes. She has a 6.50 pack-year smoking history. She has never used smokeless tobacco. She reports current alcohol use of about 3.0 - 4.0 standard drinks of alcohol per week. She reports current drug use. Drug: Marijuana. She has a current medication list which includes the following prescription(s): cetirizine, escitalopram, nexplanon, hydroxyzine, and ibuprofen. She is allergic to peanut oil, shellfish allergy, amoxapine and related, apple juice, and amoxicillin.       Review of Systems:  Review of Systems  Constitutional: Denied constitutional symptoms, night sweats, recent illness, fatigue, fever, insomnia and weight loss.  Eyes: Denied eye symptoms, eye pain, photophobia, vision change and visual disturbance.  Ears/Nose/Throat/Neck: Denied ear, nose, throat or neck symptoms, hearing loss,  nasal discharge, sinus congestion and sore throat.  Cardiovascular: Denied cardiovascular symptoms, arrhythmia, chest pain/pressure, edema, exercise intolerance, orthopnea and palpitations.  Respiratory: Denied pulmonary symptoms, asthma, pleuritic pain, productive sputum, cough, dyspnea and wheezing.  Gastrointestinal: Denied, gastro-esophageal reflux, melena, nausea and vomiting.  Genitourinary: Denied genitourinary symptoms including symptomatic vaginal discharge, pelvic relaxation issues, and urinary complaints.  Musculoskeletal: Denied musculoskeletal symptoms, stiffness, swelling, muscle weakness and myalgia.  Dermatologic: Denied dermatology symptoms, rash and scar.  Neurologic: Denied neurology symptoms, dizziness, headache, neck pain and syncope.  Psychiatric: Denied psychiatric symptoms, anxiety and depression.  Endocrine: Denied endocrine symptoms including hot flashes and night sweats.   Meds:   Current Outpatient Medications on File Prior to Visit  Medication Sig Dispense Refill   cetirizine (ZYRTEC) 10 MG tablet Take 1 tablet (10 mg total) by mouth as needed for allergies. 90 tablet 3   escitalopram (LEXAPRO) 10 MG tablet Take 1 tablet (10 mg total) by mouth daily. 90 tablet 1   etonogestrel (NEXPLANON) 68 MG IMPL implant 1 each by Subdermal route once.     hydrOXYzine (VISTARIL) 25 MG capsule Take 1 capsule (25 mg total) by mouth every 8 (eight) hours as needed. 90 capsule 1   ibuprofen (ADVIL) 800 MG tablet Take 1 tablet (800 mg total) by mouth every 8 (eight) hours as needed. 270 tablet 0   No current facility-administered medications on file prior to visit.    Objective:     Vitals:   08/11/22 1024  BP: 105/73  Pulse: (!) 121    Nexplanon removal Procedure note - The implant was noted in the patient's arm and the end was identified. The skin was cleansed with a Betadine solution.  A small injection of subcutaneous lidocaine with epinephrine was given over the end of  the rod. An incision was made at the end of the implant. The rod was noted in the incision and grasp with a hemostat. The capsule was nicked and the rod was again grasped and removed. It was noted to be intact.  The patient's skin was again cleansed.  Steri-Strips were placed approximating the incision. Hemostasis was noted.    Physical examination   Pelvic:   Vulva: Normal appearance.  No lesions.  Vagina: No lesions or abnormalities noted.  Support: Normal pelvic support.  Urethra No masses tenderness or scarring.  Meatus Normal size without lesions or prolapse.  Cervix: Normal appearance.  No lesions.  Anus: Normal exam.  No lesions.  Perineum: Normal exam.  No lesions.        Bimanual   Uterus: Normal size.  Non-tender.  Mobile.  AV.  Adnexae: No masses.  Non-tender to palpation.  Cul-de-sac: Negative for abnormality.   IUD Procedure Pt has read the booklet and signed the appropriate forms regarding the Mirena IUD.  All of her questions have been answered.   The cervix was cleansed with betadine solution.  After sounding the uterus and noting the position, the IUD was placed in the usual manner without problem.  The string was cut to the appropriate length.  The patient tolerated the procedure well.            NDC # = [id number]   Assessment:    G2P2003 Patient Active Problem List   Diagnosis Date Noted   Labial lesion 04/01/2022   RUQ pain 01/19/2022   Dysmenorrhea 01/08/2022   Menorrhagia with irregular cycle 01/08/2022   Concentration deficit 09/30/2021   Morbid obesity (HCC) 03/24/2021   SUI (stress urinary incontinence, female) 03/24/2021   Urinary frequency 03/24/2021   Adjustment disorder with mixed anxiety and depressed mood 03/24/2021   Chronic left-sided low back pain without sciatica 09/13/2020   Meralgia paresthetica of left side 09/13/2020   Allergic rhinitis 02/23/2019   Tobacco use disorder 02/15/2019   Marijuana use 12/10/2012     1. Nexplanon  removal   2. Encounter for IUD insertion       Plan:             F/U  Return in about 4 weeks (around 09/08/2022) for For IUD f/u.  Elonda Husky, M.D. 08/11/2022 4:12 PM

## 2022-08-12 ENCOUNTER — Encounter: Payer: Self-pay | Admitting: Obstetrics and Gynecology

## 2022-08-13 ENCOUNTER — Other Ambulatory Visit: Payer: Medicaid Other | Admitting: Licensed Clinical Social Worker

## 2022-08-13 NOTE — Patient Instructions (Signed)
Visit Information  Crystal Hansen was given information about Medicaid Managed Care team care coordination services as a part of their [place] Medicaid benefit. [place] verbally consented to engagement with the [facility] Managed Care team.   If you are experiencing a medical emergency, please call 911 or report to your local emergency department or urgent care.   If you have a non-emergency medical problem during routine business hours, please contact your provider's office and ask to speak with a nurse.   For questions related to your [facility], please call: [phone] or visit the homepage here: [email or web address]  If you would like to schedule transportation through your [facility], please call the following number at least 2 days in advance of your appointment: [phone]   Rides for urgent appointments can also be made after hours by calling Member Services.  Call the [place] at [phone], at any time, 24 hours a day, 7 days a week. If you are in danger or need immediate medical attention call 911.  If you would like help to quit smoking, call 1-800-QUIT-NOW 9062122356) OR Espaol: 1-855-Djelo-Ya (9-326-712-4580) o para ms informacin haga clic aqu or Text READY to 200-400 to register via text   Following is a copy of your plan of care:  Care Plan : LCSW Plan of Care  Updates made by Greg Cutter, LCSW since 08/13/2022 12:00 AM     Problem: Coping Skills (General Plan of Care)      Long-Range Goal: Coping Skills Enhanced   Start Date: 07/08/2022  Recent Progress: On track  Priority: Medium  Note:   Priority: High  Timeframe:  Long-Range Goal Priority:  High Start Date:   07/08/22            Expected End Date:  ongoing                     Follow Up Date-- 09/02/22 at 230 pm  - keep 90 percent  of scheduled appointments -consider counseling or psychiatry -consider bumping up your self-care  -consider creating a stronger support network   Why is this important?             Beating depression may take some time.            If you don't feel better right away, don't give up on your treatment plan.    Current barriers:   Chronic Mental Health needs related to depression, stress and adjustment disorder. Patient requires Support, Education, Resources, Referrals, Advocacy, and Care Coordination, in order to meet Unmet Mental Health Needs. Patient will implement clinical interventions discussed today to decrease symptoms of depression and increase knowledge and/or ability of: coping skills. Mental Health Concerns and Social Isolation Patient lacks knowledge of available community counseling agencies and resources.  Clinical Goal(s): verbalize understanding of plan for management of Adjustment Disorder, Depression, and Stress and demonstrate a reduction in symptoms. Patient will connect with a provider for ongoing mental health treatment, increase coping skills, healthy habits, self-management skills, and stress reduction        Patient Goals/Self-Care Activities: Over the next 120 days Attend scheduled medical appointments Utilize healthy coping skills and supportive resources discussed Contact PCP with any questions or concerns Keep 90 percent of counseling appointments Call your insurance provider for more information about your Enhanced Benefits  Check out counseling resources provided  Begin personal counseling with LCSW, to reduce and  manage symptoms of Depression and Stress, until well-established with mental health provider Accept all calls from representative with [facility] in an effort to establish ongoing mental health counseling and supportive services. Incorporate into daily practice - relaxation techniques, deep breathing exercises, and mindfulness meditation  strategies. Talk about feelings with friends, family members, spiritual advisor, etc. Contact LCSW directly [phone]), if you have questions, need assistance, or if additional social work needs are identified between now and our next scheduled telephone outreach call. Call 988 for mental health hotline/crisis line if needed (24/7 available) Try techniques to reduce symptoms of anxiety/negative thinking (deep breathing, distraction, positive self talk, etc)  - develop a personal safety plan - develop a plan to deal with triggers like holidays, anniversaries - exercise at least 2 to 3 times per week - have a plan for how to handle bad days - journal feelings and what helps to feel better or worse - spend time or talk with others at least 2 to 3 times per week - watch for early signs of feeling worse - begin personal counseling - call and visit an old friend - check out volunteer opportunities - join a support group - laugh; watch a funny movie or comedian - learn and use visualization or guided imagery - perform a random act of kindness - practice relaxation or meditation daily - start or continue a personal journal - practice positive thinking and self-talk -continue with compliance of taking medication  -identify current effective and ineffective coping strategies.  -implement positive self-talk in care to increase self-esteem, confidence and feelings of control.  -consider alternative and complementary therapy approaches such as meditation, mindfulness or yoga.  -journaling, prayer, worship services, meditation or pastoral counseling.  -increase participation in pleasurable group activities such as hobbies, singing, sports or volunteering).  -consider the use of meditative movement therapy such as tai chi, yoga or qigong.  -start a regular daily exercise program based on tolerance, ability and patient choice to support positive thinking and activity    If you are experiencing a  Mental Health or [place] or need someone to talk to, please call the Suicide and Crisis Lifeline: 988    Patient Goals: Follow up goal

## 2022-08-13 NOTE — Patient Outreach (Signed)
Medicaid Managed Care Social Work Note  08/13/2022 Name:  Crystal Hansen MRN:  [id number] DOB:  [birth year]  Crystal Hansen is an 31 y.o. year old female who is a primary patient of Bacigalupo, Dionne Bucy, MD.  The Medicaid Managed Care Coordination team was consulted for assistance with:  [place] and Resources  [name] was given information about Medicaid Managed Care Coordination team services today. [name] agreed to services and verbal consent obtained.  Engaged with patient  for by telephone forfollow up visit in response to referral for case management and/or care coordination services.   Assessments/Interventions:  Review of past medical history, allergies, medications, health status, including review of consultants reports, laboratory and other test data, was performed as part of comprehensive evaluation and provision of chronic care management services.  SDOH: (Social Determinant of Health) assessments and interventions performed: SDOH Interventions    Flowsheet Row Patient Outreach Telephone from 08/06/2022 in [place] Office Visit from 08/04/2022 in [place] Patient Outreach Telephone from 07/08/2022 in [place] Coordination Office Visit from 06/29/2022 in [place] Office Visit from 02/20/2019 in [place]  SDOH Interventions       Depression Interventions/Treatment  -- Currently on Treatment Medication, Counseling Medication, Counseling Patient refuses Treatment  Stress Interventions Provide Counseling, [place], Provide Counseling -- --       Advanced Directives Status:  Not addressed in this encounter.  Care Plan                 Allergies  Allergen Reactions   Peanut Oil Anaphylaxis   Shellfish Allergy Anaphylaxis   Amoxapine And Related     Apple Juice Other (See Comments)   Amoxicillin Rash    Medications Reviewed Today     Reviewed by [name], MD (Physician) on 08/11/22 at [place] List Status: <None>   Medication Order Taking? Sig Documenting Provider Last Dose Status Informant  cetirizine (ZYRTEC) 10 MG tablet [id number] Yes Take 1 tablet (10 mg total) by mouth as needed for allergies. [name], FNP Taking Active   escitalopram (LEXAPRO) 10 MG tablet [id number] Yes Take 1 tablet (10 mg total) by mouth daily. [name], MD Taking Active   etonogestrel (NEXPLANON) 68 MG IMPL implant [id number] Yes 1 each by Subdermal route once. [provider] Taking Active   hydrOXYzine (VISTARIL) 25 MG capsule [id number] Yes Take 1 capsule (25 mg total) by mouth every 8 (eight) hours as needed. [name], MD Taking Active   ibuprofen (ADVIL) 800 MG tablet [id number] Yes Take 1 tablet (800 mg total) by mouth every 8 (eight) hours as needed. [name], MD Taking Active             Patient Active Problem List   Diagnosis Date Noted   Labial lesion 04/01/2022   RUQ pain 01/19/2022   Dysmenorrhea 01/08/2022   Menorrhagia with irregular cycle 01/08/2022   Concentration deficit 09/30/2021   Morbid obesity ([place]) 03/24/2021   SUI (stress urinary incontinence, female) 03/24/2021   Urinary frequency 03/24/2021   Adjustment disorder with mixed anxiety and depressed mood 03/24/2021   Chronic left-sided low back pain without sciatica 09/13/2020   Meralgia paresthetica of left side 09/13/2020   Allergic rhinitis 02/23/2019   Tobacco use disorder 02/15/2019   Marijuana use 12/10/2012    Conditions to be addressed/monitored per PCP  order:  Depression  Care Plan : LCSW Plan of Care  Updates made by Greg Cutter, LCSW since 08/13/2022 12:00 AM     Problem: Coping Skills (General Plan of Care)      Long-Range Goal: Coping Skills Enhanced   Start Date: 07/08/2022  Recent  Progress: On track  Priority: Medium  Note:   Priority: High  Timeframe:  Long-Range Goal Priority:  High Start Date:   07/08/22            Expected End Date:  ongoing                     Follow Up Date-- 09/02/22 at 230 pm  - keep 90 percent of scheduled appointments -consider counseling or psychiatry -consider bumping up your self-care  -consider creating a stronger support network   Why is this important?             Beating depression may take some time.            If you don't feel better right away, don't give up on your treatment plan.    Current barriers:   Chronic Mental Health needs related to depression, stress and adjustment disorder. Patient requires Support, Education, Resources, Referrals, Advocacy, and Care Coordination, in order to meet Unmet Mental Health Needs. Patient will implement clinical interventions discussed today to decrease symptoms of depression and increase knowledge and/or ability of: coping skills. Mental Health Concerns and Social Isolation Patient lacks knowledge of available community counseling agencies and resources.  Clinical Goal(s): verbalize understanding of plan for management of Adjustment Disorder, Depression, and Stress and demonstrate a reduction in symptoms. Patient will connect with a provider for ongoing mental health treatment, increase coping skills, healthy habits, self-management skills, and stress reduction        Clinical Interventions:  Assessed patient's previous and current treatment, coping skills, support system and barriers to care. Patient cannot take Zoloft as it previously caused adverse side effects that led to suicidal ideations and almost attempts. Crisis resource information provided to patient.  Verbalization of feelings encouraged, motivational interviewing employed Emotional support provided, positive coping strategies explored. Establishing healthy boundaries emphasized and healthy self-care education provided.  Patient has had conflict with spouse which brought about this new added stress.  Patient was educated on available mental health resources within their area that accept Medicaid and offer counseling and psychiatry. Patient only wants counseling. Patient reports that she recently started on a medicine for depression and one for her anxiety. She reports already seeing improvement in her symptoms.  Patient reports significant worsening anxiety and depression impacting her ability to function appropriately and carry out daily task. Patient is agreeable to referral to [facility] for counseling. [facility] LCSW made referral on 07/08/22. LCSW provided education on relaxation techniques such as meditation, deep breathing, massage, grounding exercises or yoga that can activate the body's relaxation response and ease symptoms of stress and anxiety. LCSW ask that when pt is struggling with difficult emotions and racing thoughts that they start this relaxation response process. LCSW provided extensive education on healthy coping skills for anxiety. SW used active and reflective listening, validated patient's feelings/concerns, and provided emotional support. Patient will work on implementing appropriate self-care habits into their daily routine such as: staying positive, writing a gratitude list, drinking water, staying active around the house, taking their medications as prescribed, combating negative thoughts or emotions and staying connected with their family and friends. Positive reinforcement provided for this  decision to work on this. UPDATE- Patient reports that she has not had the chance to follow up on counseling referral yet but will do so over the next 2 weeks in order to complete intake for [facility]. Reminder email sent to patient as well today. 08/06/22- Patient's phone is now working but she is afraid that she missed a phone call from [place] regarding her counseling referral. Patient has a  new email too. [place] LCSW and patient completed joint call to [facility] and left a message regarding this. [facility] LCSW sent an email to staff inquiring about this as well. Agency reports that they will plan on contacting patient for a second attempt today but will have to close referral after 3 attempts. Patient made aware.   Motivational Interviewing employed Depression screen reviewed  PHQ2/ PHQ9 completed or reviewed  Mindfulness or Relaxation training provided Active listening / Reflection utilized  Advance Care and HCPOA education provided Emotional Support Provided Problem [place] strategies reviewed Provided psychoeducation for mental health needs  Provided brief CBT  Reviewed mental health medications and discussed importance of compliance:  Quality of sleep assessed & Sleep Hygiene techniques promoted  Participation in counseling encouraged  Verbalization of feelings encouraged  Suicidal Ideation/Homicidal Ideation assessed: Patient denies SI/HI  Review resources, discussed options and provided patient information about  [place] care team collaboration (see longitudinal plan of care) 08/13/22 Update- Patient reports that she is unable to talk today due to some concerns with twins but wanted me to know that she heard from [facility] and has her first appointment with them on Monday 08/17/22. Patient Goals/Self-Care Activities: Over the next 120 days Attend scheduled medical appointments Utilize healthy coping skills and supportive resources discussed Contact PCP with any questions or concerns Keep 90 percent of counseling appointments Call your insurance provider for more information about your Enhanced Benefits  Check out counseling resources provided  Begin personal counseling with LCSW, to reduce and manage symptoms of Depression and Stress, until well-established with mental health provider Accept all calls from  representative with [facility] in an effort to establish ongoing mental health counseling and supportive services. Incorporate into daily practice - relaxation techniques, deep breathing exercises, and mindfulness meditation strategies. Talk about feelings with friends, family members, spiritual advisor, etc. Contact LCSW directly [phone]), if you have questions, need assistance, or if additional social work needs are identified between now and our next scheduled telephone outreach call. Call 988 for mental health hotline/crisis line if needed (24/7 available) Try techniques to reduce symptoms of anxiety/negative thinking (deep breathing, distraction, positive self talk, etc)  - develop a personal safety plan - develop a plan to deal with triggers like holidays, anniversaries - exercise at least 2 to 3 times per week - have a plan for how to handle bad days - journal feelings and what helps to feel better or worse - spend time or talk with others at least 2 to 3 times per week - watch for early signs of feeling worse - begin personal counseling - call and visit an old friend - check out volunteer opportunities - join a support group - laugh; watch a funny movie or comedian - learn and use visualization or guided imagery - perform a random act of kindness - practice relaxation or meditation daily - start or continue a personal journal - practice positive thinking and self-talk -continue with compliance of taking medication  -identify current effective and ineffective coping strategies.  -  implement positive self-talk in care to increase self-esteem, confidence and feelings of control.  -consider alternative and complementary therapy approaches such as meditation, mindfulness or yoga.  -journaling, prayer, worship services, meditation or pastoral counseling.  -increase participation in pleasurable group activities such as hobbies, singing, sports or volunteering).  -consider  the use of meditative movement therapy such as tai chi, yoga or qigong.  -start a regular daily exercise program based on tolerance, ability and patient choice to support positive thinking and activity    If you are experiencing a Mental Health or [place] or need someone to talk to, please call the Suicide and Crisis Lifeline: 988    Patient Goals: Follow up goal      Follow up:  Patient agrees to Care Plan and Follow-up.  Plan: The Managed Medicaid care management team will reach out to the patient again over the next 30 days.  Date/time of next scheduled Social Work care management/care coordination outreach:  09/02/22 at [place], [place], MSW, [place] Medicaid LCSW [email or web address] Phone: [phone]

## 2022-09-02 ENCOUNTER — Other Ambulatory Visit: Payer: Medicaid Other | Admitting: Licensed Clinical Social Worker

## 2022-09-02 NOTE — Patient Instructions (Signed)
Visit Information  Ms. Courtois was given information about Medicaid Managed Care team care coordination services as a part of their [place] Medicaid benefit. [place] verbally consented to engagement with the [facility] Managed Care team.   If you are experiencing a medical emergency, please call 911 or report to your local emergency department or urgent care.   If you have a non-emergency medical problem during routine business hours, please contact your provider's office and ask to speak with a nurse.   For questions related to your [facility], please call: [phone] or visit the homepage here: [email or web address]  If you would like to schedule transportation through your [facility], please call the following number at least 2 days in advance of your appointment: [phone]   Rides for urgent appointments can also be made after hours by calling Member Services.  Call the [place] at [phone], at any time, 24 hours a day, 7 days a week. If you are in danger or need immediate medical attention call 911.  If you would like help to quit smoking, call 1-800-QUIT-NOW (410)403-8696) OR Espaol: 1-855-Djelo-Ya (3-295-188-4166) o para ms informacin haga clic aqu or Text READY to 200-400 to register via text   Following is a copy of your plan of care:  Care Plan : LCSW Plan of Care  Updates made by Greg Cutter, LCSW since 09/02/2022 12:00 AM     Problem: Coping Skills (General Plan of Care)      Long-Range Goal: Coping Skills Enhanced   Start Date: 07/08/2022  Recent Progress: On track  Priority: Medium  Note:   Priority: High  Timeframe:  Long-Range Goal Priority:  High Start Date:   07/08/22            Expected End Date:  09/02/22- GOAL MET              Follow Up Date-- 09/02/22 at 230 pm, Goal met as of  today  - keep 90 percent of scheduled appointments -consider counseling or psychiatry -consider bumping up your self-care  -consider creating a stronger support network   Why is this important?             Beating depression may take some time.            If you don't feel better right away, don't give up on your treatment plan.    Current barriers:   Chronic Mental Health needs related to depression, stress and adjustment disorder. Patient requires Support, Education, Resources, Referrals, Advocacy, and Care Coordination, in order to meet Unmet Mental Health Needs. Patient will implement clinical interventions discussed today to decrease symptoms of depression and increase knowledge and/or ability of: coping skills. Mental Health Concerns and Social Isolation Patient lacks knowledge of available community counseling agencies and resources.  Clinical Goal(s): verbalize understanding of plan for management of Adjustment Disorder, Depression, and Stress and demonstrate a reduction in symptoms. Patient will connect with a provider for ongoing mental health treatment, increase coping skills, healthy habits, self-management skills, and stress reduction        Patient Goals/Self-Care Activities: Over the next 120 days Attend scheduled medical appointments Utilize healthy coping skills and supportive resources discussed Contact PCP with any questions or concerns Keep 90 percent of counseling appointments Call your insurance provider for more information about your Enhanced Benefits  Check out counseling resources provided  Begin personal counseling with LCSW, to reduce and  manage symptoms of Depression and Stress, until well-established with mental health provider Accept all calls from representative with [facility] in an effort to establish ongoing mental health counseling and supportive services. Incorporate into daily practice - relaxation techniques, deep breathing exercises, and  mindfulness meditation strategies. Talk about feelings with friends, family members, spiritual advisor, etc. Contact LCSW directly [phone]), if you have questions, need assistance, or if additional social work needs are identified between now and our next scheduled telephone outreach call. Call 988 for mental health hotline/crisis line if needed (24/7 available) Try techniques to reduce symptoms of anxiety/negative thinking (deep breathing, distraction, positive self talk, etc)  - develop a personal safety plan - develop a plan to deal with triggers like holidays, anniversaries - exercise at least 2 to 3 times per week - have a plan for how to handle bad days - journal feelings and what helps to feel better or worse - spend time or talk with others at least 2 to 3 times per week - watch for early signs of feeling worse - begin personal counseling - call and visit an old friend - check out volunteer opportunities - join a support group - laugh; watch a funny movie or comedian - learn and use visualization or guided imagery - perform a random act of kindness - practice relaxation or meditation daily - start or continue a personal journal - practice positive thinking and self-talk -continue with compliance of taking medication  -identify current effective and ineffective coping strategies.  -implement positive self-talk in care to increase self-esteem, confidence and feelings of control.  -consider alternative and complementary therapy approaches such as meditation, mindfulness or yoga.  -journaling, prayer, worship services, meditation or pastoral counseling.  -increase participation in pleasurable group activities such as hobbies, singing, sports or volunteering).  -consider the use of meditative movement therapy such as tai chi, yoga or qigong.  -start a regular daily exercise program based on tolerance, ability and patient choice to support positive thinking and activity    If  you are experiencing a Mental Health or [place] or need someone to talk to, please call the Suicide and Crisis Lifeline: 988    Patient Goals: Discharge goal

## 2022-09-02 NOTE — Patient Outreach (Signed)
Medicaid Managed Care Social Work Note  09/02/2022 Name:  Crystal Hansen MRN:  [id number] DOB:  May 10, 1991  Crystal Hansen is an 31 y.o. year old female who is a primary patient of Bacigalupo, Dionne Bucy, MD.  The Medicaid Managed Care Coordination team was consulted for assistance with:  [place] and Resources  Ms. Hargan was given information about Medicaid Managed Care Coordination team services today. [place] Patient agreed to services and verbal consent obtained.  Engaged with patient  for by telephone for discharge visit  in response to referral for case management and/or care coordination services.   Assessments/Interventions:  Review of past medical history, allergies, medications, health status, including review of consultants reports, laboratory and other test data, was performed as part of comprehensive evaluation and provision of chronic care management services.  SDOH: (Social Determinant of Health) assessments and interventions performed: SDOH Interventions    Flowsheet Row Patient Outreach Telephone from 09/02/2022 in [place] Patient Outreach Telephone from 08/06/2022 in [place] Office Visit from 08/04/2022 in [place] Patient Outreach Telephone from 07/08/2022 in [place] Coordination Office Visit from 06/29/2022 in [place] Visit from 02/20/2019 in [place]  SDOH Interventions        Depression Interventions/Treatment  -- -- Currently on Treatment Medication, Counseling Medication, Counseling Patient refuses Treatment  Stress Interventions Offered [employer], Provide Counseling Provide Counseling, [place], Provide Counseling -- --       Advanced Directives Status:  See Care Plan for related  entries.  Care Plan                 Allergies  Allergen Reactions   Peanut Oil Anaphylaxis   Shellfish Allergy Anaphylaxis   Amoxapine And Related    Apple Juice Other (See Comments)   Amoxicillin Rash    Medications Reviewed Today     Reviewed by Greg Cutter, LCSW (Social Worker) on 09/02/22 at 1526  Med List Status: <None>   Medication Order Taking? Sig Documenting Provider Last Dose Status Informant  cetirizine (ZYRTEC) 10 MG tablet [id number] No Take 1 tablet (10 mg total) by mouth as needed for allergies. [name], FNP Taking Active   escitalopram (LEXAPRO) 10 MG tablet [id number] No Take 1 tablet (10 mg total) by mouth daily. [name], MD Taking Active   etonogestrel (NEXPLANON) 68 MG IMPL implant [id number] No 1 each by Subdermal route once. [provider] Taking Active   hydrOXYzine (VISTARIL) 25 MG capsule [id number] No Take 1 capsule (25 mg total) by mouth every 8 (eight) hours as needed. [name], MD Taking Active   ibuprofen (ADVIL) 800 MG tablet [id number] No Take 1 tablet (800 mg total) by mouth every 8 (eight) hours as needed. [name], MD Taking Active             Patient Active Problem List   Diagnosis Date Noted   Labial lesion 04/01/2022   RUQ pain 01/19/2022   Dysmenorrhea 01/08/2022   Menorrhagia with irregular cycle 01/08/2022   Concentration deficit 09/30/2021   Morbid obesity ([place]) 03/24/2021   SUI (stress urinary incontinence, female) 03/24/2021   Urinary frequency 03/24/2021   Adjustment disorder with mixed anxiety and depressed mood 03/24/2021   Chronic left-sided low back pain without sciatica 09/13/2020   Meralgia paresthetica of left side 09/13/2020   Allergic  rhinitis 02/23/2019   Tobacco use disorder 02/15/2019   Marijuana use 12/10/2012    Conditions to be addressed/monitored per PCP order:  Anxiety and Depression  Care Plan : LCSW Plan of Care  Updates made by Greg Cutter, LCSW  since 09/02/2022 12:00 AM     Problem: Coping Skills (General Plan of Care)      Long-Range Goal: Coping Skills Enhanced   Start Date: 07/08/2022  Recent Progress: On track  Priority: Medium  Note:   Priority: High  Timeframe:  Long-Range Goal Priority:  High Start Date:   07/08/22            Expected End Date:  09/02/22- GOAL MET              Follow Up Date-- 09/02/22 at 230 pm, Goal met as of today  - keep 90 percent of scheduled appointments -consider counseling or psychiatry -consider bumping up your self-care  -consider creating a stronger support network   Why is this important?             Beating depression may take some time.            If you don't feel better right away, don't give up on your treatment plan.    Current barriers:   Chronic Mental Health needs related to depression, stress and adjustment disorder. Patient requires Support, Education, Resources, Referrals, Advocacy, and Care Coordination, in order to meet Unmet Mental Health Needs. Patient will implement clinical interventions discussed today to decrease symptoms of depression and increase knowledge and/or ability of: coping skills. Mental Health Concerns and Social Isolation Patient lacks knowledge of available community counseling agencies and resources.  Clinical Goal(s): verbalize understanding of plan for management of Adjustment Disorder, Depression, and Stress and demonstrate a reduction in symptoms. Patient will connect with a provider for ongoing mental health treatment, increase coping skills, healthy habits, self-management skills, and stress reduction        Clinical Interventions:  Assessed patient's previous and current treatment, coping skills, support system and barriers to care. Patient cannot take Zoloft as it previously caused adverse side effects that led to suicidal ideations and almost attempts. Crisis resource information provided to patient.  Verbalization of feelings encouraged,  motivational interviewing employed Emotional support provided, positive coping strategies explored. Establishing healthy boundaries emphasized and healthy self-care education provided. Patient has had conflict with spouse which brought about this new added stress.  Patient was educated on available mental health resources within their area that accept Medicaid and offer counseling and psychiatry. Patient only wants counseling. Patient reports that she recently started on a medicine for depression and one for her anxiety. She reports already seeing improvement in her symptoms.  Patient reports significant worsening anxiety and depression impacting her ability to function appropriately and carry out daily task. Patient is agreeable to referral to [facility] for [facility]. [facility] LCSW made referral on 07/08/22. LCSW provided education on relaxation techniques such as meditation, deep breathing, massage, grounding exercises or yoga that can activate the body's relaxation response and ease symptoms of stress and anxiety. LCSW ask that when pt is struggling with difficult emotions and racing thoughts that they start this relaxation response process. LCSW provided extensive education on healthy coping skills for anxiety. SW used active and reflective listening, validated patient's feelings/concerns, and provided emotional support. Patient will work on implementing appropriate self-care habits into their daily routine such as: staying positive, writing a gratitude list, drinking water, staying active around the  house, taking their medications as prescribed, combating negative thoughts or emotions and staying connected with their family and friends. Positive reinforcement provided for this decision to work on this. UPDATE- Patient reports that she has not had the chance to follow up on counseling referral yet but will do so over the next 2 weeks in order to complete intake for [facility]. Reminder email  sent to patient as well today. 08/06/22- Patient's phone is now working but she is afraid that she missed a phone call from [place] regarding her counseling referral. Patient has a new email too. [facility] LCSW and patient completed joint call to [facility] and left a message regarding this. [facility] LCSW sent an email to staff inquiring about this as well. Agency reports that they will plan on contacting patient for a second attempt today but will have to close referral after 3 attempts. Patient made aware.   Motivational Interviewing employed Depression screen reviewed  PHQ2/ PHQ9 completed or reviewed  Mindfulness or Relaxation training provided Active listening / Reflection utilized  Advance Care and HCPOA education provided Emotional Support Provided Problem [place] strategies reviewed Provided psychoeducation for mental health needs  Provided brief CBT  Reviewed mental health medications and discussed importance of compliance:  Quality of sleep assessed & Sleep Hygiene techniques promoted  Participation in counseling encouraged  Verbalization of feelings encouraged  Suicidal Ideation/Homicidal Ideation assessed: Patient denies SI/HI  Review resources, discussed options and provided patient information about  [place] care team collaboration (see longitudinal plan of care) 08/13/22 Update- Patient reports that she is unable to talk today due to some concerns with twins but wanted me to know that she heard from [facility] and has her first appointment with them on Monday 08/17/22. 09/02/22 update- Patient reports that she no longer needs [facility] program as her mental health needs have been met. Patient reports that her psychiatric medicine is effectively working for her and she loves her counselor [place] at [employer]. No further needs. Patient is agreeable to case closure at this time. Email sent to patient with [facility] LCSW's contact  information in case future needs arise.  Patient Goals/Self-Care Activities: Over the next 120 days Attend scheduled medical appointments Utilize healthy coping skills and supportive resources discussed Contact PCP with any questions or concerns Keep 90 percent of counseling appointments Call your insurance provider for more information about your Enhanced Benefits  Check out counseling resources provided  Begin personal counseling with LCSW, to reduce and manage symptoms of Depression and Stress, until well-established with mental health provider Accept all calls from representative with [facility] in an effort to establish ongoing mental health counseling and supportive services. Incorporate into daily practice - relaxation techniques, deep breathing exercises, and mindfulness meditation strategies. Talk about feelings with friends, family members, spiritual advisor, etc. Contact LCSW directly [phone]), if you have questions, need assistance, or if additional social work needs are identified between now and our next scheduled telephone outreach call. Call 988 for mental health hotline/crisis line if needed (24/7 available) Try techniques to reduce symptoms of anxiety/negative thinking (deep breathing, distraction, positive self talk, etc)  - develop a personal safety plan - develop a plan to deal with triggers like holidays, anniversaries - exercise at least 2 to 3 times per week - have a plan for how to handle bad days - journal feelings and what helps to feel better or worse - spend time or talk with others at least 2 to  3 times per week - watch for early signs of feeling worse - begin personal counseling - call and visit an old friend - check out volunteer opportunities - join a support group - laugh; watch a funny movie or comedian - learn and use visualization or guided imagery - perform a random act of kindness - practice relaxation or meditation daily - start or  continue a personal journal - practice positive thinking and self-talk -continue with compliance of taking medication  -identify current effective and ineffective coping strategies.  -implement positive self-talk in care to increase self-esteem, confidence and feelings of control.  -consider alternative and complementary therapy approaches such as meditation, mindfulness or yoga.  -journaling, prayer, worship services, meditation or pastoral counseling.  -increase participation in pleasurable group activities such as hobbies, singing, sports or volunteering).  -consider the use of meditative movement therapy such as tai chi, yoga or qigong.  -start a regular daily exercise program based on tolerance, ability and patient choice to support positive thinking and activity    If you are experiencing a Mental Health or [place] or need someone to talk to, please call the Suicide and Crisis Lifeline: 988    Patient Goals: Discharge goal      Follow up:  Patient requests no follow-up at this time.  Plan: The Managed Medicaid care management team is available to follow up with the patient after provider conversation with the patient regarding recommendation for care management engagement and subsequent re-referral to the care management team.   Eula Fried, BSW, MSW, LCSW Managed Medicaid LCSW [email or web address] Phone: [phone]

## 2022-09-08 ENCOUNTER — Encounter: Payer: Self-pay | Admitting: Obstetrics and Gynecology

## 2022-09-08 ENCOUNTER — Ambulatory Visit (INDEPENDENT_AMBULATORY_CARE_PROVIDER_SITE_OTHER): Payer: Medicaid Other | Admitting: Obstetrics and Gynecology

## 2022-09-08 VITALS — BP 118/86 | HR 71 | Ht 65.5 in | Wt 231.9 lb

## 2022-09-08 DIAGNOSIS — Z30431 Encounter for routine checking of intrauterine contraceptive device: Secondary | ICD-10-CM | POA: Diagnosis not present

## 2022-09-08 NOTE — Progress Notes (Signed)
Patient presents today for IUD string check. She states still lightly bleeding . Patient reports no pain or discomfort. Reports she has felt for the strings and that they were felt during intercourse. No other questions or concerns.

## 2022-09-08 NOTE — Progress Notes (Addendum)
HPI:      [name] is a 32 y.o. [phone] who LMP was No LMP recorded.  Subjective:   She presents today for follow-up of IUD.  She had Nexplanon removed and IUD placed.  She reports she had a very heavy menstrual period for 2 weeks but is now only spotting.  She states that she has checked the strings and her partner has felt of 1 time.  She is not concerned about the strings.    Hx: The following portions of the patient's history were reviewed and updated as appropriate:             She  has a past medical history of Allergy, Anxiety, Arthritis, Cesarean wound infection (05/03/2020), and Headache. She does not have any pertinent problems on file. She  has a past surgical history that includes Appendectomy; Cesarean section multi-gestational (04/16/2020); and Wisdom tooth extraction. Her family history includes Anemia in her paternal grandmother; Cancer in her maternal grandmother; Cataracts in her maternal grandfather and mother; Colon cancer in her paternal grandfather; Dementia in her paternal grandmother; Diabetes in her maternal grandfather and mother; Eczema in her father; Heart Problems in her maternal aunt; Heart disease in her maternal grandfather and mother; Kidney disease in her mother; Neuropathy in her mother. She  reports that she has been smoking cigars and cigarettes. She has a 6.50 pack-year smoking history. She has never used smokeless tobacco. She reports current alcohol use of about 3.0 - 4.0 standard drinks of alcohol per week. She reports current drug use. Drug: Marijuana. She has a current medication list which includes the following prescription(s): cetirizine, escitalopram, hydroxyzine, ibuprofen, and levonorgestrel. She is allergic to peanut oil, shellfish allergy, amoxapine and related, apple juice, and amoxicillin.       Review of Systems:  Review of Systems  Constitutional: Denied constitutional symptoms, night sweats, recent illness, fatigue, fever,  insomnia and weight loss.  Eyes: Denied eye symptoms, eye pain, photophobia, vision change and visual disturbance.  Ears/Nose/Throat/Neck: Denied ear, nose, throat or neck symptoms, hearing loss, nasal discharge, sinus congestion and sore throat.  Cardiovascular: Denied cardiovascular symptoms, arrhythmia, chest pain/pressure, edema, exercise intolerance, orthopnea and palpitations.  Respiratory: Denied pulmonary symptoms, asthma, pleuritic pain, productive sputum, cough, dyspnea and wheezing.  Gastrointestinal: Denied, gastro-esophageal reflux, melena, nausea and vomiting.  Genitourinary: Denied genitourinary symptoms including symptomatic vaginal discharge, pelvic relaxation issues, and urinary complaints.  Musculoskeletal: Denied musculoskeletal symptoms, stiffness, swelling, muscle weakness and myalgia.  Dermatologic: Denied dermatology symptoms, rash and scar.  Neurologic: Denied neurology symptoms, dizziness, headache, neck pain and syncope.  Psychiatric: Denied psychiatric symptoms, anxiety and depression.  Endocrine: Denied endocrine symptoms including hot flashes and night sweats.   Meds:   Current Outpatient Medications on File Prior to Visit  Medication Sig Dispense Refill   cetirizine (ZYRTEC) 10 MG tablet Take 1 tablet (10 mg total) by mouth as needed for allergies. 90 tablet 3   escitalopram (LEXAPRO) 10 MG tablet Take 1 tablet (10 mg total) by mouth daily. 90 tablet 1   hydrOXYzine (VISTARIL) 25 MG capsule Take 1 capsule (25 mg total) by mouth every 8 (eight) hours as needed. 90 capsule 1   ibuprofen (ADVIL) 800 MG tablet Take 1 tablet (800 mg total) by mouth every 8 (eight) hours as needed. 270 tablet 0   levonorgestrel (MIRENA) 20 MCG/DAY IUD 1 each by Intrauterine route once.     No current facility-administered medications on file prior to visit.  Objective:     Vitals:   09/08/22 1046  BP: 118/86  Pulse: 71   Filed Weights   09/08/22 1046  Weight: 231 lb  14.4 oz (105.2 kg)              Physical examination   Pelvic:   Vulva: Normal appearance.  No lesions.  Vagina: No lesions or abnormalities noted.  Support: Normal pelvic support.  Urethra No masses tenderness or scarring.  Meatus Normal size without lesions or prolapse.  Cervix: Normal appearance.  No lesions. IUD strings noted at cervical os.  Anus: Normal exam.  No lesions.  Perineum: Normal exam.  No lesions.        Bimanual   Uterus: Normal size.  Non-tender.  Mobile.  AV.  Adnexae: No masses.  Non-tender to palpation.  Cul-de-sac: Negative for abnormality.             Assessment:    G2P2003 Patient Active Problem List   Diagnosis Date Noted   Labial lesion 04/01/2022   RUQ pain 01/19/2022   Dysmenorrhea 01/08/2022   Menorrhagia with irregular cycle 01/08/2022   Concentration deficit 09/30/2021   Morbid obesity ([place]) 03/24/2021   SUI (stress urinary incontinence, female) 03/24/2021   Urinary frequency 03/24/2021   Adjustment disorder with mixed anxiety and depressed mood 03/24/2021   Chronic left-sided low back pain without sciatica 09/13/2020   Meralgia paresthetica of left side 09/13/2020   Allergic rhinitis 02/23/2019   Tobacco use disorder 02/15/2019   Marijuana use 12/10/2012     1. Encounter for routine checking of intrauterine contraceptive device (IUD)     Doing well with IUD-strings appropriate length   Plan:            1.  Patient to contact us if bleeding continues-continue OCPs for 1 month if necessary. Plan follow-up for annual physical in July Orders No orders of the defined types were placed in this encounter.   No orders of the defined types were placed in this encounter.     F/U  Return for Annual Physical. I spent 20 minutes involved in the care of this patient preparing to see the patient by obtaining and reviewing her medical history (including labs, imaging tests and prior procedures), documenting clinical information in the  electronic health record (EHR), counseling and coordinating care plans, writing and sending prescriptions, ordering tests or procedures and in direct communicating with the patient and medical staff discussing pertinent items from her history and physical exam.  Finis Bud, M.D. 09/08/2022 11:13 AM

## 2022-11-16 ENCOUNTER — Telehealth: Payer: Self-pay | Admitting: Family Medicine

## 2022-11-16 ENCOUNTER — Other Ambulatory Visit: Payer: Self-pay

## 2022-11-16 MED ORDER — HYDROXYZINE PAMOATE 25 MG PO CAPS
25.0000 mg | ORAL_CAPSULE | Freq: Three times a day (TID) | ORAL | 1 refills | Status: DC | PRN
Start: 2022-11-16 — End: 2023-06-29

## 2022-11-16 NOTE — Telephone Encounter (Signed)
LOV 08/04/22 NOV 04/05/23 LRF 08/05/22 90 x 1

## 2022-11-16 NOTE — Telephone Encounter (Signed)
[facility] faxed refill request for the following medications:  hydrOXYzine (VISTARIL) 25 MG capsule     Please advise.

## 2023-01-19 ENCOUNTER — Ambulatory Visit: Payer: Medicaid Other | Admitting: Podiatry

## 2023-01-19 DIAGNOSIS — L600 Ingrowing nail: Secondary | ICD-10-CM | POA: Diagnosis not present

## 2023-01-19 MED ORDER — GENTAMICIN SULFATE 0.1 % EX CREA: 1.0000 | TOPICAL_CREAM | Freq: Two times a day (BID) | CUTANEOUS | 1 refills | Status: AC

## 2023-01-19 NOTE — Progress Notes (Signed)
   Chief Complaint  Patient presents with   Ingrown Toenail    Patient came in today for bilateral hallux ingrown bilateral borders, right 2nd 3rd and 4th toes ingrown lateral borders, no drainage,     Subjective: Patient presents today for evaluation of pain to the lateral border of the bilateral great toes. Patient is concerned for possible ingrown nail.  It is very sensitive to touch.  She does have a history of partial nail matricectomy medial and lateral border of the bilateral great toes back in 2022.  Over the past 5 months she has noticed increased pain and tenderness now to the lateral borders of the bilateral great toes.  Patient presents today for further treatment and evaluation.  Past Medical History:  Diagnosis Date   Allergy    Anxiety    Arthritis    Cesarean wound infection 05/03/2020   Headache     Objective:  General: Well developed, nourished, in no acute distress, alert and oriented x3   Dermatology: Skin is warm, dry and supple bilateral.  Lateral border bilateral great toes is tender with evidence of an ingrowing nail. Pain on palpation noted to the border of the nail fold. The remaining nails appear unremarkable at this time. There are no open sores, lesions.  Vascular: DP and PT pulses palpable.  No clinical evidence of vascular compromise  Neruologic: Grossly intact via light touch bilateral.  Musculoskeletal: No pedal deformity noted  Assesement: #1 Paronychia with ingrowing nail lateral to bilateral great toes  Plan of Care:  1. Patient evaluated.  2. Discussed treatment alternatives and plan of care. Explained nail avulsion procedure and post procedure course to patient. 3. Patient opted for permanent partial nail avulsion of the ingrown portion of the nail.  4. Prior to procedure, local anesthesia infiltration utilized using 3 ml of a 50:50 mixture of 2% plain lidocaine and 0.5% plain marcaine in a normal hallux block fashion and a betadine prep  performed.  5. Partial permanent nail avulsion with chemical matrixectomy performed using 3x30sec applications of phenol followed by alcohol flush.  6. Light dressing applied.  Post care instructions provided 7.  Prescription for gentamicin 2% cream  8.  Return to clinic 3 weeks  *Night shift [employer] at [facility]  Felecia Shelling, DPM [facility]  Dr. Felecia Shelling, DPM    [address] [place], [place] [address]                Office [phone]  Fax [phone]

## 2023-02-09 ENCOUNTER — Ambulatory Visit: Payer: Medicaid Other | Admitting: Podiatry

## 2023-04-05 ENCOUNTER — Encounter: Payer: Self-pay | Admitting: Family Medicine

## 2023-04-05 ENCOUNTER — Other Ambulatory Visit (HOSPITAL_COMMUNITY)
Admission: RE | Admit: 2023-04-05 | Discharge: 2023-04-05 | Disposition: A | Discharge status: Home / Self Care | Payer: Medicaid Other | Source: Ambulatory Visit | Attending: Family Medicine | Admitting: Family Medicine

## 2023-04-05 ENCOUNTER — Ambulatory Visit (INDEPENDENT_AMBULATORY_CARE_PROVIDER_SITE_OTHER): Payer: Medicaid Other | Admitting: Family Medicine

## 2023-04-05 VITALS — BP 107/50 | HR 72 | Temp 98.2°F | Resp 12 | Ht 65.5 in | Wt 207.1 lb

## 2023-04-05 DIAGNOSIS — E669 Obesity, unspecified: Secondary | ICD-10-CM

## 2023-04-05 DIAGNOSIS — Z124 Encounter for screening for malignant neoplasm of cervix: Secondary | ICD-10-CM | POA: Diagnosis present

## 2023-04-05 DIAGNOSIS — F4323 Adjustment disorder with mixed anxiety and depressed mood: Secondary | ICD-10-CM | POA: Diagnosis not present

## 2023-04-05 DIAGNOSIS — Z0000 Encounter for general adult medical examination without abnormal findings: Secondary | ICD-10-CM | POA: Diagnosis not present

## 2023-04-05 DIAGNOSIS — F17200 Nicotine dependence, unspecified, uncomplicated: Secondary | ICD-10-CM

## 2023-04-05 MED ORDER — IBUPROFEN 800 MG PO TABS
800.0000 mg | ORAL_TABLET | Freq: Three times a day (TID) | ORAL | 1 refills | Status: AC | PRN
Start: 2023-04-05 — End: ?

## 2023-04-05 NOTE — Progress Notes (Signed)
Complete physical exam  Patient: Crystal Hansen   DOB: October 30, 1990   32 y.o. Female  MRN: [id number]  Subjective:    Chief Complaint  Patient presents with   Annual Exam    Crystal Hansen is a 32 y.o. female who presents today for a complete physical exam. She reports consuming a general diet.  She generally feels well. She reports sleeping fairly well. She does not have additional problems to discuss today.   Discussed the use of AI scribe software for clinical note transcription with the patient, who gave verbal consent to proceed.  History of Present Illness   The patient, a 32 year old with a history of tobacco use disorder, obesity, and adjustment disorder with anxiety and depressed mood, presents for her annual physical. She is currently on Lexapro 10mg  daily and Hydroxyzine 25mg  every eight hours as needed. The patient works the night shift, which has led to irregular sleep patterns. She is up to date on vaccines and is aware of the availability of the flu shot and COVID booster in the fall. Her last Pap smear was three years ago, which is in line with the recommended frequency for individuals under 30.        Most recent fall risk assessment:    04/05/2023   10:02 AM  Fall Risk   Falls in the past year? 0  Number falls in past yr: 0  Injury with Fall? 0  Risk for fall due to : No Fall Risks  Follow up Falls evaluation completed     Most recent depression screenings:    04/05/2023   10:02 AM 08/04/2022   10:52 AM  PHQ 2/9 Scores  PHQ - 2 Score 1 0  PHQ- 9 Score  4        Patient Care Team: Erasmo Downer, MD as PCP - General (Family Medicine)   Outpatient Medications Prior to Visit  Medication Sig   cetirizine (ZYRTEC) 10 MG tablet Take 1 tablet (10 mg total) by mouth as needed for allergies.   escitalopram (LEXAPRO) 10 MG tablet Take 1 tablet (10 mg total) by mouth daily.   gentamicin cream (GARAMYCIN) 0.1 % Apply 1 Application topically 2  (two) times daily.   hydrOXYzine (VISTARIL) 25 MG capsule Take 1 capsule (25 mg total) by mouth every 8 (eight) hours as needed.   levonorgestrel (MIRENA) 20 MCG/DAY IUD 1 each by Intrauterine route once.   [DISCONTINUED] ibuprofen (ADVIL) 800 MG tablet Take 1 tablet (800 mg total) by mouth every 8 (eight) hours as needed.   No facility-administered medications prior to visit.    ROS per HPI        Objective:     BP (!) 107/50 (BP Location: Left Arm, Patient Position: Sitting, Cuff Size: Large)   Pulse 72   Temp 98.2 F (36.8 C) (Temporal)   Resp 12   Ht 5' 5.5" (1.664 m)   Wt 207 lb 1.6 oz (93.9 kg)   LMP 04/01/2023 (Approximate)   SpO2 99%   BMI 33.94 kg/m    Physical Exam Vitals reviewed.  Constitutional:      General: She is not in acute distress.    Appearance: Normal appearance. She is well-developed. She is not diaphoretic.  HENT:     Head: Normocephalic and atraumatic.     Right Ear: Tympanic membrane, ear canal and external ear normal.     Left Ear: Tympanic membrane, ear canal and external ear normal.  Nose: Nose normal.     Mouth/Throat:     Mouth: Mucous membranes are moist.     Pharynx: Oropharynx is clear. No oropharyngeal exudate.  Eyes:     General: No scleral icterus.    Conjunctiva/sclera: Conjunctivae normal.     Pupils: Pupils are equal, round, and reactive to light.  Neck:     Thyroid: No thyromegaly.  Cardiovascular:     Rate and Rhythm: Normal rate and regular rhythm.     Heart sounds: Normal heart sounds. No murmur heard. Pulmonary:     Effort: Pulmonary effort is normal. No respiratory distress.     Breath sounds: Normal breath sounds. No wheezing or rales.  Abdominal:     General: There is no distension.     Palpations: Abdomen is soft.     Tenderness: There is no abdominal tenderness.  Genitourinary:    Comments: GYN:  External genitalia within normal limits.  Vaginal mucosa pink, moist, normal rugae.  Nonfriable cervix without  lesions, no discharge or bleeding noted on speculum exam.   Musculoskeletal:        General: No deformity.     Cervical back: Neck supple.     Right lower leg: No edema.     Left lower leg: No edema.  Lymphadenopathy:     Cervical: No cervical adenopathy.  Skin:    General: Skin is warm and dry.     Findings: No rash.  Neurological:     Mental Status: She is alert and oriented to person, place, and time. Mental status is at baseline.     Gait: Gait normal.  Psychiatric:        Mood and Affect: Mood normal.        Behavior: Behavior normal.        Thought Content: Thought content normal.      No results found for any visits on 04/05/23.     Assessment & Plan:    Routine Health Maintenance and Physical Exam  Immunization History  Administered Date(s) Administered   Tdap 08/07/2012, 02/20/2020    Health Maintenance  Topic Date Due   COVID-19 Vaccine (1 - 2023-24 season) Never done   PAP SMEAR-Modifier  10/23/2022   INFLUENZA VACCINE  04/08/2023   DTaP/Tdap/Td (3 - Td or Tdap) 02/19/2030   Hepatitis C Screening  Completed   HIV Screening  Completed   HPV VACCINES  Aged Out    Discussed health benefits of physical activity, and encouraged her to engage in regular exercise appropriate for her age and condition.  Problem List Items Addressed This Visit       Other   Tobacco use disorder   Obesity (BMI 30-39.9)    Discussed importance of healthy weight management Discussed diet and exercise       Adjustment disorder with mixed anxiety and depressed mood   Other Visit Diagnoses     Encounter for annual physical exam    -  Primary   Relevant Orders   Comprehensive metabolic panel   Lipid panel   CBC w/Diff/Platelet   Cervical cancer screening       Relevant Orders   Cytology - PAP           Adjustment disorder with anxiety and depressed mood: Stable on Lexapro 10mg  daily and Hydroxyzine 25mg  every 8 hours as needed. -Continue current medication  regimen.  Tobacco use disorder: Not ready to quit - counseled.  Obesity: congratulated on weight loss.  General Health Maintenance /  Followup Plans -Up to date on vaccines. Flu shot and COVID booster will be available in the fall. -Pap smear due (last was 3 years ago).        Return in about 1 year (around 04/04/2024) for CPE.     Shirlee Latch, MD

## 2023-04-05 NOTE — Assessment & Plan Note (Signed)
Discussed importance of healthy weight management Discussed diet and exercise  

## 2023-04-26 ENCOUNTER — Telehealth: Payer: Self-pay | Admitting: Family Medicine

## 2023-04-26 MED ORDER — ESCITALOPRAM OXALATE 10 MG PO TABS: 10.0000 mg | ORAL_TABLET | Freq: Every day | ORAL | 1 refills | Status: DC

## 2023-04-26 NOTE — Telephone Encounter (Signed)
[facility] faxed refill request for the following medications:  escitalopram (LEXAPRO) 10 MG tablet   Please advise.  

## 2023-06-28 ENCOUNTER — Other Ambulatory Visit: Payer: Self-pay | Admitting: Family Medicine

## 2023-06-29 ENCOUNTER — Telehealth: Payer: Self-pay | Admitting: Family Medicine

## 2023-06-29 MED ORDER — HYDROXYZINE PAMOATE 25 MG PO CAPS: 25.0000 mg | ORAL_CAPSULE | Freq: Three times a day (TID) | ORAL | 1 refills | Status: DC | PRN

## 2023-06-29 NOTE — Telephone Encounter (Signed)
Refilled

## 2023-06-29 NOTE — Telephone Encounter (Signed)
[facility] pharmacy is requesting prescription hydrOXYzine (VISTARIL) 25 MG capsule  Please advise

## 2023-07-07 ENCOUNTER — Ambulatory Visit (INDEPENDENT_AMBULATORY_CARE_PROVIDER_SITE_OTHER): Payer: Medicaid Other | Admitting: Obstetrics and Gynecology

## 2023-07-07 ENCOUNTER — Encounter: Payer: Self-pay | Admitting: Obstetrics and Gynecology

## 2023-07-07 VITALS — BP 119/74 | HR 69 | Ht 65.5 in | Wt 197.3 lb

## 2023-07-07 DIAGNOSIS — Z01419 Encounter for gynecological examination (general) (routine) without abnormal findings: Secondary | ICD-10-CM

## 2023-07-07 NOTE — Progress Notes (Signed)
HPI:      [name] is a 32 y.o. [phone] who LMP was Patient's last menstrual period was 06/12/2023 (approximate).  Subjective:   She presents today for her annual examination.  She states that she still has monthly menses with her Mirena IUD.  She says generally they are not heavy but she still has cramping.  She would like to keep her IUD for now.    Hx: The following portions of the patient's history were reviewed and updated as appropriate:             She  has a past medical history of Allergy, Anxiety, Arthritis, Cesarean wound infection (05/03/2020), and Headache. She does not have any pertinent problems on file. She  has a past surgical history that includes Appendectomy; Cesarean section multi-gestational (04/16/2020); and Wisdom tooth extraction. Her family history includes Anemia in her paternal grandmother; Cancer in her maternal grandmother; Cataracts in her maternal grandfather and mother; Colon cancer in her paternal grandfather; Dementia in her paternal grandmother; Diabetes in her maternal grandfather and mother; Eczema in her father; Heart Problems in her maternal aunt; Heart disease in her maternal grandfather and mother; Kidney disease in her mother; Neuropathy in her mother. She  reports that she has been smoking cigars and cigarettes. She started smoking about 16 years ago. She has a 6.5 pack-year smoking history. She has never used smokeless tobacco. She reports current alcohol use of about 3.0 - 4.0 standard drinks of alcohol per week. She reports current drug use. Drug: Marijuana. She has a current medication list which includes the following prescription(s): cetirizine, escitalopram, gentamicin cream, hydroxyzine, ibuprofen, and levonorgestrel. She is allergic to peanut oil, shellfish allergy, amoxapine and related, apple juice, and amoxicillin.       Review of Systems:  Review of Systems  Constitutional: Denied constitutional symptoms, night sweats, recent  illness, fatigue, fever, insomnia and weight loss.  Eyes: Denied eye symptoms, eye pain, photophobia, vision change and visual disturbance.  Ears/Nose/Throat/Neck: Denied ear, nose, throat or neck symptoms, hearing loss, nasal discharge, sinus congestion and sore throat.  Cardiovascular: Denied cardiovascular symptoms, arrhythmia, chest pain/pressure, edema, exercise intolerance, orthopnea and palpitations.  Respiratory: Denied pulmonary symptoms, asthma, pleuritic pain, productive sputum, cough, dyspnea and wheezing.  Gastrointestinal: Denied, gastro-esophageal reflux, melena, nausea and vomiting.  Genitourinary: Denied genitourinary symptoms including symptomatic vaginal discharge, pelvic relaxation issues, and urinary complaints.  Musculoskeletal: Denied musculoskeletal symptoms, stiffness, swelling, muscle weakness and myalgia.  Dermatologic: Denied dermatology symptoms, rash and scar.  Neurologic: Denied neurology symptoms, dizziness, headache, neck pain and syncope.  Psychiatric: Denied psychiatric symptoms, anxiety and depression.  Endocrine: Denied endocrine symptoms including hot flashes and night sweats.   Meds:   Current Outpatient Medications on File Prior to Visit  Medication Sig Dispense Refill   cetirizine (ZYRTEC) 10 MG tablet TAKE 1 TABLET BY MOUTH AS NEEDED EVERY DAY 90 tablet 3   escitalopram (LEXAPRO) 10 MG tablet Take 1 tablet (10 mg total) by mouth daily. 90 tablet 1   gentamicin cream (GARAMYCIN) 0.1 % Apply 1 Application topically 2 (two) times daily. 30 g 1   hydrOXYzine (VISTARIL) 25 MG capsule Take 1 capsule (25 mg total) by mouth every 8 (eight) hours as needed. 90 capsule 1   ibuprofen (ADVIL) 800 MG tablet Take 1 tablet (800 mg total) by mouth every 8 (eight) hours as needed. 270 tablet 1   levonorgestrel (MIRENA) 20 MCG/DAY IUD 1 each by Intrauterine route once.     No  current facility-administered medications on file prior to visit.     Objective:      Vitals:   07/07/23 1330  BP: 119/74  Pulse: 69    Filed Weights   07/07/23 1330  Weight: 197 lb 4.8 oz (89.5 kg)              Physical examination General NAD, Conversant  HEENT Atraumatic; Op clear with mmm.  Normo-cephalic.  Anicteric sclerae  Thyroid/Neck Smooth without nodularity or enlargement. Normal ROM.  Neck Supple.  Skin No rashes, lesions or ulceration. Normal palpated skin turgor. No nodularity.  Breasts: No masses or discharge.  Symmetric.  No axillary adenopathy.  Lungs: Clear to auscultation.No rales or wheezes. Normal Respiratory effort, no retractions.  Heart: NSR.  No murmurs or rubs appreciated. No peripheral edema  Abdomen: Soft.  Non-tender.  No masses.  No HSM. No hernia  Extremities: Moves all appropriately.  Normal ROM for age. No lymphadenopathy.  Neuro: Oriented to PPT.  Normal mood. Normal affect.     Pelvic: Patient deferred    Assessment:    G2P2003 Patient Active Problem List   Diagnosis Date Noted   Labial lesion 04/01/2022   RUQ pain 01/19/2022   Dysmenorrhea 01/08/2022   Menorrhagia with irregular cycle 01/08/2022   Concentration deficit 09/30/2021   Obesity (BMI 30-39.9) 03/24/2021   SUI (stress urinary incontinence, female) 03/24/2021   Urinary frequency 03/24/2021   Adjustment disorder with mixed anxiety and depressed mood 03/24/2021   Chronic left-sided low back pain without sciatica 09/13/2020   Meralgia paresthetica of left side 09/13/2020   Allergic rhinitis 02/23/2019   Tobacco use disorder 02/15/2019   Marijuana use 12/10/2012     1. Well woman exam with routine gynecological exam     Patient continues to have monthly menses with IUD some having some not.  She has cramping each month.  At this time she does not want to do anything about her IUD. (Offered ultrasound to make sure it was positioned correctly -she has declined this option)   Plan:            1.  Basic Screening Recommendations The basic screening  recommendations for asymptomatic women were discussed with the patient during her visit.  The age-appropriate recommendations were discussed with her and the rational for the tests reviewed.  When I am informed by the patient that another primary care physician has previously obtained the age-appropriate tests and they are up-to-date, only outstanding tests are ordered and referrals given as necessary.  Abnormal results of tests will be discussed with her when all of her results are completed.  Routine preventative health maintenance measures emphasized: Exercise/Diet/Weight control, Tobacco Warnings, Alcohol/Substance use risks and Stress Management 2.  Have informed her that if she changes her mind she may contact us and we will consider ultrasound for IUD location. Orders No orders of the defined types were placed in this encounter.   No orders of the defined types were placed in this encounter.           F/U  Return in about 1 year (around 07/06/2024) for Annual Physical.  Elonda Husky, M.D. 07/07/2023 1:49 PM

## 2023-07-07 NOTE — Progress Notes (Signed)
Patients presents for annual exam today. She states continuing to have monthly cycles with her IUD but would like to continue using this method.Up to date on pap smear. She states no other questions or concerns at this time.

## 2023-10-25 ENCOUNTER — Other Ambulatory Visit: Payer: Self-pay | Admitting: Family Medicine

## 2023-10-25 NOTE — Telephone Encounter (Signed)
Medication Refill -  Most Recent Primary Care Visit:  Provider: Erasmo Downer  Department: [id number]  Visit Type: PHYSICAL  Date: 04/05/2023  Medication: hydrOXYzine (VISTARIL) 25 MG capsule /escitalopram (LEXAPRO) 10 MG tablet  Has the patient contacted their pharmacy? yes (Agent: If yes, when and what did the pharmacy advise?)contact pcp  Is this the correct pharmacy for this prescription? yes  This is the patient's preferred pharmacy:  [id number] - [place], [place] - [address] AT [facility] [address] [facility] [address] [place] [address] Phone: [phone] Fax: [phone]   Has the prescription been filled recently? no  Is the patient out of the medication? yes  Has the patient been seen for an appointment in the last year OR does the patient have an upcoming appointment? yes  Can we respond through MyChart? yes  Agent: Please be advised that Rx refills may take up to 3 business days. We ask that you follow-up with your pharmacy.

## 2023-10-26 NOTE — Telephone Encounter (Signed)
Requested medication (s) are due for refill today: Yes  Requested medication (s) are on the active medication list: Yes  Last refill:  04/26/23  Future visit scheduled: Yes  Notes to clinic:  See request.    Requested Prescriptions  Pending Prescriptions Disp Refills   escitalopram (LEXAPRO) 10 MG tablet 90 tablet     Sig: Take 1 tablet (10 mg total) by mouth daily.     Psychiatry:  Antidepressants - SSRI Failed - 10/26/2023 12:34 PM      Failed - Valid encounter within last 6 months    Recent Outpatient Visits           6 months ago Encounter for annual physical exam   [place] [facility] [place], Marzella Schlein, MD   1 year ago Adjustment disorder with mixed anxiety and depressed mood   [place] [facility] [place], Marzella Schlein, MD   1 year ago Adjustment disorder with mixed anxiety and depressed mood   [place] [facility] Caro Laroche, DO   1 year ago Routine screening for STI (sexually transmitted infection)   [place] [facility] Caro Laroche, DO   1 year ago Community acquired pneumonia, unspecified laterality   [place] [facility] [place], Marzella Schlein, MD       Future Appointments             In 5 months Bacigalupo, Marzella Schlein, MD [facility]            Refused Prescriptions Disp Refills   hydrOXYzine (VISTARIL) 25 MG capsule 90 capsule     Sig: Take 1 capsule (25 mg total) by mouth every 8 (eight) hours as needed.     Ear, Nose, and Throat:  Antihistamines 2 Passed - 10/26/2023 12:34 PM      Passed - Cr in normal range and within 360 days    Creatinine, Ser  Date Value Ref Range Status  04/05/2023 0.71 0.57 - 1.00 mg/dL Final         Passed - Valid encounter within last 12 months    Recent Outpatient Visits           6 months ago Encounter for annual physical exam   [place] [facility] [place],  Marzella Schlein, MD   1 year ago Adjustment disorder with mixed anxiety and depressed mood   [place] [facility] [place], Marzella Schlein, MD   1 year ago Adjustment disorder with mixed anxiety and depressed mood   [place] [facility] Caro Laroche, DO   1 year ago Routine screening for STI (sexually transmitted infection)   [facility] Caro Laroche, DO   1 year ago Community acquired pneumonia, unspecified laterality   [place] [facility] [place], Marzella Schlein, MD       Future Appointments             In 5 months Bacigalupo, Marzella Schlein, MD [facility]            Yes

## 2023-10-26 NOTE — Telephone Encounter (Signed)
Needs appointment for Lexapro- has one in July but will be >3 month overdue.

## 2023-10-26 NOTE — Telephone Encounter (Signed)
Requested Prescriptions  Pending Prescriptions Disp Refills   hydrOXYzine (VISTARIL) 25 MG capsule 90 capsule     Sig: Take 1 capsule (25 mg total) by mouth every 8 (eight) hours as needed.     Ear, Nose, and Throat:  Antihistamines 2 Passed - 10/26/2023 11:58 AM      Passed - Cr in normal range and within 360 days    Creatinine, Ser  Date Value Ref Range Status  04/05/2023 0.71 0.57 - 1.00 mg/dL Final         Passed - Valid encounter within last 12 months    Recent Outpatient Visits           6 months ago Encounter for annual physical exam   [place] [facility] [place], Marzella Schlein, MD   1 year ago Adjustment disorder with mixed anxiety and depressed mood   [place] [facility] [place], Marzella Schlein, MD   1 year ago Adjustment disorder with mixed anxiety and depressed mood   [place] [facility] Caro Laroche, DO   1 year ago Routine screening for STI (sexually transmitted infection)   [place] [facility] Caro Laroche, DO   1 year ago Community acquired pneumonia, unspecified laterality   [place] [facility] [place], Marzella Schlein, MD       Future Appointments             In 5 months Bacigalupo, Marzella Schlein, MD [facility]             escitalopram (LEXAPRO) 10 MG tablet 90 tablet     Sig: Take 1 tablet (10 mg total) by mouth daily.     Psychiatry:  Antidepressants - SSRI Failed - 10/26/2023 11:58 AM      Failed - Valid encounter within last 6 months    Recent Outpatient Visits           6 months ago Encounter for annual physical exam   [place] [facility] [place], Marzella Schlein, MD   1 year ago Adjustment disorder with mixed anxiety and depressed mood   [place] [facility] [place], Marzella Schlein, MD   1 year ago Adjustment disorder with mixed anxiety and depressed mood   [place] Caro Laroche, DO   1 year ago Routine screening for STI (sexually transmitted infection)   [facility] Caro Laroche, DO   1 year ago Community acquired pneumonia, unspecified laterality    [facility] [place], Marzella Schlein, MD       Future Appointments             In 5 months Bacigalupo, Marzella Schlein, MD [facility]

## 2023-10-27 MED ORDER — ESCITALOPRAM OXALATE 10 MG PO TABS: 10.0000 mg | ORAL_TABLET | Freq: Every day | ORAL | 0 refills | Status: DC

## 2023-12-23 ENCOUNTER — Other Ambulatory Visit: Payer: Self-pay | Admitting: Family Medicine

## 2023-12-24 MED ORDER — HYDROXYZINE PAMOATE 25 MG PO CAPS: 25.0000 mg | ORAL_CAPSULE | Freq: Three times a day (TID) | ORAL | 0 refills | Status: DC | PRN

## 2023-12-24 NOTE — Telephone Encounter (Signed)
 OV 04/05/23 Requested Prescriptions  Pending Prescriptions Disp Refills   hydrOXYzine  (VISTARIL ) 25 MG capsule 90 capsule 1    Sig: Take 1 capsule (25 mg total) by mouth every 8 (eight) hours as needed.     Ear, Nose, and Throat:  Antihistamines 2 Failed - 12/24/2023 11:17 AM      Failed - Valid encounter within last 12 months    Recent Outpatient Visits   None     Future Appointments             In 3 months Bacigalupo, Jon HERO, MD [facility]            Passed - Cr in normal range and within 360 days    Creatinine, Ser  Date Value Ref Range Status  04/05/2023 0.71 0.57 - 1.00 mg/dL Final

## 2024-01-11 ENCOUNTER — Other Ambulatory Visit: Payer: Self-pay | Admitting: Family Medicine

## 2024-01-17 ENCOUNTER — Other Ambulatory Visit: Payer: Self-pay | Admitting: Family Medicine

## 2024-01-17 NOTE — Telephone Encounter (Signed)
 Copied from CRM [id number]. Topic: Clinical - Medication Refill >> Jan 17, 2024 11:09 AM Lizabeth Riggs wrote: Medication: escitalopram  (LEXAPRO ) 10 MG tablet  Has the patient contacted their pharmacy? Yes (Agent: If no, request that the patient contact the pharmacy for the refill. If patient does not wish to contact the pharmacy document the reason why and proceed with request.) (Agent: If yes, when and what did the pharmacy advise?) Pharmacy needs order to refill  This is the patient's preferred pharmacy:  [id number] [place], [place]- - [address] AT [facility] [address] [place] [address] [place] [address] Phone: [phone] Fax: [phone]  Is this the correct pharmacy for this prescription? Yes If no, delete pharmacy and type the correct one.   Has the prescription been filled recently? No  Is the patient out of the medication? No - She has 2 pills left. She called pharmacy last week to refill and they are waiting on her doctor to send order  Has the patient been seen for an appointment in the last year OR does the patient have an upcoming appointment? Yes  Can we respond through MyChart? No  Agent: Please be advised that Rx refills may take up to 3 business days. We ask that you follow-up with your pharmacy.

## 2024-01-19 MED ORDER — ESCITALOPRAM OXALATE 10 MG PO TABS: 10.0000 mg | ORAL_TABLET | Freq: Every day | ORAL | 0 refills | Status: DC

## 2024-01-19 NOTE — Telephone Encounter (Signed)
 Requested medication (s) are due for refill today: yes  Requested medication (s) are on the active medication list: yes  Last refill:  10/27/23  Future visit scheduled: yes  Notes to clinic:  Unable to refill per protocol, courtesy refill already given, routing for provider approval. OV scheduled 04/06/24, routing for approval.     Requested Prescriptions  Pending Prescriptions Disp Refills   escitalopram  (LEXAPRO ) 10 MG tablet 60 tablet 0    Sig: Take 1 tablet (10 mg total) by mouth daily.     Psychiatry:  Antidepressants - SSRI Failed - 01/19/2024  9:46 AM      Failed - Valid encounter within last 6 months    Recent Outpatient Visits   None     Future Appointments             In 2 months Bacigalupo, Stan Eans, MD [facility]

## 2024-03-16 ENCOUNTER — Other Ambulatory Visit: Payer: Self-pay | Admitting: Family Medicine

## 2024-03-16 NOTE — Telephone Encounter (Signed)
 Copied from CRM [id number]. Topic: Clinical - Medication Refill >> Mar 16, 2024  8:33 AM Charlet HERO wrote: Medication: hydrOXYzine  (VISTARIL ) 25 MG capsule escitalopram  (LEXAPRO ) 10 MG tablet  Has the patient contacted their pharmacy? Yes No refills left  This is the patient's preferred pharmacy:  [id number] [place], [place] - [address] AT [facility] [address] [place] [address] [place] [address] Phone: [phone] Fax: [phone]  Is this the correct pharmacy for this prescription? Yes If no, delete pharmacy and type the correct one.   Has the prescription been filled recently? Yes  Is the patient out of the medication? Yes  Has the patient been seen for an appointment in the last year OR does the patient have an upcoming appointment? Yes  Can we respond through MyChart? Yes  Agent: Please be advised that Rx refills may take up to 3 business days. We ask that you follow-up with your pharmacy.

## 2024-03-17 ENCOUNTER — Other Ambulatory Visit: Payer: Self-pay | Admitting: Family Medicine

## 2024-03-17 MED ORDER — HYDROXYZINE PAMOATE 25 MG PO CAPS: 25.0000 mg | ORAL_CAPSULE | Freq: Three times a day (TID) | ORAL | 0 refills | Status: DC | PRN

## 2024-03-17 NOTE — Telephone Encounter (Signed)
 Requested medication (s) are due for refill today: yes  Requested medication (s) are on the active medication list: yes  Last refill:   escitalopram : 01/19/24 #60 Hydroxyzine : 12/24/23 #90  Future visit scheduled: yes  Notes to clinic:  noted need in office visit for refills   Requested Prescriptions  Pending Prescriptions Disp Refills   escitalopram  (LEXAPRO ) 10 MG tablet 60 tablet 0    Sig: Take 1 tablet (10 mg total) by mouth daily.     Psychiatry:  Antidepressants - SSRI Failed - 03/17/2024  3:16 PM      Failed - Valid encounter within last 6 months    Recent Outpatient Visits   None     Future Appointments             In 2 weeks Bacigalupo, Jon HERO, MD [place] [facility]             hydrOXYzine  (VISTARIL ) 25 MG capsule 90 capsule 0    Sig: Take 1 capsule (25 mg total) by mouth every 8 (eight) hours as needed.     Ear, Nose, and Throat:  Antihistamines 2 Failed - 03/17/2024  3:16 PM      Failed - Valid encounter within last 12 months    Recent Outpatient Visits   None     Future Appointments             In 2 weeks Bacigalupo, Jon HERO, MD [facility]            Passed - Cr in normal range and within 360 days    Creatinine, Ser  Date Value Ref Range Status  04/05/2023 0.71 0.57 - 1.00 mg/dL Final

## 2024-03-17 NOTE — Telephone Encounter (Signed)
 Copied from CRM [id number]. Topic: Clinical - Medication Refill >> Mar 16, 2024  8:33 AM Charlet HERO wrote: Medication: hydrOXYzine  (VISTARIL ) 25 MG capsule escitalopram  (LEXAPRO ) 10 MG tablet  Has the patient contacted their pharmacy? Yes No refills left  This is the patient's preferred pharmacy:  [id number] [place], [place] - [address] AT [facility] OF [address] [place] [address] [place] [address] Phone: [phone] Fax: [phone]  Is this the correct pharmacy for this prescription? Yes If no, delete pharmacy and type the correct one.   Has the prescription been filled recently? Yes  Is the patient out of the medication? Yes  Has the patient been seen for an appointment in the last year OR does the patient have an upcoming appointment? Yes  Can we respond through MyChart? Yes  Agent: Please be advised that Rx refills may take up to 3 business days. We ask that you follow-up with your pharmacy.

## 2024-03-17 NOTE — Telephone Encounter (Signed)
 Requested Prescriptions  Pending Prescriptions Disp Refills   hydrOXYzine  (VISTARIL ) 25 MG capsule 90 capsule 0    Sig: Take 1 capsule (25 mg total) by mouth every 8 (eight) hours as needed.     Ear, Nose, and Throat:  Antihistamines 2 Failed - 03/17/2024  3:15 PM      Failed - Valid encounter within last 12 months    Recent Outpatient Visits   None     Future Appointments             In 2 weeks Bacigalupo, Jon HERO, MD [facility]            Passed - Cr in normal range and within 360 days    Creatinine, Ser  Date Value Ref Range Status  04/05/2023 0.71 0.57 - 1.00 mg/dL Final

## 2024-04-06 ENCOUNTER — Encounter: Payer: Self-pay | Admitting: Family Medicine

## 2024-04-06 NOTE — Progress Notes (Deleted)
 Complete physical exam   Patient: Crystal Hansen   DOB: 1991/07/26   33 y.o. Female  MRN: [id number] Visit Date: 04/06/2024  Today's healthcare provider: Jon Eva, MD   No chief complaint on file.  Subjective    Crystal Hansen is a 33 y.o. female who presents today for a complete physical exam.  She reports consuming a {diet types:17450} diet. {Exercise:19826} She generally feels {well/fairly well/poorly:18703}. She reports sleeping {well/fairly well/poorly:18703}. She {does/does not:200015} have additional problems to discuss today.    Discussed the use of AI scribe software for clinical note transcription with the patient, who gave verbal consent to proceed.  History of Present Illness            Last depression screening scores    04/05/2023   10:02 AM 08/04/2022   10:52 AM 07/08/2022   11:02 AM  PHQ 2/9 Scores  PHQ - 2 Score 1 0 4  PHQ- 9 Score  4 14   Last fall risk screening    04/05/2023   10:02 AM  Fall Risk   Falls in the past year? 0  Number falls in past yr: 0  Injury with Fall? 0  Risk for fall due to : No Fall Risks  Follow up Falls evaluation completed    {VISON DENTAL STD PSA (Optional):27386}  {History (Optional):23778}  Medications: Outpatient Medications Prior to Visit  Medication Sig   cetirizine  (ZYRTEC ) 10 MG tablet TAKE 1 TABLET BY MOUTH AS NEEDED EVERY DAY   escitalopram  (LEXAPRO ) 10 MG tablet Take 1 tablet (10 mg total) by mouth daily.   gentamicin  cream (GARAMYCIN ) 0.1 % Apply 1 Application topically 2 (two) times daily.   hydrOXYzine  (VISTARIL ) 25 MG capsule Take 1 capsule (25 mg total) by mouth every 8 (eight) hours as needed.   ibuprofen  (ADVIL ) 800 MG tablet Take 1 tablet (800 mg total) by mouth every 8 (eight) hours as needed.   levonorgestrel  (MIRENA ) 20 MCG/DAY IUD 1 each by Intrauterine route once.   No facility-administered medications prior to visit.    Review of Systems {Insert previous labs  (optional):23779} {See past labs  Heme  Chem  Endocrine  Serology  Results Review (optional):1}  Objective    There were no vitals taken for this visit. {Insert last BP/Wt (optional):23777}{See vitals history (optional):1}  Physical Exam   No results found for any visits on 04/06/24.  Assessment & Plan    Routine Health Maintenance and Physical Exam  Exercise Activities and Dietary recommendations  Goals   None     Immunization History  Administered Date(s) Administered   Tdap 08/07/2012, 02/20/2020    Health Maintenance  Topic Date Due   Pneumococcal Vaccine 27-38 Years old (1 of 2 - PCV) Never done   Hepatitis B Vaccines (1 of 3 - 19+ 3-dose series) Never done   HPV VACCINES (1 - 3-dose SCDM series) Never done   COVID-19 Vaccine (1 - 2024-25 season) Never done   INFLUENZA VACCINE  04/07/2024   Cervical Cancer Screening (HPV/Pap Cotest)  04/04/2028   DTaP/Tdap/Td (3 - Td or Tdap) 02/19/2030   Hepatitis C Screening  Completed   HIV Screening  Completed   Meningococcal B Vaccine  Aged Out    Discussed health benefits of physical activity, and encouraged her to engage in regular exercise appropriate for her age and condition.  Problem List Items Addressed This Visit   None   Assessment and Plan  No follow-ups on file.     Jon Eva, MD  [facility] [phone] (phone) [phone] (fax)  [facility]

## 2024-05-15 ENCOUNTER — Encounter: Payer: Self-pay | Admitting: Family Medicine

## 2024-05-15 ENCOUNTER — Ambulatory Visit (INDEPENDENT_AMBULATORY_CARE_PROVIDER_SITE_OTHER): Admitting: Family Medicine

## 2024-05-15 VITALS — BP 116/67 | HR 67 | Ht 65.5 in | Wt 219.3 lb

## 2024-05-15 DIAGNOSIS — F4323 Adjustment disorder with mixed anxiety and depressed mood: Secondary | ICD-10-CM

## 2024-05-15 DIAGNOSIS — M25562 Pain in left knee: Secondary | ICD-10-CM | POA: Diagnosis not present

## 2024-05-15 DIAGNOSIS — Z789 Other specified health status: Secondary | ICD-10-CM

## 2024-05-15 DIAGNOSIS — G8929 Other chronic pain: Secondary | ICD-10-CM

## 2024-05-15 DIAGNOSIS — E669 Obesity, unspecified: Secondary | ICD-10-CM | POA: Diagnosis not present

## 2024-05-15 DIAGNOSIS — Z0000 Encounter for general adult medical examination without abnormal findings: Secondary | ICD-10-CM

## 2024-05-15 MED ORDER — ESCITALOPRAM OXALATE 10 MG PO TABS: 10.0000 mg | ORAL_TABLET | Freq: Every day | ORAL | 1 refills | Status: AC

## 2024-05-15 MED ORDER — HYDROXYZINE PAMOATE 25 MG PO CAPS: 25.0000 mg | ORAL_CAPSULE | Freq: Three times a day (TID) | ORAL | 1 refills | Status: AC | PRN

## 2024-05-15 MED ORDER — CELECOXIB 100 MG PO CAPS: 100.0000 mg | ORAL_CAPSULE | Freq: Two times a day (BID) | ORAL | 1 refills | Status: AC

## 2024-05-15 NOTE — Assessment & Plan Note (Signed)
 Discussed importance of healthy weight management Discussed diet and exercise

## 2024-05-15 NOTE — Addendum Note (Signed)
 Addended by: LILIAN SEVERO RAMAN on: 05/15/2024 05:03 PM   Modules accepted: Orders
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
## 2024-05-15 NOTE — Assessment & Plan Note (Signed)
 Depression and anxiety previously well-managed on Lexapro  10 mg. She has been off medication for 1-2 months due to refill issues. No significant side effects reported while on medication. Plans to resume Lexapro  as it was effective previously. - Prescribe Lexapro  10 mg for depression and anxiety management

## 2024-05-16 ENCOUNTER — Ambulatory Visit: Payer: Self-pay | Admitting: Family Medicine

## 2024-05-16 LAB — COMPREHENSIVE METABOLIC PANEL WITH GFR
ALT: 11 IU/L (ref 0–32)
AST: 13 IU/L (ref 0–40)
Albumin: 4.3 g/dL (ref 3.9–4.9)
Alkaline Phosphatase: 68 IU/L (ref 44–121)
BUN/Creatinine Ratio: 16 (ref 9–23)
BUN: 11 mg/dL (ref 6–20)
Bilirubin Total: 1.2 mg/dL (ref 0.0–1.2)
CO2: 28 mmol/L (ref 20–29)
Calcium: 9.5 mg/dL (ref 8.7–10.2)
Chloride: 103 mmol/L (ref 96–106)
Creatinine, Ser: 0.7 mg/dL (ref 0.57–1.00)
Globulin, Total: 2.3 g/dL (ref 1.5–4.5)
Glucose: 84 mg/dL (ref 70–99)
Potassium: 4.1 mmol/L (ref 3.5–5.2)
Sodium: 140 mmol/L (ref 134–144)
Total Protein: 6.6 g/dL (ref 6.0–8.5)
eGFR: 118 mL/min/1.73 (ref >59)

## 2024-05-16 LAB — LIPID PANEL
Chol/HDL Ratio: 3.7 ratio (ref 0.0–4.4)
Cholesterol, Total: 147 mg/dL (ref 100–199)
HDL: 40 mg/dL (ref >39)
LDL Chol Calc (NIH): 88 mg/dL (ref 0–99)
Triglycerides: 102 mg/dL (ref 0–149)
VLDL Cholesterol Cal: 19 mg/dL (ref 5–40)

## 2024-05-16 LAB — CBC WITH DIFFERENTIAL/PLATELET
Basophils Absolute: 0.1 x10E3/uL (ref 0.0–0.2)
Basos: 1 %
EOS (ABSOLUTE): 0.2 x10E3/uL (ref 0.0–0.4)
Eos: 2 %
Hematocrit: 42.5 % (ref 34.0–46.6)
Hemoglobin: 14.1 g/dL (ref 11.1–15.9)
Immature Grans (Abs): 0 x10E3/uL (ref 0.0–0.1)
Immature Granulocytes: 0 %
Lymphocytes Absolute: 1.9 x10E3/uL (ref 0.7–3.1)
Lymphs: 20 %
MCH: 29.5 pg (ref 26.6–33.0)
MCHC: 33.2 g/dL (ref 31.5–35.7)
MCV: 89 fL (ref 79–97)
Monocytes Absolute: 0.5 x10E3/uL (ref 0.1–0.9)
Monocytes: 5 %
Neutrophils Absolute: 7 x10E3/uL (ref 1.4–7.0)
Neutrophils: 72 %
Platelets: 310 x10E3/uL (ref 150–450)
RBC: 4.78 x10E6/uL (ref 3.77–5.28)
RDW: 11.9 % (ref 11.7–15.4)
WBC: 9.7 x10E3/uL (ref 3.4–10.8)

## 2024-05-16 LAB — HEPATITIS B SURFACE ANTIBODY,QUALITATIVE: Hep B Surface Ab, Qual: NONREACTIVE

## 2024-05-23 ENCOUNTER — Other Ambulatory Visit: Payer: Self-pay

## 2024-05-23 DIAGNOSIS — M25562 Pain in left knee: Secondary | ICD-10-CM

## 2024-05-25 ENCOUNTER — Ambulatory Visit
Admission: RE | Admit: 2024-05-25 | Discharge: 2024-05-25 | Disposition: A | Discharge status: Home / Self Care | Source: Ambulatory Visit

## 2024-05-25 ENCOUNTER — Ambulatory Visit (INDEPENDENT_AMBULATORY_CARE_PROVIDER_SITE_OTHER)

## 2024-05-25 ENCOUNTER — Ambulatory Visit: Admission: RE | Admit: 2024-05-25 | Discharge: 2024-05-25 | Disposition: A | Discharge status: Home / Self Care

## 2024-05-25 DIAGNOSIS — M7632 Iliotibial band syndrome, left leg: Secondary | ICD-10-CM | POA: Diagnosis not present

## 2024-05-25 DIAGNOSIS — M25562 Pain in left knee: Secondary | ICD-10-CM

## 2024-05-25 DIAGNOSIS — M7652 Patellar tendinitis, left knee: Secondary | ICD-10-CM | POA: Diagnosis not present
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
## 2024-05-26 NOTE — Addendum Note (Signed)
 Addended by: VANDERBILT LIONEL CROME on: 05/26/2024 02:47 PM   Modules accepted: Orders

## 2024-07-06 ENCOUNTER — Ambulatory Visit

## 2024-07-07 ENCOUNTER — Telehealth: Payer: Self-pay | Admitting: Physician Assistant

## 2024-07-07 NOTE — Telephone Encounter (Signed)
 Called patient. Patient missed scheduled 6-week follow-up. Patient states she is feeling much better. Occasional knee pain. States knee brace has provided the most relief. Not really using OTC Tylenol  or ibuprofen  (other than for headache or back pain). Patient did not attend physical therapy; felt she did not need it. Advised patient to call [facility] or schedule follow-up appointment if symptoms return or any new symptoms develop. Patient agreed. Patient denied any questions or concerns.

## 2024-07-10 ENCOUNTER — Encounter: Payer: Self-pay | Admitting: Radiology

## 2025-05-17 ENCOUNTER — Encounter: Admitting: Family Medicine
